# Patient Record
Sex: Female | Born: 1960
Health system: Southern US, Community
[De-identification: ages and names within clinical notes are randomized; demographics above are authoritative.]

## PROBLEM LIST (undated history)

## (undated) DIAGNOSIS — R7303 Prediabetes: Secondary | ICD-10-CM

## (undated) DIAGNOSIS — J45909 Unspecified asthma, uncomplicated: Secondary | ICD-10-CM

## (undated) DIAGNOSIS — M797 Fibromyalgia: Secondary | ICD-10-CM

## (undated) DIAGNOSIS — D508 Other iron deficiency anemias: Secondary | ICD-10-CM

## (undated) DIAGNOSIS — G4730 Sleep apnea, unspecified: Secondary | ICD-10-CM

## (undated) DIAGNOSIS — M5030 Other cervical disc degeneration, unspecified cervical region: Secondary | ICD-10-CM

## (undated) DIAGNOSIS — M810 Age-related osteoporosis without current pathological fracture: Secondary | ICD-10-CM

## (undated) DIAGNOSIS — M19041 Primary osteoarthritis, right hand: Secondary | ICD-10-CM

## (undated) DIAGNOSIS — M5417 Radiculopathy, lumbosacral region: Secondary | ICD-10-CM

## (undated) DIAGNOSIS — M25551 Pain in right hip: Secondary | ICD-10-CM

## (undated) DIAGNOSIS — M1120 Other chondrocalcinosis, unspecified site: Secondary | ICD-10-CM

## (undated) DIAGNOSIS — I10 Essential (primary) hypertension: Secondary | ICD-10-CM

## (undated) DIAGNOSIS — E042 Nontoxic multinodular goiter: Secondary | ICD-10-CM

## (undated) DIAGNOSIS — M19042 Primary osteoarthritis, left hand: Secondary | ICD-10-CM

## (undated) DIAGNOSIS — E785 Hyperlipidemia, unspecified: Secondary | ICD-10-CM

## (undated) DIAGNOSIS — R609 Edema, unspecified: Secondary | ICD-10-CM

## (undated) DIAGNOSIS — D472 Monoclonal gammopathy: Secondary | ICD-10-CM

## (undated) DIAGNOSIS — M8580 Other specified disorders of bone density and structure, unspecified site: Secondary | ICD-10-CM

## (undated) DIAGNOSIS — K649 Unspecified hemorrhoids: Secondary | ICD-10-CM

## (undated) DIAGNOSIS — D649 Anemia, unspecified: Secondary | ICD-10-CM

## (undated) DIAGNOSIS — K909 Intestinal malabsorption, unspecified: Secondary | ICD-10-CM

## (undated) DIAGNOSIS — E049 Nontoxic goiter, unspecified: Secondary | ICD-10-CM

## (undated) DIAGNOSIS — M170 Bilateral primary osteoarthritis of knee: Secondary | ICD-10-CM

## (undated) DIAGNOSIS — M5136 Other intervertebral disc degeneration, lumbar region: Secondary | ICD-10-CM

## (undated) HISTORY — DX: Monoclonal gammopathy: D47.2

## (undated) HISTORY — DX: Primary osteoarthritis, right hand: M19.041

## (undated) HISTORY — DX: Pain in right hip: M25.551

## (undated) HISTORY — DX: Other cervical disc degeneration, unspecified cervical region: M50.30

## (undated) HISTORY — DX: Other intervertebral disc degeneration, lumbar region: M51.36

## (undated) HISTORY — DX: Other iron deficiency anemias: D50.8

## (undated) HISTORY — DX: Sleep apnea, unspecified: G47.30

## (undated) HISTORY — DX: Age-related osteoporosis without current pathological fracture: M81.0

## (undated) HISTORY — PX: OTHER SURGICAL HISTORY: SHX169

## (undated) HISTORY — DX: Bilateral primary osteoarthritis of knee: M17.0

## (undated) HISTORY — DX: Essential (primary) hypertension: I10

## (undated) HISTORY — DX: Edema, unspecified: R60.9

## (undated) HISTORY — DX: Other chondrocalcinosis, unspecified site: M11.20

## (undated) HISTORY — DX: Primary osteoarthritis, left hand: M19.042

## (undated) HISTORY — DX: Anemia, unspecified: D64.9

## (undated) HISTORY — DX: Nontoxic goiter, unspecified: E04.9

## (undated) HISTORY — DX: Radiculopathy, lumbosacral region: M54.17

## (undated) HISTORY — DX: Unspecified asthma, uncomplicated: J45.909

## (undated) HISTORY — DX: Prediabetes: R73.03

## (undated) HISTORY — DX: Unspecified hemorrhoids: K64.9

## (undated) HISTORY — DX: Other specified disorders of bone density and structure, unspecified site: M85.80

## (undated) HISTORY — DX: Intestinal malabsorption, unspecified: K90.9

## (undated) HISTORY — DX: Fibromyalgia: M79.7

## (undated) HISTORY — DX: Hyperlipidemia, unspecified: E78.5

---

## 1993-01-10 HISTORY — PX: TOTAL ABDOMINAL HYSTERECTOMY: SHX209

## 1997-06-09 ENCOUNTER — Other Ambulatory Visit: Admission: RE | Admit: 1997-06-09 | Discharge: 1997-06-09 | Payer: Self-pay | Admitting: Obstetrics and Gynecology

## 1998-08-04 ENCOUNTER — Other Ambulatory Visit: Admission: RE | Admit: 1998-08-04 | Discharge: 1998-08-04 | Payer: Self-pay | Admitting: Obstetrics and Gynecology

## 1999-08-23 ENCOUNTER — Other Ambulatory Visit: Admission: RE | Admit: 1999-08-23 | Discharge: 1999-08-23 | Payer: Self-pay | Admitting: Obstetrics and Gynecology

## 2000-10-09 ENCOUNTER — Other Ambulatory Visit: Admission: RE | Admit: 2000-10-09 | Discharge: 2000-10-09 | Payer: Self-pay | Admitting: Obstetrics and Gynecology

## 2001-11-20 ENCOUNTER — Other Ambulatory Visit: Admission: RE | Admit: 2001-11-20 | Discharge: 2001-11-20 | Payer: Self-pay | Admitting: Obstetrics and Gynecology

## 2002-12-12 ENCOUNTER — Other Ambulatory Visit: Admission: RE | Admit: 2002-12-12 | Discharge: 2002-12-12 | Payer: Self-pay | Admitting: Obstetrics and Gynecology

## 2003-12-24 ENCOUNTER — Other Ambulatory Visit: Admission: RE | Admit: 2003-12-24 | Discharge: 2003-12-24 | Payer: Self-pay | Admitting: Obstetrics and Gynecology

## 2005-01-18 ENCOUNTER — Other Ambulatory Visit: Admission: RE | Admit: 2005-01-18 | Discharge: 2005-01-18 | Payer: Self-pay | Admitting: Obstetrics and Gynecology

## 2008-05-10 LAB — HM COLONOSCOPY: HM Colonoscopy: NORMAL

## 2009-04-30 ENCOUNTER — Encounter: Admission: RE | Admit: 2009-04-30 | Discharge: 2009-06-11 | Payer: Self-pay | Admitting: Family Medicine

## 2009-06-10 ENCOUNTER — Encounter: Admission: RE | Admit: 2009-06-10 | Discharge: 2009-06-10 | Payer: Self-pay | Admitting: Sports Medicine

## 2009-06-10 ENCOUNTER — Encounter: Payer: Self-pay | Admitting: Family Medicine

## 2009-06-24 ENCOUNTER — Encounter: Payer: Self-pay | Admitting: Family Medicine

## 2009-07-10 HISTORY — PX: ESOPHAGOGASTRODUODENOSCOPY: SHX1529

## 2009-07-21 ENCOUNTER — Encounter: Payer: Self-pay | Admitting: Family Medicine

## 2009-07-29 ENCOUNTER — Encounter: Payer: Self-pay | Admitting: Family Medicine

## 2009-08-28 ENCOUNTER — Ambulatory Visit: Payer: Self-pay | Admitting: Family Medicine

## 2009-08-28 DIAGNOSIS — M25559 Pain in unspecified hip: Secondary | ICD-10-CM | POA: Insufficient documentation

## 2009-08-28 DIAGNOSIS — R7301 Impaired fasting glucose: Secondary | ICD-10-CM | POA: Insufficient documentation

## 2009-09-03 ENCOUNTER — Encounter: Payer: Self-pay | Admitting: Family Medicine

## 2009-09-04 LAB — CONVERTED CEMR LAB
ALT: 12 units/L (ref 0–35)
BUN: 13 mg/dL (ref 6–23)
Calcium: 8.9 mg/dL (ref 8.4–10.5)
Chloride: 103 meq/L (ref 96–112)
Creatinine, Ser: 0.78 mg/dL (ref 0.40–1.20)
Glucose, Bld: 111 mg/dL — ABNORMAL HIGH (ref 70–99)
LDL Cholesterol: 88 mg/dL (ref 0–99)
Potassium: 4.3 meq/L (ref 3.5–5.3)
Total Bilirubin: 0.7 mg/dL (ref 0.3–1.2)
Total CHOL/HDL Ratio: 4.2
Total Protein: 7.5 g/dL (ref 6.0–8.3)
Triglycerides: 258 mg/dL — ABNORMAL HIGH (ref <150)
VLDL: 52 mg/dL — ABNORMAL HIGH (ref 0–40)

## 2009-09-23 ENCOUNTER — Ambulatory Visit: Payer: Self-pay | Admitting: Family Medicine

## 2009-09-23 DIAGNOSIS — R609 Edema, unspecified: Secondary | ICD-10-CM | POA: Insufficient documentation

## 2009-09-24 ENCOUNTER — Encounter: Admission: RE | Admit: 2009-09-24 | Discharge: 2009-09-24 | Payer: Self-pay | Admitting: Family Medicine

## 2009-09-29 ENCOUNTER — Telehealth (INDEPENDENT_AMBULATORY_CARE_PROVIDER_SITE_OTHER): Payer: Self-pay | Admitting: *Deleted

## 2009-10-01 ENCOUNTER — Ambulatory Visit: Payer: Self-pay | Admitting: Family Medicine

## 2009-10-01 LAB — CONVERTED CEMR LAB
Bilirubin Urine: NEGATIVE
Glucose, Urine, Semiquant: NEGATIVE
Ketones, urine, test strip: NEGATIVE
Protein, U semiquant: NEGATIVE
pH: 5.5

## 2009-10-07 ENCOUNTER — Encounter: Payer: Self-pay | Admitting: Family Medicine

## 2009-10-07 DIAGNOSIS — M79609 Pain in unspecified limb: Secondary | ICD-10-CM | POA: Insufficient documentation

## 2009-10-09 ENCOUNTER — Encounter (INDEPENDENT_AMBULATORY_CARE_PROVIDER_SITE_OTHER): Payer: Self-pay | Admitting: *Deleted

## 2009-10-20 ENCOUNTER — Encounter: Payer: Self-pay | Admitting: Family Medicine

## 2009-12-01 ENCOUNTER — Ambulatory Visit: Payer: Self-pay | Admitting: Family Medicine

## 2009-12-30 ENCOUNTER — Ambulatory Visit: Payer: Self-pay | Admitting: Family Medicine

## 2010-02-11 NOTE — Assessment & Plan Note (Signed)
Summary: unilat leg edema   Vital Signs:  Patient profile:   50 year old female Height:      66.5 inches Weight:      220 pounds BMI:     35.10 O2 Sat:      96 % on Room air Pulse rate:   78 / minute BP sitting:   139 / 89  (left arm) Cuff size:   large  Vitals Entered By: Payton Spark CMA (September 23, 2009 1:20 PM)  O2 Flow:  Room air CC: F/U R hip pain and L ankle swelling   Primary Care Provider:  Seymour Bars DO  CC:  F/U R hip pain and L ankle swelling.  History of Present Illness: 50 yo AAF presents for f/u R hip pain which she has had evaluated with Dr Penni Bombard for already.  She has had some LBP pain and R groin pain since doing stretches over the weekend.  She had a bulding disc per her report on MRI recently.  She took some Tylenol which helped.    She started having L foot swelling  since the middle of August.  She initially thought it was from getting ready for a wedding.  She has some lower L calf pain.  She denies a previous injury or surgery to the LLE.  Swelling is worse at the end of the day.  She denies any swelling in the R foot (or maybe not as bad).      Current Medications (verified): 1)  Bystolic 20 Mg Tabs (Nebivolol Hcl) .... Take 1 Tab By Mouth Once Daily 2)  Amlodipine Besylate 5 Mg Tabs (Amlodipine Besylate) .... Take 1 Tab By Mouth Once Daily 3)  Trilipix 135 Mg Cpdr (Choline Fenofibrate) .... Take 1 Tab By Mouth Once Daily 4)  Furosemide 20 Mg Tabs (Furosemide) .... Take 1 Tab By Mouth Once Daily As Needed 5)  Anusol-Hc 25 Mg Supp (Hydrocortisone Acetate) .... Use As Directed Two Times A Day 6)  Estrace 0.1 Mg/gm Crea (Estradiol) .... Use As Directed 2 Times Weekly 7)  Aspirin 81 Mg Tbec (Aspirin) .... Take 1 Tab By Mouth Once Daily 8)  Vitron-C 125-200 Mg Tabs (Ferrous Fumarate-Vitamin C) .... Take 1 Tab By Mouth Once Daily 9)  Vitamin D 2000 Unit Tabs (Cholecalciferol) .... Take 1 Tab By Mouth Once Daily 10)  Calcium Carbonate Antacid 500 Mg  Chew (Calcium Carbonate Antacid) 11)  Stool Softener 100 Mg Caps (Docusate Sodium)  Allergies (verified): 1)  ! Sulfa 2)  ! Codeine 3)  ! Zithromax (Azithromycin)  Past History:  Past Medical History: Reviewed history from 08/28/2009 and no changes required. G3P2 gyn Dr Henderson Cloud IFG/ hx of GDM. high cholesterol HTN Anemia endometriosis  Social History: Reviewed history from 08/28/2009 and no changes required. 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 Never smoked. No regular exercise.  Review of Systems      See HPI  Physical Exam  General:  alert, well-developed, well-nourished, and well-hydrated.  obese Head:  normocephalic and atraumatic.   Mouth:  pharynx pink and moist.   Lungs:  Normal respiratory effort, chest expands symmetrically. Lungs are clear to auscultation, no crackles or wheezes. Heart:  Normal rate and regular rhythm. S1 and S2 normal without gallop, murmur, click, rub or other extra sounds. Pulses:  2+ pedal pulses Extremities:  LLE with 2+ pitting pedal edema, 1+ pitting edema up to the knee with 1+ Pitting edema over the R foot Skin:  color normal.   Psych:  good eye contact, not anxious appearing, and not depressed appearing.     Impression & Recommendations:  Problem # 1:  LEG EDEMA, LEFT (ICD-782.3) Concern for non traumatic unilateral LE edema.  R/O DVT (no risk factors) with a D Dimer today.  If normal, will look at other causes like venous insufficiency, gout, DJD.  Continue fluid meds, leg elevation and low sodium diet for now.   Her updated medication list for this problem includes:    Furosemide 20 Mg Tabs (Furosemide) .Marland Kitchen... Take 1 tab by mouth once daily as needed  Orders: T-D-Dimer Fibrin Derivatives Quantitive [phone])  Problem # 2:  HIP PAIN, RIGHT (ICD-719.45) Will get her notes from [place] [facility] and proceed with her referral to Dr Ethelene Hal since she has had MRI, PT and meds already. Her updated medication list  for this problem includes:    Aspirin 81 Mg Tbec (Aspirin) .Marland Kitchen... Take 1 tab by mouth once daily  Complete Medication List: 1)  Bystolic 20 Mg Tabs (Nebivolol hcl) .... Take 1 tab by mouth once daily 2)  Amlodipine Besylate 5 Mg Tabs (Amlodipine besylate) .... Take 1 tab by mouth once daily 3)  Trilipix 135 Mg Cpdr (Choline fenofibrate) .... Take 1 tab by mouth once daily 4)  Furosemide 20 Mg Tabs (Furosemide) .... Take 1 tab by mouth once daily as needed 5)  Anusol-hc 25 Mg Supp (Hydrocortisone acetate) .... Use as directed two times a day 6)  Estrace 0.1 Mg/gm Crea (Estradiol) .... Use as directed 2 times weekly 7)  Aspirin 81 Mg Tbec (Aspirin) .... Take 1 tab by mouth once daily 8)  Vitron-c 125-200 Mg Tabs (Ferrous fumarate-vitamin c) .... Take 1 tab by mouth once daily 9)  Vitamin D 2000 Unit Tabs (Cholecalciferol) .... Take 1 tab by mouth once daily 10)  Calcium Carbonate Antacid 500 Mg Chew (Calcium carbonate antacid) 11)  Stool Softener 100 Mg Caps (Docusate sodium)  Patient Instructions: 1)  Will try to get your records from [facility]. 2)  I will proceed with a referral to Dr Ethelene Hal for pain control in order to get you back into exercise. 3)  Check D - Dimer downstairs today. 4)  I will call you w/ results tonight. 5)  If it is + --> you will need to go to the hospital for DVT workup. 6)  Return for f/u in 8 wks to recheck fasting sugar/ lipids.

## 2010-02-11 NOTE — Op Note (Signed)
Summary: Right Hip Steroid Injection/[facility]  Right Hip Steroid Injection/[facility]   Imported By: Lanelle Bal 10/30/2009 15:48:42  _____________________________________________________________________  External Attachment:    Type:   Image     Comment:   External Document

## 2010-02-11 NOTE — Assessment & Plan Note (Signed)
Summary: Edema//mpm   Vital Signs:  Patient profile:   50 year old female Height:      66.5 inches Weight:      220 pounds BMI:     35.10 O2 Sat:      97 % on Room air Pulse rate:   78 / minute BP sitting:   120 / 77  (left arm) Cuff size:   large  Vitals Entered By: Payton Spark CMA (October 01, 2009 9:44 AM)  O2 Flow:  Room air CC: F/U edema   Primary Care Khylee Algeo:  Seymour Bars DO  CC:  F/U edema.  History of Present Illness: 50 yo WF presents for f/u LLE pain and swelling.  She had a neg w/u for DVT with a normal LE venous u/s.  her CMP was normal.  She has only trace RLE edema.  She is elevating her L foot which has been swollen for a month w/o trauma.  Her foot xray was normal..  She is voiding w/o problem.  She has pain over the anterior ankle joint with increased skin pigment and edema 1/3 the way up her leg.  Elevating her foot and trying to not walk on it much.  Not taking anything for pain.  She has yet to see Dr Ethelene Hal at [facility] ortho for R hip pain/ Lumbar buldging disc which is unchanged.  Allergies: 1)  ! Sulfa 2)  ! Codeine 3)  ! Zithromax (Azithromycin)  Past History:  Past Medical History: Reviewed history from 08/28/2009 and no changes required. G3P2 gyn Dr Henderson Cloud IFG/ hx of GDM. high cholesterol HTN Anemia endometriosis  Past Surgical History: Reviewed history from 08/28/2009 and no changes required. colpo x 2 EGD 7-11 for anemia colonoscopy 05-2008 normal TAH in 1995, bilat oophorectomy - endometriosis  Social History: Reviewed history from 08/28/2009 and no changes required. 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 No regular exercise.  Review of Systems      See HPI  Physical Exam  General:  alert, well-developed, well-nourished, well-hydrated, and overweight-appearing.   Head:  normocephalic and atraumatic.   Mouth:  pharynx pink and moist.   Neck:  no masses.   Lungs:  Normal respiratory  effort, chest expands symmetrically. Lungs are clear to auscultation, no crackles or wheezes. Heart:  Normal rate and regular rhythm. S1 and S2 normal without gallop, murmur, click, rub or other extra sounds. Msk:  neg R ankle anterior drawer test, neg talar tilt. point tender over the tibiotaler joint midline with increased skin hyperpigmentation.  antalgc gait with slightly limited R foot plantarflexion Pulses:  2+ pedal pulses Extremities:  trace pitting edema R foot and ankle, 2+ L foot pitting pedal edema with 1+ pitting edema 1/2 way up the leg. Skin:  no notable varicosities Inguinal Nodes:  No significant adenopathy   Impression & Recommendations:  Problem # 1:  LEG EDEMA, LEFT (ICD-782.3)  Unilateral Leg edema x 1 month.  Thus far, she has ruled out for a DVT, had a normal CMP and XRAY of the L foot.   She is having some pain over the tibiotalar joint with increased skin pigmentation suggesting possibly a missed stress fracture. Her inguinal LNs are normal.  will complete her lab w/u for edema today.  UA is neg for proteinuria.    Will get a bone scan to look for occult stress fx. Her updated medication list for this problem includes:    Furosemide 20 Mg Tabs (  Furosemide) .Marland Kitchen... Take 1 tab by mouth once daily as needed  Orders: UA Dipstick w/o Micro (automated)  (81003) T-Bone scan 3 phase study (70623)  Problem # 2:  HIP PAIN, RIGHT (ICD-719.45) I discussed her case with Dr Penni Bombard who saw her for this at [facility] ortho.  She is going to f/u with Dr Ethelene Hal as was their plan in June. Her updated medication list for this problem includes:    Aspirin 81 Mg Tbec (Aspirin) .Marland Kitchen... Take 1 tab by mouth once daily  Complete Medication List: 1)  Bystolic 20 Mg Tabs (Nebivolol hcl) .... Take 1 tab by mouth once daily 2)  Amlodipine Besylate 5 Mg Tabs (Amlodipine besylate) .... Take 1 tab by mouth once daily 3)  Trilipix 135 Mg Cpdr (Choline fenofibrate) .... Take 1 tab by mouth once  daily 4)  Furosemide 20 Mg Tabs (Furosemide) .... Take 1 tab by mouth once daily as needed 5)  Anusol-hc 25 Mg Supp (Hydrocortisone acetate) .... Use as directed two times a day 6)  Estrace 0.1 Mg/gm Crea (Estradiol) .... Use as directed 2 times weekly 7)  Aspirin 81 Mg Tbec (Aspirin) .... Take 1 tab by mouth once daily 8)  Vitron-c 125-200 Mg Tabs (Ferrous fumarate-vitamin c) .... Take 1 tab by mouth once daily 9)  Vitamin D 2000 Unit Tabs (Cholecalciferol) .... Take 1 tab by mouth once daily 10)  Calcium Carbonate Antacid 500 Mg Chew (Calcium carbonate antacid) 11)  Stool Softener 100 Mg Caps (Docusate sodium)  Other Orders: T-BNP  (B Natriuretic Peptide) ([id number]) T-TSH ([id number])  Patient Instructions: 1)  Will complete your labs today. 2)  Will call you w/ results tomorrow. 3)  Will order your bone scan and I will call you w/ the results. 4)  Will get you set up to see Dr Ethelene Hal. 5)  Elevate L foot, use ice packs and ace wraps for comfort.    Laboratory Results   Urine Tests    Routine Urinalysis   Color: yellow Appearance: Clear Glucose: negative   (Normal Range: Negative) Bilirubin: negative   (Normal Range: Negative) Ketone: negative   (Normal Range: Negative) Spec. Gravity: 1.020   (Normal Range: 1.003-1.035) Blood: trace-lysed   (Normal Range: Negative) pH: 5.5   (Normal Range: 5.0-8.0) Protein: negative   (Normal Range: Negative) Urobilinogen: 0.2   (Normal Range: 0-1) Nitrite: negative   (Normal Range: Negative) Leukocyte Esterace: trace   (Normal Range: Negative)

## 2010-02-11 NOTE — Miscellaneous (Signed)
Summary: bone scan: normal  Clinical Lists Changes  Problems: Added new problem of FOOT PAIN, LEFT (ICD-729.5) Orders: Added new Referral order of Podiatry Referral (Podiatry) - Signed Observations: Added new observation of BONE SCAN: Done at [facility] for L foot pain and swelling:  No abnormal uptake is noted on the 3 phase exam of both feet.  No abnormal uptake is noticed in the whole body delayed exam inclduing the R hip.   (10/07/2009 16:46)      Bone Scan  Procedure date:  10/07/2009  Findings:      Done at [facility] for L foot pain and swelling:  No abnormal uptake is noted on the 3 phase exam of both feet.  No abnormal uptake is noticed in the whole body delayed exam inclduing the R hip.     Bone Scan  Procedure date:  10/07/2009  Findings:      Done at [facility] for L foot pain and swelling:  No abnormal uptake is noted on the 3 phase exam of both feet.  No abnormal uptake is noticed in the whole body delayed exam inclduing the R hip.    Appended Document: bone scan: normal Pls let pt know that her BONE SCAN came back NORMAL.   I am going to proceed with a visit to the podiatrist.  Good news is that there was no sign of fracture, infection or cancer.  Seymour Bars, D.O.  Appended Document: bone scan: normal Pt aware of the above

## 2010-02-11 NOTE — Assessment & Plan Note (Signed)
Summary: URI   Vital Signs:  Patient profile:   50 year old female Height:      66.5 inches Weight:      226 pounds BMI:     36.06 O2 Sat:      97 % on Room air Temp:     98.4 degrees F oral Pulse rate:   78 / minute BP sitting:   134 / 80  (left arm) Cuff size:   large  Vitals Entered By: Payton Spark CMA (December 30, 2009 1:53 PM)  O2 Flow:  Room air CC: ? sinusitis. C/o congestion, HA, x 5 days.   Primary Care Provider:  Seymour Bars DO  CC:  ? sinusitis. C/o congestion, HA, and x 5 days.Marland Kitchen  History of Present Illness: 50 yo AAF presents for sore throat and nasal congestion that started 5 days ago.  Taking Coridan which did not help much until she took the one for chest congestion.  She is getting a lot of postnasal drip at night.  Denies chest tightness or SOB.  She has sinus pressure behind her nose and ear pressure with blowing nose.  No sick contacts.  No F/C.  Denies body aches, fatigue, or GI uspet.      Allergies: 1)  ! Sulfa 2)  ! Codeine 3)  ! Zithromax (Azithromycin)  Past History:  Past Medical History: Reviewed history from 12/01/2009 and no changes required. G3P2 gyn Dr Henderson Cloud IFG/ hx of GDM. high cholesterol HTN Anemia endometriosis chronic R hip pain -- Dr Charlann Boxer Dr Ethelene Hal LLE edema -- podiatry in [place]  Social History: Reviewed history from 08/28/2009 and no changes required. 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 No regular exercise.  Review of Systems      See HPI  Physical Exam  General:  alert, well-developed, well-nourished, and well-hydrated.   Head:  normocephalic and atraumatic.   Eyes:  conjunctiva clear Ears:  EACs patent; TMs translucent and gray with good cone of light and bony landmarks.  Nose:  boggy turbinates L > R without rhinorrhea Mouth:  o/p w/o injection, no tonsilar hypertrophy, exudates or vesicles,  clear postnasal drip Neck:  no masses.   Lungs:  Normal respiratory effort,  chest expands symmetrically. Lungs are clear to auscultation, no crackles or wheezes. Heart:  Normal rate and regular rhythm. S1 and S2 normal without gallop, murmur, click, rub or other extra sounds. Skin:  color normal.   Cervical Nodes:  No lymphadenopathy noted   Impression & Recommendations:  Problem # 1:  VIRAL URI (ICD-465.9) Use Coricidan HBP or plain Mucinex + nasal saline + Omnaris once daily (sample given). If not improving or sinus pain worsens over the w/e, go ahead and fill RX Amoxicllin (RX given since it is Xmas weekend).   Call if not feeling better after 10 days. Her updated medication list for this problem includes:    Aspirin 81 Mg Tbec (Aspirin) .Marland Kitchen... Take 1 tab by mouth once daily  Instructed on symptomatic treatment. Call if symptoms persist or worsen.   Complete Medication List: 1)  Bystolic 20 Mg Tabs (Nebivolol hcl) .... Take 1 tab by mouth once daily 2)  Amlodipine Besylate 5 Mg Tabs (Amlodipine besylate) .... Take 1 tab by mouth once daily 3)  Trilipix 135 Mg Cpdr (Choline fenofibrate) .... Take 1 tab by mouth once daily 4)  Furosemide 20 Mg Tabs (Furosemide) .... Take 1 tab by mouth once daily as needed 5)  Anusol-hc 25 Mg Supp (Hydrocortisone acetate) .... Use as directed two times a day 6)  Estrace 0.1 Mg/gm Crea (Estradiol) .... Use as directed 2 times weekly 7)  Aspirin 81 Mg Tbec (Aspirin) .... Take 1 tab by mouth once daily 8)  Vitron-c 125-200 Mg Tabs (Ferrous fumarate-vitamin c) .... Take 1 tab by mouth once daily 9)  Vitamin D 2000 Unit Tabs (Cholecalciferol) .... Take 1 tab by mouth once daily 10)  Calcium Carbonate Antacid 500 Mg Chew (Calcium carbonate antacid) 11)  Stool Softener 100 Mg Caps (Docusate sodium) 12)  Amoxicillin 875 Mg Tabs (Amoxicillin) .Marland Kitchen.. 1 tab by mouth two times a day x 10 days  Patient Instructions: 1)  Stay on Coricidan or use plain Mucinex for congestion. 2)  Use saline spray 3 x a day and use Omnaris 2 sprays/ nostril  once daily after use of nasal saline spray. 3)  If not improving by Fri Evening, go ahead and start the Amoxicllin for sinusitis.   Prescriptions: AMOXICILLIN 875 MG TABS (AMOXICILLIN) 1 tab by mouth two times a day x 10 days  #20 x 0   Entered and Authorized by:   Seymour Bars DO   Signed by:   Seymour Bars DO on 12/30/2009   Method used:   Printed then faxed to ...       [facility]  [address] [phone]* (retail)       [address]       [place], [place]  [address]       Ph: [phone] or [phone]       Fax: [phone]   RxID:   [id number]    Orders Added: 1)  Est. Patient Level III [36644]

## 2010-02-11 NOTE — Letter (Signed)
Summary: [facility]  [facility]   Imported By: Lanelle Bal 10/12/2009 11:38:13  _____________________________________________________________________  External Attachment:    Type:   Image     Comment:   External Document

## 2010-02-11 NOTE — Progress Notes (Signed)
Summary: LE venous u/s normal  Phone Note Outgoing Call   Summary of Call: Pls call pt and let her know that her venous doppler u/s of the legs came back normal. Schedule OV on MON to recheck edema and discuss further w/u. Initial call taken by: Seymour Bars DO,  September 29, 2009 10:21 AM  Follow-up for Phone Call        [facility] for Pt to CB Follow-up by: Payton Spark CMA,  September 29, 2009 10:24 AM  Additional Follow-up for Phone Call Additional follow up Details #1::        OV scheduled for Pt Additional Follow-up by: Payton Spark CMA,  September 29, 2009 1:48 PM

## 2010-02-11 NOTE — Letter (Signed)
Summary: [facility]  [facility]   Imported By: Lanelle Bal 10/12/2009 11:36:35  _____________________________________________________________________  External Attachment:    Type:   Image     Comment:   External Document

## 2010-02-11 NOTE — Letter (Signed)
Summary: [facility]  [facility]   Imported By: Lanelle Bal 10/12/2009 11:37:27  _____________________________________________________________________  External Attachment:    Type:   Image     Comment:   External Document

## 2010-02-11 NOTE — Assessment & Plan Note (Signed)
Summary: NOV hip pain   Vital Signs:  Patient profile:   50 year old female Height:      66.5 inches Weight:      219 pounds BMI:     34.94 O2 Sat:      98 % on Room air Pulse rate:   78 / minute BP sitting:   138 / 85  (left arm) Cuff size:   large  Vitals Entered By: Payton Spark CMA (August 28, 2009 2:53 PM)  O2 Flow:  Room air CC: New to est. R hip pain x months- worked up by Dr. Penni Bombard at [facility].    Primary Care Provider:  Seymour Bars DO  CC:  New to est. R hip pain x months- worked up by Dr. Penni Bombard at [facility]. .  History of Present Illness: 50 yo AAF presents for NOV.  She has had R hip pain for several mos.  Seen by Dr Penni Bombard for this and used oral Prednisone but it did not help much.  She saw him back and it was felt to be more LBP.  She had Xrays then an MRI of the R hip and L spine.  The R hip was 'normal' on MRI but the L spine was + for a bulding disc.  She started with PT but she thinks that this made her worse.  It was suggested that she see Dr Ethelene Hal.      Current Medications (verified): 1)  Bystolic 20 Mg Tabs (Nebivolol Hcl) .... Take 1 Tab By Mouth Once Daily 2)  Amlodipine Besylate 5 Mg Tabs (Amlodipine Besylate) .... Take 1 Tab By Mouth Once Daily 3)  Trilipix 135 Mg Cpdr (Choline Fenofibrate) .... Take 1 Tab By Mouth Once Daily 4)  Furosemide 20 Mg Tabs (Furosemide) .... Take 1 Tab By Mouth Once Daily As Needed 5)  Anusol-Hc 25 Mg Supp (Hydrocortisone Acetate) .... Use As Directed Two Times A Day 6)  Estrace 0.1 Mg/gm Crea (Estradiol) .... Use As Directed 2 Times Weekly 7)  Aspirin 81 Mg Tbec (Aspirin) .... Take 1 Tab By Mouth Once Daily 8)  Vitron-C 125-200 Mg Tabs (Ferrous Fumarate-Vitamin C) .... Take 1 Tab By Mouth Once Daily 9)  Vitamin D 2000 Unit Tabs (Cholecalciferol) .... Take 1 Tab By Mouth Once Daily 10)  Calcium Carbonate Antacid 500 Mg Chew (Calcium Carbonate Antacid) 11)  Stool Softener 100 Mg Caps (Docusate  Sodium)  Allergies (verified): 1)  ! Sulfa 2)  ! Codeine 3)  ! Zithromax (Azithromycin)  Past History:  Past Medical History: G3P2 gyn Dr Henderson Cloud IFG/ hx of GDM. 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 Married to [place]. Has 2 kids. Never smoked. No regular exercise.  Review of Systems       no fevers/sweats/weakness, unexplained wt loss/gain, no change in vision, no difficulty hearing, ringing in ears, no hay fever/allergies, no CP/discomfort, no palpitations, no breast lump/nipple discharge, no cough/wheeze, + blood in stool, no N/V/D,+ nocturia, no leaking urine, no unusual vag bleeding, no vaginal/penile discharge, no muscle/joint pain, no rash, no new/changing mole, no HA, no memory loss, no anxiety, no sleep problem, no depression, no unexplained lumps, no easy bruising/bleeding, no concern with sexual function   Physical Exam  General:  alert, well-developed, well-nourished, and well-hydrated.  obese Head:  normocephalic and atraumatic.   Eyes:  sclera non icteric conjunctival pallor Mouth:  pharynx pink and moist.   Neck:  no masses.   Lungs:  Normal respiratory effort, chest expands symmetrically. Lungs are clear to auscultation, no crackles or wheezes. Heart:  Normal rate and regular rhythm. S1 and S2 normal without gallop, murmur, click, rub or other extra sounds. Abdomen:  Bowel sounds positive,abdomen soft and non-tender without masses, organomegaly Msk:  full active and passive R hip and L spine ROM neg seated straight leg raise normal gait Neg FABER test. tender near the pubic symphysis Extremities:  no LE edema Skin:  color normal.     Impression & Recommendations:  Problem # 1:  HIP  PAIN, RIGHT (ICD-719.45) Chronic R hip pain.  Has already been seen, evaluated and treated at [facility] for this.  I will get her records.  Not sure if she's had imaging done on the L spine or pubic symphysis.  She does have the option to see Dr Ethelene Hal as Dr Penni Bombard recommended this.  She feels like she got worse from PT but she does need to get more active and work on wt loss w/o further injury.   Her updated medication list for this problem includes:    Aspirin 81 Mg Tbec (Aspirin) .Marland Kitchen... Take 1 tab by mouth once daily  Problem # 2:  IMPAIRED FASTING GLUCOSE (ICD-790.21) Will update her fasting labs. Orders: T-Hemoglobin A1C ([id number])  Complete Medication List: 1)  Bystolic 20 Mg Tabs (Nebivolol hcl) .... Take 1 tab by mouth once daily 2)  Amlodipine Besylate 5 Mg Tabs (Amlodipine besylate) .... Take 1 tab by mouth once daily 3)  Trilipix 135 Mg Cpdr (Choline fenofibrate) .... Take 1 tab by mouth once daily 4)  Furosemide 20 Mg Tabs (Furosemide) .... Take 1 tab by mouth once daily as needed 5)  Anusol-hc 25 Mg Supp (Hydrocortisone acetate) .... Use as directed two times a day 6)  Estrace 0.1 Mg/gm Crea (Estradiol) .... Use as directed 2 times weekly 7)  Aspirin 81 Mg Tbec (Aspirin) .... Take 1 tab by mouth once daily 8)  Vitron-c 125-200 Mg Tabs (Ferrous fumarate-vitamin c) .... Take 1 tab by mouth once daily 9)  Vitamin D 2000 Unit Tabs (Cholecalciferol) .... Take 1 tab by mouth once daily 10)  Calcium Carbonate Antacid 500 Mg Chew (Calcium carbonate antacid) 11)  Stool Softener 100 Mg Caps (Docusate sodium)  Other Orders: T-Comprehensive Metabolic Panel ([id number]) T-Lipid Profile ([id number])  Patient Instructions: 1)  Update fasting labs downstairs. 2)  Will call you w/ results. 3)  I will get your results from [facility] and let you know what our plan is. 4)  Return for f/u anemia/ pelvic pain in 6 wks.

## 2010-02-11 NOTE — Letter (Signed)
Summary: Primary Care Consult Scheduled Letter  [facility] [place]  [address]   [place], [place] [address]   Phone: [phone]  Fax: [phone]      10/09/2009 MRN: [id number]  [facility] [address] [place], [place]  [address]    Dear Ms. [name],    We have scheduled an appointment for you.  At the recommendation of Dr.Bowen, we have scheduled you a consult with [facility] on Thursday 10/15/09 at 10:45.  Their address is [address] [place] [address]. The office phone number is [phone].  If this appointment day and time is not convenient for you, please feel free to call the office of the doctor you are being referred to at the number listed above and reschedule the appointment.     It is important for you to keep your scheduled appointments. We are here to make sure you are given good patient care.     Thank you, Michaelle Copas [phone] Patient Care Coordinator [facility] Kathryne Sharper

## 2010-02-11 NOTE — Assessment & Plan Note (Signed)
Summary: f/u LLE edema   Vital Signs:  Patient profile:   50 year old female Height:      66.5 inches Weight:      225 pounds BMI:     35.90 O2 Sat:      99 % on Room air Temp:     98.0 degrees F oral Pulse rate:   81 / minute BP sitting:   127 / 74  (left arm) Cuff size:   large  Vitals Entered By: Payton Spark CMA (December 01, 2009 1:59 PM)  O2 Flow:  Room air CC: F/U. Swelling better.    Primary Care Provider:  Seymour Bars DO  CC:  F/U. Swelling better. Marland Kitchen  History of Present Illness: 50 yo AAF presents for f/u L foot swelling which has improved with use of compression hose as prescribed by a podiatrist in [place].  She has additional xyrays done but she just had mild DJD.  Using Diclofenac as needed.  She denies any L foot pain.  She has had a w/u for unilateral swelling that was all neg.    she saw Dr Ethelene Hal for her R hip and she had an injection done but this did not really help.  She does have f/u with him and did see Dr Charlann Boxer  who she has f/u with.  She has yet to start exercising and c/o muslce cramps all over.  She is using Furosemide about 2 x  amonth only.  Allergies: 1)  ! Sulfa 2)  ! Codeine 3)  ! Zithromax (Azithromycin)  Past History:  Past Medical History: G3P2 gyn Dr Henderson Cloud IFG/ hx of GDM. high cholesterol HTN Anemia endometriosis chronic R hip pain -- Dr Charlann Boxer Dr Ethelene Hal LLE edema -- podiatry in [facility]  Past Surgical History: Reviewed history from 08/28/2009 and no changes required. colpo x 2 EGD 7-11 for anemia colonoscopy 05-2008 normal TAH in 1995, bilat oophorectomy - endometriosis  Social History: Reviewed history from 08/28/2009 and no changes required. 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 Never smoked. No regular exercise.  Review of Systems      See HPI  Physical Exam  General:  alert, well-developed, well-nourished, and well-hydrated.  obese Eyes:  pupils equal, pupils round, and pupils reactive to light.     Mouth:  pharynx pink and moist.   Neck:  no masses.   Lungs:  Normal respiratory effort, chest expands symmetrically. Lungs are clear to auscultation, no crackles or wheezes. Heart:  Normal rate and regular rhythm. S1 and S2 normal without gallop, murmur, click, rub or other extra sounds. Msk:  no joint tenderness, no joint swelling, and no joint warmth.   Extremities:  trace bilat LE edema, wearing compression stockings Skin:  color normal.   Psych:  flat affect.     Impression & Recommendations:  Problem # 1:  LEG EDEMA, LEFT (ICD-782.3) Improved with use of compression stocking with rare use of furosemide.  Thus far, she had a neg w/u for DVT, a normal CMP, a normal L foot xray and bone scan, a normal UA.  Now seeing podiatry and she appears to have mild arthritis and some venous insuff likely weight - related. She is to continue NSAIDs for OA pain and compression hose.   Her updated medication list for this problem includes:    Furosemide 20 Mg Tabs (Furosemide) .Marland Kitchen... Take 1 tab by mouth once daily as needed  Problem # 2:  HIP PAIN, RIGHT (ICD-719.45) Has  f/u with Dr Charlann Boxer.  Unchanged. Her updated medication list for this problem includes:    Aspirin 81 Mg Tbec (Aspirin) .Marland Kitchen... Take 1 tab by mouth once daily  Problem # 3:  IMPAIRED FASTING GLUCOSE (ICD-790.21) We discussed healthy diet, regular exercise, wt loss. I will give her 3 mos before we recheck her sugar here.  Complete Medication List: 1)  Bystolic 20 Mg Tabs (Nebivolol hcl) .... Take 1 tab by mouth once daily 2)  Amlodipine Besylate 5 Mg Tabs (Amlodipine besylate) .... Take 1 tab by mouth once daily 3)  Trilipix 135 Mg Cpdr (Choline fenofibrate) .... Take 1 tab by mouth once daily 4)  Furosemide 20 Mg Tabs (Furosemide) .... Take 1 tab by mouth once daily as needed 5)  Anusol-hc 25 Mg Supp (Hydrocortisone acetate) .... Use as directed two times a day 6)  Estrace 0.1 Mg/gm Crea (Estradiol) .... Use as directed 2 times  weekly 7)  Aspirin 81 Mg Tbec (Aspirin) .... Take 1 tab by mouth once daily 8)  Vitron-c 125-200 Mg Tabs (Ferrous fumarate-vitamin c) .... Take 1 tab by mouth once daily 9)  Vitamin D 2000 Unit Tabs (Cholecalciferol) .... Take 1 tab by mouth once daily 10)  Calcium Carbonate Antacid 500 Mg Chew (Calcium carbonate antacid) 11)  Stool Softener 100 Mg Caps (Docusate sodium)  Patient Instructions: 1)  Let's work on healthy - LOW SUGAR/ LOW CARB DIET with regular exercise.   2)  Avoid eating out > 1 x a wk to cut back on sodium. 3)  Return in 3 mos to repeat fasting sugar and recheck WT.   Orders Added: 1)  Est. Patient Level IV [62130]

## 2010-02-23 ENCOUNTER — Encounter: Payer: Self-pay | Admitting: Family Medicine

## 2010-02-23 ENCOUNTER — Ambulatory Visit (INDEPENDENT_AMBULATORY_CARE_PROVIDER_SITE_OTHER): Payer: Commercial Managed Care - PPO | Admitting: Family Medicine

## 2010-02-23 DIAGNOSIS — R609 Edema, unspecified: Secondary | ICD-10-CM

## 2010-02-23 DIAGNOSIS — R290 Tetany: Secondary | ICD-10-CM | POA: Insufficient documentation

## 2010-02-23 DIAGNOSIS — I10 Essential (primary) hypertension: Secondary | ICD-10-CM | POA: Insufficient documentation

## 2010-02-23 DIAGNOSIS — R7301 Impaired fasting glucose: Secondary | ICD-10-CM

## 2010-02-24 ENCOUNTER — Encounter: Payer: Self-pay | Admitting: Family Medicine

## 2010-02-24 LAB — CONVERTED CEMR LAB
Chloride: 101 meq/L (ref 96–112)
Creatinine, Ser: 0.87 mg/dL (ref 0.40–1.20)
Glucose, Bld: 148 mg/dL — ABNORMAL HIGH (ref 70–99)
Potassium: 3.9 meq/L (ref 3.5–5.3)
Sodium: 139 meq/L (ref 135–145)

## 2010-03-03 NOTE — Assessment & Plan Note (Signed)
Summary: f/u BP   Vital Signs:  Patient profile:   50 year old female Height:      66.5 inches Weight:      226 pounds BMI:     36.06 O2 Sat:      98 % on Room air Pulse rate:   77 / minute BP sitting:   158 / 87  (left arm) Cuff size:   large  Vitals Entered By: Payton Spark CMA (February 23, 2010 3:11 PM)  O2 Flow:  Room air CC: F/U. discuss amlodipine    Primary Care Provider:  Seymour Bars DO  CC:  F/U. discuss amlodipine .  History of Present Illness: 50 yo AAF presents for f/u HTN since stopping amlodopine due to muscle spasms in legs which have resolved for the most part though she continues to have carpopedal spasms off the med  x 2 wks and w/o use of Lasix.    She reports having adverse SEs from Benicar and HCTZ in the past also but no true allergy.  Her leg swelling has improved by wearing compression hose and taking Lasix rarely.  Denies CP or DOE. She has just started to work on weight loss by improving diet, taking supplements but has yet to start exercising.  Current Medications (verified): 1)  Bystolic 20 Mg Tabs (Nebivolol Hcl) .... Take 1 Tab By Mouth Once Daily 2)  Amlodipine Besylate 5 Mg Tabs (Amlodipine Besylate) .... Take 1 Tab By Mouth Once Daily 3)  Trilipix 135 Mg Cpdr (Choline Fenofibrate) .... Take 1 Tab By Mouth Once Daily 4)  Furosemide 20 Mg Tabs (Furosemide) .... Take 1 Tab By Mouth Once Daily As Needed 5)  Anusol-Hc 25 Mg Supp (Hydrocortisone Acetate) .... Use As Directed Two Times A Day 6)  Estrace 0.1 Mg/gm Crea (Estradiol) .... Use As Directed 2 Times Weekly 7)  Aspirin 81 Mg Tbec (Aspirin) .... Take 1 Tab By Mouth Once Daily 8)  Vitron-C 125-200 Mg Tabs (Ferrous Fumarate-Vitamin C) .... Take 1 Tab By Mouth Once Daily 9)  Vitamin D 2000 Unit Tabs (Cholecalciferol) .... Take 1 Tab By Mouth Once Daily 10)  Calcium Carbonate Antacid 500 Mg Chew (Calcium Carbonate Antacid) 11)  Stool Softener 100 Mg Caps (Docusate Sodium) 12)  Amoxicillin  875 Mg Tabs (Amoxicillin) .Marland Kitchen.. 1 Tab By Mouth Two Times A Day X 10 Days  Allergies (verified): 1)  ! Sulfa 2)  ! Codeine 3)  ! Zithromax (Azithromycin) 4)  ! Amlodipine Besylate (Amlodipine Besylate)  Past History:  Past Medical History: Reviewed history from 12/01/2009 and no changes required. G3P2 gyn Dr Henderson Cloud IFG/ hx of GDM. high cholesterol HTN Anemia endometriosis chronic R hip pain -- Dr Charlann Boxer Dr Ethelene Hal LLE edema -- podiatry in [facility]  Past Surgical History: Reviewed history from 08/28/2009 and no changes required. colpo x 2 EGD 7-11 for anemia colonoscopy 05-2008 normal TAH in 1995, bilat oophorectomy - endometriosis  Family History: Reviewed history from 08/28/2009 and no changes required. mother high chol father bladder cancer 4 brothers DM, 1 sister DM  Social History: Reviewed history from 08/28/2009 and no changes required. 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 No regular exercise.  Review of Systems      See HPI  Physical Exam  General:  alert, well-developed, well-nourished, and well-hydrated.  obese Lungs:  Normal respiratory effort, chest expands symmetrically. Lungs are clear to auscultation, no crackles or wheezes. Heart:  Normal rate and regular rhythm. S1 and  S2 normal without gallop, murmur, click, rub or other extra sounds. Extremities:  trace LE edema Skin:  color normal.   Psych:  good eye contact, not anxious appearing, and not depressed appearing.     Impression & Recommendations:  Problem # 1:  ESSENTIAL HYPERTENSION, BENIGN (ICD-401.1) Assessment Deteriorated BP high off Amlodopine.  Due to adverse SEs, will replace with Micardis 40 mg/ day to start with.  Samples given.  RTC for nurse BP check in 3 wks.  Call if any problems.  Keep working on [employer], regular exercise, wt loss. The following medications were removed from the medication list:    Amlodipine Besylate 5 Mg Tabs (Amlodipine  besylate) .Marland Kitchen... Take 1 tab by mouth once daily    Furosemide 20 Mg Tabs (Furosemide) .Marland Kitchen... Take 1 tab by mouth once daily as needed Her updated medication list for this problem includes:    Bystolic 20 Mg Tabs (Nebivolol hcl) .Marland Kitchen... Take 1 tab by mouth once daily    Micardis 40 Mg Tabs (Telmisartan) .Marland Kitchen... 1 tab by mouth daily  BP today: 158/87 Prior BP: 134/80 (12/30/2009)  Labs Reviewed: K+: 4.3 (09/03/2009) Creat: : 0.78 (09/03/2009)   Chol: 184 (09/03/2009)   HDL: 44 (09/03/2009)   LDL: 88 (09/03/2009)   TG: 258 (09/03/2009)  Problem # 2:  CARPOPEDAL SPASM (ICD-781.7) Not on any regular diuretics.  Will check K+, Calcium and Mag level today. F/U results tomorrow.   Orders: T-Basic Metabolic Panel [phone]) T-Magnesium [id number]) T-Calcium  [phone])  Problem # 3:  PEDAL EDEMA (ICD-782.3) Improved.  rarely using Lasix.  She is wearing compression hose which have really helped. The following medications were removed from the medication list:    Furosemide 20 Mg Tabs (Furosemide) .Marland Kitchen... Take 1 tab by mouth once daily as needed  Problem # 4:  IMPAIRED FASTING GLUCOSE (ICD-790.21) REcheck at next visit.  Complete Medication List: 1)  Bystolic 20 Mg Tabs (Nebivolol hcl) .... Take 1 tab by mouth once daily 2)  Trilipix 135 Mg Cpdr (Choline fenofibrate) .... Take 1 tab by mouth once daily 3)  Anusol-hc 25 Mg Supp (Hydrocortisone acetate) .... Use as directed two times a day 4)  Estrace 0.1 Mg/gm Crea (Estradiol) .... Use as directed 2 times weekly 5)  Aspirin 81 Mg Tbec (Aspirin) .... Take 1 tab by mouth once daily 6)  Vitron-c 125-200 Mg Tabs (Ferrous fumarate-vitamin c) .... Take 1 tab by mouth once daily 7)  Vitamin D 2000 Unit Tabs (Cholecalciferol) .... Take 1 tab by mouth once daily 8)  Calcium Carbonate Antacid 500 Mg Chew (Calcium carbonate antacid) 9)  Stool Softener 100 Mg Caps (Docusate sodium) 10)  Micardis 40 Mg Tabs (Telmisartan) .Marland Kitchen.. 1 tab by mouth  daily  Patient Instructions: 1)  Stay on Bystolic daily. 2)  Add 1/2 tab of Micardis once daily. 3)  Check labs today. 4)  Return for nurse visit - BP check in 3 wks.   Orders Added: 1)  T-Basic Metabolic Panel [[id number]] 2)  T-Magnesium [[id number]] 3)  T-Calcium  [[id number]] 4)  Est. Patient Level IV [44034]

## 2010-03-05 ENCOUNTER — Ambulatory Visit: Payer: Self-pay | Admitting: Family Medicine

## 2010-03-16 ENCOUNTER — Ambulatory Visit (INDEPENDENT_AMBULATORY_CARE_PROVIDER_SITE_OTHER): Payer: Commercial Managed Care - PPO

## 2010-03-16 ENCOUNTER — Encounter: Payer: Self-pay | Admitting: Family Medicine

## 2010-03-16 DIAGNOSIS — I10 Essential (primary) hypertension: Secondary | ICD-10-CM

## 2010-03-19 ENCOUNTER — Encounter: Payer: Self-pay | Admitting: Family Medicine

## 2010-03-23 NOTE — Assessment & Plan Note (Signed)
Summary: injection - jr  Nurse Visit   Vital Signs:  Patient profile:   50 year old female Pulse rate:   83 / minute BP sitting:   163 / 90  (left arm) Cuff size:   large  Vitals Entered By: Payton Spark CMA (March 16, 2010 3:04 PM)  CC:  F/U BP.   Impression & Recommendations:  Problem # 1:  ESSENTIAL HYPERTENSION, BENIGN (ICD-401.1) BP stilll high .Will change to Micardist HCT. F/U in one month with Dr. Cathey Endow.  Her updated medication list for this problem includes:    Bystolic 20 Mg Tabs (Nebivolol hcl) .Marland Kitchen... Take 1 tab by mouth once daily    Micardis Hct 80-12.5 Mg Tabs (Telmisartan-hctz) .Marland Kitchen... Take 1 tablet by mouth once a day  Complete Medication List: 1)  Bystolic 20 Mg Tabs (Nebivolol hcl) .... Take 1 tab by mouth once daily 2)  Trilipix 135 Mg Cpdr (Choline fenofibrate) .... Take 1 tab by mouth once daily 3)  Anusol-hc 25 Mg Supp (Hydrocortisone acetate) .... Use as directed two times a day 4)  Estrace 0.1 Mg/gm Crea (Estradiol) .... Use as directed 2 times weekly 5)  Aspirin 81 Mg Tbec (Aspirin) .... Take 1 tab by mouth once daily 6)  Vitron-c 125-200 Mg Tabs (Ferrous fumarate-vitamin c) .... Take 1 tab by mouth once daily 7)  Vitamin D 2000 Unit Tabs (Cholecalciferol) .... Take 1 tab by mouth once daily 8)  Calcium Carbonate Antacid 500 Mg Chew (Calcium carbonate antacid) 9)  Stool Softener 100 Mg Caps (Docusate sodium) 10)  Micardis Hct 80-12.5 Mg Tabs (Telmisartan-hctz) .... Take 1 tablet by mouth once a day  CC: F/U BP   Allergies: 1)  ! Sulfa 2)  ! Codeine 3)  ! Zithromax (Azithromycin) 4)  ! Amlodipine Besylate (Amlodipine Besylate)  Orders Added: 1)  Est. Patient Level I [56213] Prescriptions: MICARDIS HCT 80-12.5 MG TABS (TELMISARTAN-HCTZ) Take 1 tablet by mouth once a day  #30 x 0   Entered by:   [name] MD   Authorized by:   Seymour Bars DO   Signed by:   [name] MD on 03/17/2010   Method used:   Electronically to   [facility]  [address] [phone] (retail)       [address]       [place], [place]  [address]       Ph: [phone] or [phone]       Fax: [phone]   RxID:   [id number]    CC: Follow-up HTN HPI: Taking meds? yes Side effects? no  Chest pain, SOB, Dizziness? Increased HAs A/P: HTN (401.1) At goal? If no, Patient will be notified.  5 minutes was spent with patient.   Appended Document: F/U BP LMOM for Pt to CB.Arvilla Market CMA, Michelle March 17, 2010 1:48 PM   Pt states she is unable to take Micardis HCTZ bc it gives her muscle spasms. Please advise. Arvilla Market CMA, Michelle March 17, 2010 4:14 PM   Stop the Micardis HCTZ and change to Carvedilol 1 tab twice daily for BP. Return for a nurse visit recheck BP in 3 wks.  Muscle spasms should resolve by then.  Seymour Bars, D.O.  Appended Document: injection - jr Pt aware of the above

## 2010-03-23 NOTE — Miscellaneous (Signed)
Summary: stop Carvedilol, already on bystolic  Clinical Lists Changes  Medications: Removed medication of CARVEDILOL 6.25 MG TABS (CARVEDILOL) 1 tab by mouth bid

## 2010-04-05 ENCOUNTER — Telehealth: Payer: Self-pay | Admitting: *Deleted

## 2010-04-05 NOTE — Telephone Encounter (Signed)
Pt states she is only taking Bystolic for BP and it is not bringing BP down to normal range. Her BP today was 148/96. Please advise.

## 2010-04-06 NOTE — Telephone Encounter (Signed)
We can add HCTZ 25 mg once daily OR she can schedule a nurse visit this wk.

## 2010-04-07 ENCOUNTER — Encounter: Payer: Self-pay | Admitting: Family Medicine

## 2010-04-07 NOTE — Telephone Encounter (Signed)
Pt can not take HCTZ so she will schedule OV w/ Dr. B for next week

## 2010-04-12 ENCOUNTER — Encounter: Payer: Self-pay | Admitting: Family Medicine

## 2010-04-12 ENCOUNTER — Ambulatory Visit (INDEPENDENT_AMBULATORY_CARE_PROVIDER_SITE_OTHER): Payer: Commercial Managed Care - PPO | Admitting: Family Medicine

## 2010-04-12 VITALS — BP 158/89 | HR 82 | Ht 66.0 in | Wt 227.0 lb

## 2010-04-12 DIAGNOSIS — I10 Essential (primary) hypertension: Secondary | ICD-10-CM

## 2010-04-12 LAB — POCT URINALYSIS DIPSTICK
Glucose, UA: NEGATIVE
Leukocytes, UA: NEGATIVE
Urobilinogen, UA: 0.2

## 2010-04-12 MED ORDER — HYDRALAZINE HCL 25 MG PO TABS: 25.0000 mg | ORAL_TABLET | Freq: Four times a day (QID) | ORAL | Status: DC

## 2010-04-12 NOTE — Progress Notes (Signed)
Addended by: Payton Spark on: 04/12/2010 03:49 PM   Modules accepted: Orders

## 2010-04-12 NOTE — Assessment & Plan Note (Signed)
UA normal today.  BP is high despite use of Bystolic since Sept 2011.  Labs UTD.  Likely related to obesity with BMI of 36.6.  Since she is not very motivated to make changes today, will add Hydralazine 25 mg qid, starting with 1/2 tab 3 x a day for the first wk.  Call if any problems.  Long term, needs to work on wt loss.  Consider a renal angiogram and a sleep study if not improving.  Has failed every other class of anti hypertensive drug.

## 2010-04-12 NOTE — Progress Notes (Signed)
  Subjective:    Patient ID: Donna Dyer, female    DOB: 1960/03/12, 50 y.o.   MRN: [id number]    HPI  50 yo AAF presents for f/u HTN.  She is taking Bystolic 20 mg once daily.  Her home BPs are 150's /90's.  She has been tried on HCTZ, Micardis, Amlodopine but had SEs from all of these.  She has been having some HAs but denies chest pain, SOB, palpitations or edema.  She saw Dr Kendra Opitz in [facility] and was diagnosed with lumbar radiculopathy.  Neurontin was started and she is doing PT.  She did have to stop the neurontin due to feeling dizzy.  Denies stress, anxiety or poor sleep.  She is taking ibuprofen 400 mg po bid for pain.    BP 158/89  Pulse 82  Ht 5\' 6"  (1.676 m)  Wt 227 lb (102.967 kg)  BMI 36.64 kg/m2  SpO2 95%  Review of Systems  Constitutional: Negative for fatigue and unexpected weight change.  Eyes: Negative for visual disturbance.  Respiratory: Negative for chest tightness and shortness of breath.   Cardiovascular: Negative for chest pain, palpitations and leg swelling.  Genitourinary: Negative for difficulty urinating.  Neurological: Positive for headaches. Negative for dizziness.  Psychiatric/Behavioral: Negative for dysphoric mood.       Objective:   Physical Exam  Constitutional: She appears well-developed and well-nourished. No distress.  Eyes: Pupils are equal, round, and reactive to light.  Neck: Normal range of motion. Neck supple. No JVD present. No thyromegaly present.  Cardiovascular: Normal rate, regular rhythm and normal heart sounds.   No murmur heard. Pulmonary/Chest: Effort normal and breath sounds normal.  Musculoskeletal: She exhibits edema (1+ pitting bilat LE edema).  Skin: Skin is warm and dry.  Psychiatric: She has a normal mood and affect.          Assessment & Plan:

## 2010-04-12 NOTE — Patient Instructions (Signed)
Stay on Bystolic. Add HYDRALAZINE-- START WITH 1/2 TAB 3 X A DAY FOR THE FIRST WEEK THEN INCREASE TO A FULL TAB 4 X A DAY (breakfast, lunch, dinner, bedtime).  Call if any problems.  Return for a nurse visit BP check in 3 wks.

## 2010-05-03 ENCOUNTER — Ambulatory Visit (INDEPENDENT_AMBULATORY_CARE_PROVIDER_SITE_OTHER): Payer: Commercial Managed Care - PPO | Admitting: Family Medicine

## 2010-05-03 ENCOUNTER — Encounter: Payer: Self-pay | Admitting: Family Medicine

## 2010-05-03 VITALS — BP 128/73 | HR 77

## 2010-05-03 DIAGNOSIS — I10 Essential (primary) hypertension: Secondary | ICD-10-CM

## 2010-05-03 MED ORDER — HYDRALAZINE HCL 25 MG PO TABS: 25.0000 mg | ORAL_TABLET | Freq: Four times a day (QID) | ORAL | Status: DC

## 2010-05-03 NOTE — Progress Notes (Signed)
  Subjective:    Patient ID: Donna Dyer, female    DOB: 09-10-1960, 50 y.o.   MRN: [id number]  HPI Pt denies chest pain, SOB, dizziness, or heart palpitations.  Taking meds as directed w/o problems.  Denies medication side effects.  5 min spent with pt.     Review of Systems     Objective:   Physical Exam        Assessment & Plan:

## 2010-07-04 ENCOUNTER — Other Ambulatory Visit: Payer: Self-pay | Admitting: Family Medicine

## 2010-07-04 LAB — LIPID PANEL: HDL: 60 mg/dL (ref 35–70)

## 2010-08-20 ENCOUNTER — Encounter: Payer: Self-pay | Admitting: Family Medicine

## 2010-08-20 ENCOUNTER — Ambulatory Visit (INDEPENDENT_AMBULATORY_CARE_PROVIDER_SITE_OTHER): Payer: Commercial Managed Care - PPO | Admitting: Family Medicine

## 2010-08-20 DIAGNOSIS — L299 Pruritus, unspecified: Secondary | ICD-10-CM

## 2010-08-20 DIAGNOSIS — Z131 Encounter for screening for diabetes mellitus: Secondary | ICD-10-CM

## 2010-08-20 DIAGNOSIS — I10 Essential (primary) hypertension: Secondary | ICD-10-CM

## 2010-08-20 MED ORDER — TRIAMCINOLONE ACETONIDE 0.5 % EX CREA: TOPICAL_CREAM | Freq: Three times a day (TID) | CUTANEOUS | Status: DC

## 2010-08-20 MED ORDER — NEBIVOLOL HCL 20 MG PO TABS: 1.0000 | ORAL_TABLET | Freq: Every day | ORAL | Status: DC

## 2010-08-20 MED ORDER — HYDRALAZINE HCL 25 MG PO TABS: 25.0000 mg | ORAL_TABLET | Freq: Four times a day (QID) | ORAL | Status: DC

## 2010-08-20 NOTE — Patient Instructions (Signed)
Stay on Bystolic + Hydralazine for BP.  Use Triamcinolone cream 2-3 x a day on itchy skin for 10 days.  Labs today.  Will call you w/ results Monday.  If itching is not improving, will get Cards to see you for alternatives for BP.

## 2010-08-21 LAB — COMPLETE METABOLIC PANEL WITH GFR
AST: 24 U/L (ref 0–37)
Albumin: 4.1 g/dL (ref 3.5–5.2)
Alkaline Phosphatase: 113 U/L (ref 39–117)
BUN: 15 mg/dL (ref 6–23)
Calcium: 9.8 mg/dL (ref 8.4–10.5)
GFR, Est African American: >60 mL/min (ref >60)
GFR, Est Non African American: 59 mL/min — ABNORMAL LOW (ref >60)
Potassium: 4 mEq/L (ref 3.5–5.3)
Total Bilirubin: 0.5 mg/dL (ref 0.3–1.2)
Total Protein: 7.5 g/dL (ref 6.0–8.3)

## 2010-08-21 LAB — HEMOGLOBIN A1C
Hgb A1c MFr Bld: 6.9 % — ABNORMAL HIGH (ref <5.7)
Mean Plasma Glucose: 151 mg/dL — ABNORMAL HIGH (ref <117)

## 2010-08-23 ENCOUNTER — Telehealth: Payer: Self-pay | Admitting: Family Medicine

## 2010-08-23 ENCOUNTER — Encounter: Payer: Self-pay | Admitting: Family Medicine

## 2010-08-23 DIAGNOSIS — E119 Type 2 diabetes mellitus without complications: Secondary | ICD-10-CM | POA: Insufficient documentation

## 2010-08-23 MED ORDER — HYDRALAZINE HCL 25 MG PO TABS: 25.0000 mg | ORAL_TABLET | Freq: Four times a day (QID) | ORAL | Status: DC

## 2010-08-23 MED ORDER — NEBIVOLOL HCL 20 MG PO TABS: 1.0000 | ORAL_TABLET | Freq: Every day | ORAL | Status: DC

## 2010-08-23 MED ORDER — METFORMIN HCL 500 MG PO TABS: ORAL_TABLET | ORAL | Status: DC

## 2010-08-23 NOTE — Telephone Encounter (Signed)
Pls let pt know that her A1C came back 6.9 which is in the DIABETIC RANGE (>6.5).  Her chemistry panel came back normal.  I am going to start her on Metformin 500 mg with dinner and bring her back in 3 wks to get her started with checking AM fasting sugars with a meter and set her up with a nutritionist.  She definitely has the potential to turn this back to pre-diabetes but it will require diet, exercise and wt loss.  Needs the metformin now to get these sugars down.

## 2010-08-23 NOTE — Assessment & Plan Note (Addendum)
BP high today b/c pt stopped hydralazine on her own.  I truly doubt her pruritis is related to hydralazine and she definitely does not have a drug rash.  I will restart her hydralazine today and continue her Bystolic. Update labs.  F/u with cards if continuing to have problems since she has tried and 'failed' almost every single anti hypertensive.

## 2010-08-23 NOTE — Telephone Encounter (Signed)
Addended by: Ellsworth Lennox on: 08/23/2010 01:26 PM   Modules accepted: Orders

## 2010-08-23 NOTE — Telephone Encounter (Signed)
Pt aware of the above  

## 2010-08-23 NOTE — Progress Notes (Signed)
  Subjective:    Patient ID: Donna Dyer, female    DOB: 28-May-1960, 50 y.o.   MRN: [id number]  HPI  50 yo AAF presents for f/u HTN.  She had been on multiple different anti hypertensives thru the years and had adverse SEs to most.  I started her on Hydralazine in the Spring to get her BP under better control.  She had done well on it, taking 4 x a day but then developed itching skin on her arms mostly about a month ago and took herself off the hydralazine.  She is still itchy though.  Denies seeing a rash.  Has not used anything for her itchy skin.  Denies CP or DOE or palpitations or leg swelling.  She is doing well on Bystolic, which she is still taking.    BP 154/93  Pulse 82  Ht 5\' 6"  (1.676 m)  Wt 222 lb (100.699 kg)  BMI 35.83 kg/m2  SpO2 95%   Review of Systems as per HPI     Objective:   Physical Exam  Constitutional: She appears well-developed and well-nourished. No distress.       obese  HENT:  Mouth/Throat: Oropharynx is clear and moist.  Eyes: Pupils are equal, round, and reactive to light. No scleral icterus.  Neck: Neck supple. No thyromegaly present.  Cardiovascular: Normal rate, regular rhythm and normal heart sounds.   No murmur heard. Pulmonary/Chest: Effort normal and breath sounds normal.  Abdominal:       No HSM, soft, NT/ ND  Musculoskeletal: She exhibits no edema.  Skin: Skin is warm and dry. No rash noted.  Psychiatric: She has a normal mood and affect.          Assessment & Plan:  Pruritis- doubt this was from Hydralazine, so restarted.  RX for steroid cream given to use for 10 days.  Check LFTs with labs and f/u results.  If not improving, Pls call.

## 2010-09-27 ENCOUNTER — Telehealth: Payer: Self-pay | Admitting: Family Medicine

## 2010-09-27 ENCOUNTER — Ambulatory Visit (INDEPENDENT_AMBULATORY_CARE_PROVIDER_SITE_OTHER): Payer: Commercial Managed Care - PPO | Admitting: Family Medicine

## 2010-09-27 ENCOUNTER — Encounter: Payer: Self-pay | Admitting: Family Medicine

## 2010-09-27 VITALS — BP 154/93 | HR 74 | Wt 220.0 lb

## 2010-09-27 DIAGNOSIS — I10 Essential (primary) hypertension: Secondary | ICD-10-CM

## 2010-09-27 DIAGNOSIS — E119 Type 2 diabetes mellitus without complications: Secondary | ICD-10-CM

## 2010-09-27 DIAGNOSIS — Z23 Encounter for immunization: Secondary | ICD-10-CM

## 2010-09-27 DIAGNOSIS — E041 Nontoxic single thyroid nodule: Secondary | ICD-10-CM

## 2010-09-27 LAB — POCT UA - MICROALBUMIN

## 2010-09-27 MED ORDER — PNEUMOCOCCAL VAC POLYVALENT 25 MCG/0.5ML IJ INJ: 0.5000 mL | INJECTION | Freq: Once | INTRAMUSCULAR | Status: DC

## 2010-09-27 NOTE — Telephone Encounter (Signed)
Called [facility] and notified the ultrasound dept to run the ultrasound as thyroid u/s. Jarvis Newcomer, LPN Domingo Dimes

## 2010-09-27 NOTE — Telephone Encounter (Signed)
Pt had ordered a ultrasound soft tissue head/neck, and [facility] ultrasound calling stating it needs to be ordered as thyroid ultrasound.  Please advise. Plan:  Routed message to Dr. Linford Arnold for verification. Jarvis Newcomer, LPN Domingo Dimes

## 2010-09-27 NOTE — Progress Notes (Signed)
  Subjective:    Patient ID: Donna Dyer, female    DOB: 12/12/1960, 50 y.o.   MRN: [id number]  HPI DM- Has't been getting any regular exercises.  Did start pilates. Was doing zumba but them hurt her hip. She has been watching her diet. Has been borderline for years. She was supposed to start taking the metformin but never picked up the prescription.  HTN- Not well conrolled. She take her meds regularly. Has had more HA since being on hydralazine. Also taking bystolic.     Review of Systems     Objective:   Physical Exam  Constitutional: She is oriented to person, place, and time. She appears well-developed and well-nourished.  HENT:  Head: Normocephalic and atraumatic.  Cardiovascular: Normal rate, regular rhythm and normal heart sounds.   Pulmonary/Chest: Effort normal and breath sounds normal.  Neurological: She is alert and oriented to person, place, and time.  Skin: Skin is warm and dry.  Psychiatric: She has a normal mood and affect. Her behavior is normal.          Assessment & Plan:  DM- Metformin can cuase some loose stools. Asked her to start the medication and then f/u in 3 months. uirne micro performed today. Eye exam is up to dte. Due again in Dec.   HTN - Not well controlled. She has tried several different medications in the past. She does tolerate the diastolic well and is doing okay on hydralazine except she is getting more frequent headaches. At this point in time I discussed referring her to cardiology for further evaluation and to help manage her blood pressure regimen. She said she is open to this and in fact, Dr. Cathey Endow was planning on doing that if her blood pressure didn't respond to the hydralazine. She does remember being on verapamil in the past and she remembers tolerating it well but says it just never lower her blood pressure.  Pneumonia vaccine given today.

## 2010-09-27 NOTE — Telephone Encounter (Signed)
Yes, OK for thyroid US  ( our system calls it head and neck  With thyroid in parenthesis).  Can give them a verbal.

## 2010-09-28 ENCOUNTER — Other Ambulatory Visit: Payer: Self-pay | Admitting: Family Medicine

## 2010-09-29 ENCOUNTER — Telehealth: Payer: Self-pay | Admitting: *Deleted

## 2010-09-29 ENCOUNTER — Telehealth: Payer: Self-pay | Admitting: Family Medicine

## 2010-09-29 NOTE — Telephone Encounter (Signed)
Message copied by Lanae Crumbly on Wed Sep 29, 2010  1:34 PM ------      Message from: [name] D      Created: Tue Sep 28, 2010  6:43 PM       Thyroid looks great.

## 2010-09-29 NOTE — Telephone Encounter (Signed)
Called patient: Thyroid ultrasound is consistent with multinodular goiter. The appearance is stable from her prior exam and no changes which is very reassuring. Multinodular goiter is benign.

## 2010-09-29 NOTE — Telephone Encounter (Signed)
Pt notified of results. KJ LPN 

## 2010-09-29 NOTE — Telephone Encounter (Signed)
Pt notifeid of results. KJ LPN 

## 2010-09-29 NOTE — Telephone Encounter (Signed)
Message copied by Lanae Crumbly on Wed Sep 29, 2010  1:40 PM ------      Message from: [name] D      Created: Tue Sep 28, 2010  1:16 PM       Thyroid looks great.

## 2010-09-30 NOTE — Telephone Encounter (Signed)
Left message on vm

## 2010-10-05 ENCOUNTER — Telehealth: Payer: Self-pay | Admitting: Family Medicine

## 2010-10-05 NOTE — Telephone Encounter (Signed)
Pt called and said she received pneumo vaccine in the mail from [facility], but states she had already received the vaccine in the office.  Pt received vaccine in office on 09-27-10.  Verified. Plan:  Pt instructed to call the mail order and tell them they sent that to her in error.  Told pt to tell the mail order she rec in provider office on 09-27-10.  Pt told to call back if additional problems and can't get resolved. Jarvis Newcomer, LPN Domingo Dimes

## 2010-10-05 NOTE — Telephone Encounter (Signed)
Pt called [facility] and they told her they would send her return mail slip and she could send the vaccine back. Jarvis Newcomer, LPN Domingo Dimes

## 2010-10-06 DIAGNOSIS — R09A2 Foreign body sensation, throat: Secondary | ICD-10-CM | POA: Insufficient documentation

## 2010-10-06 DIAGNOSIS — R0989 Other specified symptoms and signs involving the circulatory and respiratory systems: Secondary | ICD-10-CM | POA: Insufficient documentation

## 2010-10-06 DIAGNOSIS — R198 Other specified symptoms and signs involving the digestive system and abdomen: Secondary | ICD-10-CM | POA: Insufficient documentation

## 2010-10-11 ENCOUNTER — Encounter: Payer: Self-pay | Admitting: Cardiology

## 2010-10-13 ENCOUNTER — Encounter: Payer: Self-pay | Admitting: Cardiology

## 2010-10-13 ENCOUNTER — Ambulatory Visit (INDEPENDENT_AMBULATORY_CARE_PROVIDER_SITE_OTHER): Payer: Commercial Managed Care - PPO | Admitting: Cardiology

## 2010-10-13 VITALS — BP 162/93 | HR 74 | Resp 18 | Ht 66.0 in | Wt 217.4 lb

## 2010-10-13 DIAGNOSIS — I10 Essential (primary) hypertension: Secondary | ICD-10-CM

## 2010-10-13 MED ORDER — LISINOPRIL-HYDROCHLOROTHIAZIDE 10-12.5 MG PO TABS: 1.0000 | ORAL_TABLET | Freq: Every day | ORAL | Status: DC

## 2010-10-13 NOTE — Assessment & Plan Note (Signed)
Management per primary care. 

## 2010-10-13 NOTE — Assessment & Plan Note (Addendum)
Blood pressure elevated. Patient also recently diagnosed with diabetes mellitus. Discontinue hydralazine. Began lisinopril HCT 10/12.5 mg daily. Check potassium and renal function in one week. Advance as needed for blood pressure control. Continue bystolic. Check echocardiogram. Patient instructed on lifestyle modification.

## 2010-10-13 NOTE — Assessment & Plan Note (Signed)
Some edema in the past now improved. Check echocardiogram.

## 2010-10-13 NOTE — Patient Instructions (Signed)
Your physician recommends that you schedule a follow-up appointment in: 6-8 WEEKS  STOP HYDRALAZINE  START LISINOPRIL HCTZ 10-12.5 MG ONCE DAILY  Your physician has requested that you have an echocardiogram. Echocardiography is a painless test that uses sound waves to create images of your heart. It provides your doctor with information about the size and shape of your heart and how well your heart's chambers and valves are working. This procedure takes approximately one hour. There are no restrictions for this procedure.   Your physician recommends that you return for lab work in: ONE WEEK

## 2010-10-13 NOTE — Progress Notes (Signed)
HPI:50 yo female with no prior cardiac history for evaluation of hypertension. Patient with recent difficulty in controlling blood pressure. However she denies dyspnea, chest pain, palpitations, syncope. Because of her hypertension we were asked to further evaluate.  Current Outpatient Prescriptions  Medication Sig Dispense Refill  . aspirin 81 MG tablet Take 81 mg by mouth. When remembers      . calcium carbonate (TUMS - DOSED IN MG ELEMENTAL CALCIUM) 500 MG chewable tablet Chew by mouth.        . Cholecalciferol (VITAMIN D) 2000 UNITS CAPS Take by mouth daily.        Marland Kitchen estradiol (ESTRACE) 0.1 MG/GM vaginal cream Place 2 g vaginally as directed. 2 times weekly       . hydrALAZINE (APRESOLINE) 25 MG tablet Take 1 tablet (25 mg total) by mouth 4 (four) times daily.  120 tablet  1  . metFORMIN (GLUCOPHAGE) 500 MG tablet 1 tab po qPM with dinner  30 tablet  1  . Multiple Vitamin (MULTIVITAMIN) tablet Take 1 tablet by mouth daily.        . Nebivolol HCl (BYSTOLIC) 20 MG TABS Take 1 tablet (20 mg total) by mouth daily.  90 tablet  1   Current Facility-Administered Medications  Medication Dose Route Frequency Provider Last Rate Last Dose  . pneumococcal 23 valent vaccine (PNU-IMMUNE) injection 0.5 mL  0.5 mL Intramuscular Once [name], MD         Past Medical History  Diagnosis Date  . Hyperlipidemia   . Hypertension   . Anemia   . Endometriosis   . Pain, joint, hip, right     chronic- Dr Charlann Boxer Dr Ethelene Hal  . Edema     LLE- podiatry in [place]  . Diabetes mellitus   . Goiter   . Hemorrhoids     Past Surgical History  Procedure Date  . Esophagogastroduodenoscopy 7-11    for anemia  . Total abdominal hysterectomy 1995    bilat oophorectomy/ endometriosis    History   Social History  . Marital Status: Married    Spouse Name: N/A    Number of Children: 2  . Years of Education: N/A   Occupational History  . 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 Smoking status: Never Smoker   . Smokeless tobacco: Not on file  . Alcohol Use: No  . Drug Use: Not on file  . Sexually Active: Not on file   Other Topics Concern  . Not on file   Social History Narrative  . No narrative on file    ROS: no fevers or chills, productive cough, hemoptysis, dysphasia, odynophagia, melena, hematochezia, dysuria, hematuria, rash, seizure activity, orthopnea, PND, pedal edema, claudication. Remaining systems are negative.  Physical Exam: Well-developed obese in no acute distress.  Skin is warm and dry.  HEENT is normal. Normal eyelids Neck is supple. No thyromegaly. No bruits Chest is clear to auscultation with normal expansion.  Cardiovascular exam is regular rate and rhythm. No murmurs rubs or gallops Abdominal exam nontender or distended. No masses palpated. Extremities show no edema. neuro grossly intact  ECG normal sinus rhythm with no ST changes.

## 2010-10-22 ENCOUNTER — Ambulatory Visit (HOSPITAL_COMMUNITY): Payer: Commercial Managed Care - PPO | Attending: Cardiology | Admitting: Radiology

## 2010-10-22 ENCOUNTER — Ambulatory Visit (INDEPENDENT_AMBULATORY_CARE_PROVIDER_SITE_OTHER): Payer: Commercial Managed Care - PPO | Admitting: *Deleted

## 2010-10-22 DIAGNOSIS — R609 Edema, unspecified: Secondary | ICD-10-CM | POA: Insufficient documentation

## 2010-10-22 DIAGNOSIS — E669 Obesity, unspecified: Secondary | ICD-10-CM | POA: Insufficient documentation

## 2010-10-22 DIAGNOSIS — E119 Type 2 diabetes mellitus without complications: Secondary | ICD-10-CM | POA: Insufficient documentation

## 2010-10-22 DIAGNOSIS — I10 Essential (primary) hypertension: Secondary | ICD-10-CM | POA: Insufficient documentation

## 2010-10-22 DIAGNOSIS — I517 Cardiomegaly: Secondary | ICD-10-CM

## 2010-10-22 DIAGNOSIS — E785 Hyperlipidemia, unspecified: Secondary | ICD-10-CM | POA: Insufficient documentation

## 2010-10-22 LAB — BASIC METABOLIC PANEL
BUN: 15 mg/dL (ref 6–23)
CO2: 32 mEq/L (ref 19–32)
Calcium: 9.1 mg/dL (ref 8.4–10.5)
Chloride: 103 mEq/L (ref 96–112)
Creatinine, Ser: 1 mg/dL (ref 0.4–1.2)
Glucose, Bld: 91 mg/dL (ref 70–99)

## 2010-10-28 ENCOUNTER — Encounter: Payer: Self-pay | Admitting: Family Medicine

## 2010-10-29 ENCOUNTER — Encounter: Payer: Self-pay | Admitting: Cardiology

## 2010-11-10 ENCOUNTER — Ambulatory Visit: Payer: Commercial Managed Care - PPO | Admitting: Cardiology

## 2010-11-10 ENCOUNTER — Ambulatory Visit (INDEPENDENT_AMBULATORY_CARE_PROVIDER_SITE_OTHER): Payer: Commercial Managed Care - PPO | Admitting: Cardiology

## 2010-11-10 ENCOUNTER — Encounter: Payer: Self-pay | Admitting: Cardiology

## 2010-11-10 VITALS — BP 170/100 | HR 72 | Resp 18 | Ht 66.0 in | Wt 210.0 lb

## 2010-11-10 DIAGNOSIS — I10 Essential (primary) hypertension: Secondary | ICD-10-CM

## 2010-11-10 MED ORDER — LISINOPRIL-HYDROCHLOROTHIAZIDE 20-25 MG PO TABS: 1.0000 | ORAL_TABLET | Freq: Every day | ORAL | Status: DC

## 2010-11-10 NOTE — Assessment & Plan Note (Signed)
Management per primary care. 

## 2010-11-10 NOTE — Progress Notes (Signed)
HPI: pleasant female for followup of hypertension. Echocardiogram in October of 2012 showed normal LV function, grade 1 diastolic dysfunction, and mild left atrial enlargement. When I last saw her we begin lisinopril HCT. Since then, the patient denies any dyspnea on exertion, orthopnea, PND, pedal edema, palpitations, syncope or chest pain.   Current Outpatient Prescriptions  Medication Sig Dispense Refill  . aspirin 81 MG tablet Take 81 mg by mouth. When remembers      . calcium carbonate (TUMS - DOSED IN MG ELEMENTAL CALCIUM) 500 MG chewable tablet Chew by mouth.        . Cholecalciferol (VITAMIN D) 2000 UNITS CAPS Take by mouth daily.        Marland Kitchen estradiol (ESTRACE) 0.1 MG/GM vaginal cream Place 2 g vaginally as directed. 2 times weekly       . lisinopril-hydrochlorothiazide (PRINZIDE,ZESTORETIC) 10-12.5 MG per tablet Take 1 tablet by mouth daily.  30 tablet  12  . metFORMIN (GLUCOPHAGE) 500 MG tablet 1 tab po qPM with dinner  30 tablet  1  . Multiple Vitamin (MULTIVITAMIN) tablet Take 1 tablet by mouth daily.        . Nebivolol HCl (BYSTOLIC) 20 MG TABS Take 1 tablet (20 mg total) by mouth daily.  90 tablet  1   Current Facility-Administered Medications  Medication Dose Route Frequency Provider Last Rate Last Dose  . pneumococcal 23 valent vaccine (PNU-IMMUNE) injection 0.5 mL  0.5 mL Intramuscular Once [name], MD         Past Medical History  Diagnosis Date  . Hyperlipidemia   . Hypertension   . Anemia   . Endometriosis   . Pain, joint, hip, right     chronic- Dr Charlann Boxer Dr Ethelene Hal  . Edema     LLE- podiatry in [place]  . Diabetes mellitus   . Goiter   . Hemorrhoids     Past Surgical History  Procedure Date  . Esophagogastroduodenoscopy 7-11    for anemia  . Total abdominal hysterectomy 1995    bilat oophorectomy/ endometriosis    History   Social History  . Marital Status: Married    Spouse Name: N/A    Number of Children: 2  . 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 Smoking status: Never Smoker   . Smokeless tobacco: Not on file  . Alcohol Use: No  . Drug Use: Not on file  . Sexually Active: Not on file   Other Topics Concern  . Not on file   Social History Narrative  . No narrative on file    ROS: no fevers or chills, productive cough, hemoptysis, dysphasia, odynophagia, melena, hematochezia, dysuria, hematuria, rash, seizure activity, orthopnea, PND, pedal edema, claudication. Remaining systems are negative.  Physical Exam: Well-developed well-nourished in no acute distress.  Skin is warm and dry.  HEENT is normal.  Neck is supple. No thyromegaly.  Chest is clear to auscultation with normal expansion.  Cardiovascular exam is regular rate and rhythm.  Abdominal exam nontender or distended. No masses palpated. Extremities show no edema. neuro grossly intact

## 2010-11-10 NOTE — Patient Instructions (Signed)
Your physician wants you to follow-up in: 6 MONTHS You will receive a reminder letter in the mail two months in advance. If you don't receive a letter, please call our office to schedule the follow-up appointment.   Your physician recommends that you return for lab work in: ONE WEEK  INCREASE LISINOPRIL-HCTZ TO 20/25 MG ONCE DAILY

## 2010-11-10 NOTE — Assessment & Plan Note (Signed)
Patient has followed her blood pressure at home. Her systolic is typically 140-150. I will increase her lisinopril HCT to 20/25 mg daily. Check potassium and renal function in one week. We will increase lisinopril further and add additional medications as needed. She will continue to follow her blood pressure at home and keep records for [place].

## 2010-11-18 LAB — BASIC METABOLIC PANEL WITH GFR
BUN: 15 mg/dL (ref 6–23)
Creat: 0.89 mg/dL (ref 0.50–1.10)
GFR, Est African American: 86 mL/min — ABNORMAL LOW (ref >90)
Glucose, Bld: 101 mg/dL — ABNORMAL HIGH (ref 70–99)
Potassium: 3.9 mEq/L (ref 3.5–5.3)

## 2010-11-30 ENCOUNTER — Telehealth: Payer: Self-pay | Admitting: Cardiology

## 2010-11-30 DIAGNOSIS — I10 Essential (primary) hypertension: Secondary | ICD-10-CM

## 2010-11-30 NOTE — Telephone Encounter (Signed)
Patient called complaining since lisinopril/hct increased to 20/25mg s.daily having muscle spasms and has noticed bruising.Bmet 11/10/10 was ok. Patient wants to know what to do.

## 2010-11-30 NOTE — Telephone Encounter (Signed)
New Msg: Pt calling stating that she is having muscle spasms as related to pt recent pt prescription of lisinopril. Pt also c/o bruises that she does not know the origin of. Please return pt call to discuss further.

## 2010-12-01 ENCOUNTER — Ambulatory Visit: Payer: Commercial Managed Care - PPO | Admitting: *Deleted

## 2010-12-01 DIAGNOSIS — I10 Essential (primary) hypertension: Secondary | ICD-10-CM

## 2010-12-01 LAB — BASIC METABOLIC PANEL
BUN: 20 mg/dL (ref 6–23)
Calcium: 9.2 mg/dL (ref 8.4–10.5)
Chloride: 100 mEq/L (ref 96–112)
Creat: 1.04 mg/dL (ref 0.50–1.10)

## 2010-12-01 NOTE — Telephone Encounter (Signed)
Fu call °Pt returning your call  °

## 2010-12-01 NOTE — Telephone Encounter (Signed)
Spoke with pt, she is aware of dr Ludwig Clarks recommendations. Order mailed to pt for lab work to be checked in [place] at [facility]

## 2010-12-01 NOTE — Telephone Encounter (Signed)
Check BMET (not checked since increasing meds); fu with her primary care for bruising (doubt related to BP med) Donna Dyer

## 2010-12-03 ENCOUNTER — Telehealth: Payer: Self-pay | Admitting: *Deleted

## 2010-12-03 DIAGNOSIS — E876 Hypokalemia: Secondary | ICD-10-CM

## 2010-12-03 MED ORDER — POTASSIUM CHLORIDE CRYS ER 20 MEQ PO TBCR: 20.0000 meq | EXTENDED_RELEASE_TABLET | Freq: Every day | ORAL | Status: DC

## 2010-12-03 NOTE — Telephone Encounter (Signed)
Spoke with pt, she will have labs drawn in the [place] office. Order mailed to pt Donna Dyer

## 2010-12-03 NOTE — Telephone Encounter (Signed)
Message copied by Freddi Starr on Fri Dec 03, 2010  3:09 PM ------      Message from: Lewayne Bunting      Created: Thu Dec 02, 2010  6:13 AM       kcl 20 meq daily; bmet one week      Olga Millers

## 2010-12-07 ENCOUNTER — Ambulatory Visit (INDEPENDENT_AMBULATORY_CARE_PROVIDER_SITE_OTHER): Payer: Commercial Managed Care - PPO | Admitting: Family Medicine

## 2010-12-07 ENCOUNTER — Other Ambulatory Visit: Payer: Self-pay | Admitting: Cardiology

## 2010-12-07 ENCOUNTER — Encounter: Payer: Self-pay | Admitting: Family Medicine

## 2010-12-07 VITALS — BP 138/70 | HR 68 | Wt 207.0 lb

## 2010-12-07 DIAGNOSIS — R229 Localized swelling, mass and lump, unspecified: Secondary | ICD-10-CM

## 2010-12-07 LAB — CBC
Hemoglobin: 12.3 g/dL (ref 12.0–15.0)
MCH: 27.2 pg (ref 26.0–34.0)
MCHC: 32.8 g/dL (ref 30.0–36.0)
RBC: 4.52 MIL/uL (ref 3.87–5.11)
RDW: 13.8 % (ref 11.5–15.5)
WBC: 8.6 10*3/uL (ref 4.0–10.5)

## 2010-12-07 LAB — HEPATIC FUNCTION PANEL
ALT: 13 U/L (ref 0–35)
AST: 18 U/L (ref 0–37)
Albumin: 4.4 g/dL (ref 3.5–5.2)
Alkaline Phosphatase: 112 U/L (ref 39–117)
Total Protein: 8.2 g/dL (ref 6.0–8.3)

## 2010-12-07 LAB — SEDIMENTATION RATE: Sed Rate: 45 mm/hr — ABNORMAL HIGH (ref 0–22)

## 2010-12-07 NOTE — Patient Instructions (Signed)
We will call you with your lab results. If you don't here from us in about a week then please give us a call at [phone].  

## 2010-12-07 NOTE — Progress Notes (Signed)
Subjective:    Patient ID: Donna Dyer, female    DOB: February 07, 1960, 50 y.o.   MRN: [id number]  HPI Nodule on her lower legs.  They are tender.  Says started after restarted lotrel.  Says took lotrel a years ago and had similar lesions. Stopped it nd didn't get any new lesion. She was restarted on Lotrel back in October and did well until her dose was increased about 2 weeks ago. That's when she first noticed the nodules recurring. She denies any other rashes. No respiratory problems.  +fam hx of sarcoidosis in 2 sibling. Also started getting muscle cramps. Dr. Jens Som checked her potassiu m an dit was low so startd her on potassium. Due for recheck in one week.   Thinks may be getting a cold last day or two. Sinus pressure, mild HA. No cough and maybe some mild congestion. No fever.    Review of Systems     BP 138/70  Pulse 68  Wt 207 lb (93.895 kg)    Allergies  Allergen Reactions  . Amlodipine Besylate     REACTION: muscle cramps  . Azithromycin   . Codeine   . Hctz (Hydrochlorothiazide)   . Metformin And Related Diarrhea  . Sulfonamide Derivatives     Past Medical History  Diagnosis Date  . Hyperlipidemia   . Hypertension   . Anemia   . Endometriosis   . Pain, joint, hip, right     chronic- Dr Charlann Boxer Dr Ethelene Hal  . Edema     LLE- podiatry in [place]  . Diabetes mellitus   . Goiter   . Hemorrhoids     Past Surgical History  Procedure Date  . Esophagogastroduodenoscopy 7-11    for anemia  . Total abdominal hysterectomy 1995    bilat oophorectomy/ endometriosis    History   Social History  . Marital Status: Married    Spouse Name: N/A    Number of Children: 2  . 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 Smoking status: Never Smoker   . Smokeless tobacco: Not on file  . Alcohol Use: No  . Drug Use: Not on file  . Sexually Active: Not on file   Other Topics Concern  .  Not on file   Social History Narrative  . No narrative on file    Family History  Problem Relation Age of Onset  . Hyperlipidemia Mother   . Cancer Father     bladder  . Diabetes Sister   . Diabetes Brother   . Diabetes Brother   . Diabetes Brother   . Diabetes Brother   . Heart attack Father     MI at age 65  . Sarcoidosis Brother   . Sarcoidosis Sister     Objective:   Physical Exam  Constitutional: She is oriented to person, place, and time. She appears well-developed and well-nourished.  HENT:  Head: Normocephalic and atraumatic.  Right Ear: External ear normal.  Left Ear: External ear normal.  Nose: Nose normal.  Mouth/Throat: Oropharynx is clear and moist.       TMs and canals are clear.   Eyes: Conjunctivae and EOM are normal. Pupils are equal, round, and reactive to light.  Neck: Neck supple. No thyromegaly present.  Cardiovascular: Normal rate, regular rhythm and normal heart sounds.   Pulmonary/Chest: Effort normal and breath sounds normal. She has no wheezes.  Lymphadenopathy:  She has no cervical adenopathy.  Neurological: She is alert and oriented to person, place, and time.  Skin: Skin is warm and dry.       On lower extremities has darkly pigmented papules on both legs. They range in size from about 1 cm to 2-1/2 cm. No pustules, ulcerations, or breaks in the skin. Some are older hyperpigmentation with no papules. No erythema. Trace ankle edema bilat.   Psychiatric: She has a normal mood and affect.          Assessment & Plan:  Nodules - Consider erythema nodosum even they they are more dark than red. She certainly has tender nodules. Consider this could be from the ACE inhibitor but this would be very unusual. I would like to check a sedimentation rate as well as a CBC. She has not any fevers which is reassuring that this is likely not infection. Also consider this could be a type of panniculitis. Interestingly she does have a positive family history  for sarcoidosis. She denies any pulmonary or lung symptoms but we could consider getting a chest x-ray for further evaluation. Also her blood work is normal we can consider sending her to dermatology for biopsy. Also will check for alpha-1 antitrypsin deficiency.  URI-her symptoms are very premature. If her symptoms get worse or persist for more the week to please call the office.

## 2010-12-08 ENCOUNTER — Other Ambulatory Visit: Payer: Self-pay | Admitting: Family Medicine

## 2010-12-08 DIAGNOSIS — R229 Localized swelling, mass and lump, unspecified: Secondary | ICD-10-CM

## 2010-12-08 DIAGNOSIS — L52 Erythema nodosum: Secondary | ICD-10-CM

## 2010-12-08 LAB — BASIC METABOLIC PANEL WITH GFR
BUN: 20 mg/dL (ref 6–23)
Calcium: 9.8 mg/dL (ref 8.4–10.5)
Creat: 0.93 mg/dL (ref 0.50–1.10)
GFR, Est African American: 82 mL/min
GFR, Est Non African American: 71 mL/min

## 2010-12-23 ENCOUNTER — Encounter: Payer: Self-pay | Admitting: Family Medicine

## 2010-12-27 ENCOUNTER — Encounter: Payer: Self-pay | Admitting: Family Medicine

## 2010-12-27 ENCOUNTER — Ambulatory Visit (INDEPENDENT_AMBULATORY_CARE_PROVIDER_SITE_OTHER): Payer: Commercial Managed Care - PPO | Admitting: Family Medicine

## 2010-12-27 ENCOUNTER — Other Ambulatory Visit: Payer: Self-pay | Admitting: *Deleted

## 2010-12-27 VITALS — BP 126/70 | HR 69 | Wt 210.0 lb

## 2010-12-27 DIAGNOSIS — E119 Type 2 diabetes mellitus without complications: Secondary | ICD-10-CM

## 2010-12-27 DIAGNOSIS — I10 Essential (primary) hypertension: Secondary | ICD-10-CM

## 2010-12-27 DIAGNOSIS — R229 Localized swelling, mass and lump, unspecified: Secondary | ICD-10-CM

## 2010-12-27 LAB — POCT GLYCOSYLATED HEMOGLOBIN (HGB A1C): Hemoglobin A1C: 6

## 2010-12-27 MED ORDER — METFORMIN HCL 500 MG PO TABS: ORAL_TABLET | ORAL | Status: DC

## 2010-12-27 MED ORDER — AZILSARTAN-CHLORTHALIDONE 40-12.5 MG PO TABS: 1.0000 | ORAL_TABLET | Freq: Every day | ORAL | Status: DC

## 2010-12-27 NOTE — Progress Notes (Signed)
  Subjective:    Patient ID: Donna Dyer, female    DOB: 1960-08-20, 50 y.o.   MRN: [id number]  HPI HTN - doing really well her current blood pressure regimen. No she feels her rash is getting worse. When she saw me about a month ago she said a similar rash it occurred when she was on Lotrel. Thus it is making me suspicious that she is having a reaction to the lisinopril. We did get her into dermatology and so is somewhat worried about erythema nodosum. She says unfortunately the dermatology appointment is about 3 weeks out. He feels the rash is spreading and now has noticed a few nodules on her upper arms.  DM - overall she says she is doing well. She has been trying to keep well and get regular exercise. She's really been trying to work on her weight as well. She takes her metformin regularly.. No lows. No polyuria or polydipsia. She is on ACE inhibitor.   Review of Systems     Objective:   Physical Exam  Constitutional: She is oriented to person, place, and time. She appears well-developed and well-nourished.  HENT:  Head: Normocephalic and atraumatic.  Cardiovascular: Normal rate, regular rhythm and normal heart sounds.   Pulmonary/Chest: Effort normal and breath sounds normal.  Neurological: She is alert and oriented to person, place, and time.  Skin: Skin is warm and dry.       She has small hyperpigmented subcutaneous papules across her lower legs above her ankles. She also has one on her right upper arm. No actual breaks in the skin. They're mildly tender. No erythema.  Psychiatric: She has a normal mood and affect. Her behavior is normal.          Assessment & Plan:  Nodules-still could be erythema nodosum, as nodules are a lot smaller than in other cases of erythema nodosum I have seen. Epigastric keep the appointment with dermatology in about 3 weeks. In the meantime certainly this could be some type of reaction to the ACE inhibitor since she has had this before on  Lotrel. We will stop lisinopril HCTZ for her blood pressure looks fantastic today. We will try changing her to an ARB. She believes she is taken out in the past and tolerated it well. She says when she takes hydrochlorothiazide by itself she tends to get cramps. We will try samples of Edarbyclor for 3 weeks until her appointment dermatology. I would like to see her back in about a month to make sure that her blood pressure is well controlled on the new regimen.  Diabetes-her A1c is 6.0 today which is fantastic. For now we'll continue the metformin since this regulating her sugars. I encouraged her to keep up the weight loss as this will really help. If she drops below 6.0 to we can consider stopping the metformin and have her work on diet and exercise only. Recheck A1c in 4 months.  Hypertension-We will stop lisinopril HCTZ for her blood pressure looks fantastic today. We will try changing her to an ARB. She believes she is taken out in the past and tolerated it well. She says when she takes hydrochlorothiazide by itself she tends to get cramps. We will try samples of Edarbyclor for 3 weeks until her appointment dermatology. I would like to see her back in about a month to make sure that her blood pressure is well controlled on the new regimen.

## 2011-01-27 ENCOUNTER — Ambulatory Visit (INDEPENDENT_AMBULATORY_CARE_PROVIDER_SITE_OTHER): Payer: Commercial Managed Care - PPO | Admitting: Family Medicine

## 2011-01-27 ENCOUNTER — Encounter: Payer: Self-pay | Admitting: Family Medicine

## 2011-01-27 DIAGNOSIS — L989 Disorder of the skin and subcutaneous tissue, unspecified: Secondary | ICD-10-CM

## 2011-01-27 DIAGNOSIS — I10 Essential (primary) hypertension: Secondary | ICD-10-CM

## 2011-01-27 MED ORDER — LOSARTAN POTASSIUM 100 MG PO TABS: 100.0000 mg | ORAL_TABLET | Freq: Every day | ORAL | Status: DC

## 2011-01-27 NOTE — Progress Notes (Signed)
  Subjective:    Patient ID: Donna Dyer, female    DOB: Feb 16, 1960, 51 y.o.   MRN: [id number]  HPI She is here today to followup on her blood pressure. We did make a change she says she's been itching more on the medication. She is still taking the stomach without any problems.  She did see the dermatologist for lesions on her ankles. He felt like it could possibly be sarcoid and was less convinced that it was erythema nodosum. He sent her for an eye exam and chest x-ray both of which were normal which is reassuring. He asked her to followup if she had any new lesions that he can see them in the active form. For now she mostly has hyperpigmented macular skin lesions. He is still convinced that this was triggered by being on the ACE inhibitor. In part because she has ACE inhibitor years ago and had a similar episode.   Review of Systems     Objective:   Physical Exam  Constitutional: She is oriented to person, place, and time. She appears well-developed and well-nourished.  HENT:  Head: Normocephalic and atraumatic.  Cardiovascular: Normal rate, regular rhythm and normal heart sounds.   Pulmonary/Chest: Effort normal and breath sounds normal.  Neurological: She is alert and oriented to person, place, and time.  Skin: Skin is warm and dry.       She still has several hyperpigmented macular skin lesions around her ankles. Mostly scarring.  Psychiatric: She has a normal mood and affect. Her behavior is normal.          Assessment & Plan:  Hypertension-we will discontinue the Edarbyclor and change her to losartan 100 mg. Continue diastolic. Followup in one month. Check BMP at that time.  Skin lesions-she will follow with dermatology for this. I'm very happy that her eye exam and chest x-ray were normal, which makes it less likely to be sarcoidosis. She does have a family history of sarcoidosis.

## 2011-01-30 ENCOUNTER — Encounter (HOSPITAL_BASED_OUTPATIENT_CLINIC_OR_DEPARTMENT_OTHER): Payer: Self-pay | Admitting: *Deleted

## 2011-01-30 ENCOUNTER — Inpatient Hospital Stay (HOSPITAL_BASED_OUTPATIENT_CLINIC_OR_DEPARTMENT_OTHER)
Admission: EM | Admit: 2011-01-30 | Discharge: 2011-01-31 | DRG: 395 | Disposition: A | Discharge status: Home / Self Care | Payer: Commercial Managed Care - PPO | Attending: Internal Medicine | Admitting: Internal Medicine

## 2011-01-30 DIAGNOSIS — E049 Nontoxic goiter, unspecified: Secondary | ICD-10-CM | POA: Diagnosis present

## 2011-01-30 DIAGNOSIS — Z7982 Long term (current) use of aspirin: Secondary | ICD-10-CM

## 2011-01-30 DIAGNOSIS — K648 Other hemorrhoids: Secondary | ICD-10-CM | POA: Diagnosis present

## 2011-01-30 DIAGNOSIS — E785 Hyperlipidemia, unspecified: Secondary | ICD-10-CM | POA: Diagnosis present

## 2011-01-30 DIAGNOSIS — M12859 Other specific arthropathies, not elsewhere classified, unspecified hip: Secondary | ICD-10-CM | POA: Diagnosis present

## 2011-01-30 DIAGNOSIS — Z91018 Allergy to other foods: Secondary | ICD-10-CM

## 2011-01-30 DIAGNOSIS — K625 Hemorrhage of anus and rectum: Secondary | ICD-10-CM | POA: Diagnosis present

## 2011-01-30 DIAGNOSIS — I10 Essential (primary) hypertension: Secondary | ICD-10-CM | POA: Diagnosis present

## 2011-01-30 DIAGNOSIS — E119 Type 2 diabetes mellitus without complications: Secondary | ICD-10-CM | POA: Diagnosis present

## 2011-01-30 DIAGNOSIS — Z79899 Other long term (current) drug therapy: Secondary | ICD-10-CM

## 2011-01-30 DIAGNOSIS — Z888 Allergy status to other drugs, medicaments and biological substances status: Secondary | ICD-10-CM

## 2011-01-30 LAB — CBC
Hemoglobin: 11.5 g/dL — ABNORMAL LOW (ref 12.0–15.0)
MCH: 27.5 pg (ref 26.0–34.0)
MCHC: 33.4 g/dL (ref 30.0–36.0)
MCV: 82.3 fL (ref 78.0–100.0)
RBC: 4.18 MIL/uL (ref 3.87–5.11)

## 2011-01-30 LAB — PROTIME-INR
INR: 1 (ref 0.00–1.49)
Prothrombin Time: 13.4 seconds (ref 11.6–15.2)

## 2011-01-30 LAB — DIFFERENTIAL
Basophils Relative: 0 % (ref 0–1)
Eosinophils Absolute: 0.2 10*3/uL (ref 0.0–0.7)
Eosinophils Relative: 2 % (ref 0–5)
Lymphs Abs: 3.3 10*3/uL (ref 0.7–4.0)
Monocytes Relative: 8 % (ref 3–12)

## 2011-01-30 LAB — COMPREHENSIVE METABOLIC PANEL
Albumin: 3.9 g/dL (ref 3.5–5.2)
Alkaline Phosphatase: 118 U/L — ABNORMAL HIGH (ref 39–117)
BUN: 12 mg/dL (ref 6–23)
Calcium: 9.7 mg/dL (ref 8.4–10.5)
Creatinine, Ser: 0.9 mg/dL (ref 0.50–1.10)
GFR calc Af Amer: 84 mL/min — ABNORMAL LOW (ref >90)
Glucose, Bld: 89 mg/dL (ref 70–99)
Total Protein: 8.4 g/dL — ABNORMAL HIGH (ref 6.0–8.3)

## 2011-01-30 LAB — APTT: aPTT: 32 seconds (ref 24–37)

## 2011-01-30 MED ORDER — SODIUM CHLORIDE 0.9 % IV BOLUS (SEPSIS)
500.0000 mL | Freq: Once | INTRAVENOUS | Status: AC
  Administered 2011-01-30: 1000 mL via INTRAVENOUS

## 2011-01-30 NOTE — ED Provider Notes (Addendum)
History   This chart was scribed for Hilario Quarry, MD by Melba Coon. The patient was seen in room MH11/MH11 and the patient's care was started at 4:11PM.    CSN: [id number]  Arrival date & time 01/30/11  1441   First MD Initiated Contact with Patient 01/30/11 1610      Chief Complaint  Patient presents with  . Rectal Bleeding    (Consider location/radiation/quality/duration/timing/severity/associated sxs/prior treatment) Patient is a 51 y.o. female presenting with hematochezia.  Rectal Bleeding    Donna Dyer is a 51 y.o. female who presents to the Emergency Department complaining of rectal bleeding began this a.m.  She has some abdominal discomfort constant, dull, epigastric moderate to severe abd pain pressurean onset this morning. Pt at first thought it was gas and a nml stool, but discovered gas mixed with blood (4x today). Decreased appetite present. Pt takes ASA but no blood thinners. Hx of hemorrhoids, but says it feels different than previous hemorrhoid bleeding. She has had colonoscopy for screening by Dr. Madilyn Fireman.  PCP: Dr. Lawrence Marseilles of [facility] in [place]   Past Medical History  Diagnosis Date  . Hyperlipidemia   . Hypertension   . Anemia   . Endometriosis   . Pain, joint, hip, right     chronic- Dr Charlann Boxer Dr Ethelene Hal  . Edema     LLE- podiatry in [place]  . Diabetes mellitus   . Goiter   . Hemorrhoids     Past Surgical History  Procedure Date  . Esophagogastroduodenoscopy 7-11    for anemia  . Total abdominal hysterectomy 1995    bilat oophorectomy/ endometriosis    Family History  Problem Relation Age of Onset  . Hyperlipidemia Mother   . Cancer Father     bladder  . Diabetes Sister   . Diabetes Brother   . Diabetes Brother   . Diabetes Brother   . Diabetes Brother   . Heart attack Father     MI at age 59  . Sarcoidosis Brother   . Sarcoidosis Sister     History  Substance Use Topics  . Smoking status: Never Smoker   . Smokeless  tobacco: Not on file  . Alcohol Use: No    OB History    Grav Para Term Preterm Abortions TAB SAB Ect Mult Living                  Review of Systems  Gastrointestinal: Positive for hematochezia.  10 Systems reviewed and are negative for acute change except as noted in the HPI.   Allergies  Amlodipine besylate; Codeine; Hctz; Metformin and related; Pineapple; Azithromycin; and Sulfonamide derivatives  Home Medications   Current Outpatient Rx  Name Route Sig Dispense Refill  . ASPIRIN 81 MG PO TABS Oral Take 81 mg by mouth. When remembers    . CALCIUM CARBONATE ANTACID 500 MG PO CHEW Oral Chew 1 tablet by mouth daily as needed. For indigestion    . VITAMIN D 2000 UNITS PO CAPS Oral Take 2,000 Units by mouth daily.     Marland Kitchen ESTRADIOL 0.1 MG/GM VA CREA Vaginal Place 2 g vaginally 2 (two) times a week. Use on Sunday and Wednesday    . LOSARTAN POTASSIUM 100 MG PO TABS Oral Take 1 tablet (100 mg total) by mouth daily. 30 tablet 1  . ONE-DAILY MULTI VITAMINS PO TABS Oral Take 1 tablet by mouth daily.      . NEBIVOLOL HCL 20 MG PO TABS Oral Take  1 tablet (20 mg total) by mouth daily. 90 tablet 1    BP 164/81  Pulse 74  Temp(Src) 98.3 F (36.8 C) (Oral)  Resp 18  Ht 5\' 6"  (1.676 m)  Wt 210 lb (95.255 kg)  BMI 33.89 kg/m2  SpO2 100%  Physical Exam  Constitutional: She is oriented to person, place, and time. She appears well-developed.  HENT:  Head: Normocephalic and atraumatic.  Eyes: Conjunctivae and EOM are normal. Pupils are equal, round, and reactive to light. No scleral icterus.  Neck: Normal range of motion. Neck supple. No thyromegaly present.  Cardiovascular: Normal rate, regular rhythm and normal heart sounds.  Exam reveals no gallop and no friction rub.   No murmur heard. Pulmonary/Chest: Effort normal and breath sounds normal. No stridor. She has no wheezes. She has no rales. She exhibits no tenderness.  Abdominal: Soft. She exhibits no distension. There is no  tenderness. There is no rebound.  Genitourinary:       Bloody rectal exam  Musculoskeletal: Normal range of motion. She exhibits no edema.  Lymphadenopathy:    She has no cervical adenopathy.  Neurological: She is oriented to person, place, and time. Coordination normal.  Skin: No rash noted. No erythema.  Psychiatric: She has a normal mood and affect. Her behavior is normal.    ED Course  Procedures (including critical care time)  DIAGNOSTIC STUDIES: Oxygen Saturation is 100% on room air, normal by my interpretation.    COORDINATION OF CARE:  Results for orders placed during the hospital encounter of 01/30/11  CBC      Component Value Range   WBC 8.3  4.0 - 10.5 (K/uL)   RBC 4.18  3.87 - 5.11 (MIL/uL)   Hemoglobin 11.5 (*) 12.0 - 15.0 (g/dL)   HCT 86.5 (*) 78.4 - 46.0 (%)   MCV 82.3  78.0 - 100.0 (fL)   MCH 27.5  26.0 - 34.0 (pg)   MCHC 33.4  30.0 - 36.0 (g/dL)   RDW 69.6  29.5 - 28.4 (%)   Platelets 280  150 - 400 (K/uL)  DIFFERENTIAL      Component Value Range   Neutrophils Relative 51  43 - 77 (%)   Neutro Abs 4.3  1.7 - 7.7 (K/uL)   Lymphocytes Relative 39  12 - 46 (%)   Lymphs Abs 3.3  0.7 - 4.0 (K/uL)   Monocytes Relative 8  3 - 12 (%)   Monocytes Absolute 0.6  0.1 - 1.0 (K/uL)   Eosinophils Relative 2  0 - 5 (%)   Eosinophils Absolute 0.2  0.0 - 0.7 (K/uL)   Basophils Relative 0  0 - 1 (%)   Basophils Absolute 0.0  0.0 - 0.1 (K/uL)  COMPREHENSIVE METABOLIC PANEL      Component Value Range   Sodium 140  135 - 145 (mEq/L)   Potassium 3.7  3.5 - 5.1 (mEq/L)   Chloride 101  96 - 112 (mEq/L)   CO2 31  19 - 32 (mEq/L)   Glucose, Bld 89  70 - 99 (mg/dL)   BUN 12  6 - 23 (mg/dL)   Creatinine, Ser 1.32  0.50 - 1.10 (mg/dL)   Calcium 9.7  8.4 - 44.0 (mg/dL)   Total Protein 8.4 (*) 6.0 - 8.3 (g/dL)   Albumin 3.9  3.5 - 5.2 (g/dL)   AST 23  0 - 37 (U/L)   ALT 23  0 - 35 (U/L)   Alkaline Phosphatase 118 (*) 39 - 117 (U/L)  Total Bilirubin 0.4  0.3 - 1.2 (mg/dL)     GFR calc non Af Amer 72 (*) >90 (mL/min)   GFR calc Af Amer 84 (*) >90 (mL/min)  PROTIME-INR      Component Value Range   Prothrombin Time 13.4  11.6 - 15.2 (seconds)   INR 1.00  0.00 - 1.49   APTT      Component Value Range   aPTT 32  24 - 37 (seconds)      Patient remains hemodynamically stable here.  Hemoglobin decreased by a gram from December.  Discussed with Dr. Gonzella Lex and plan transfer to [facility].  I personally performed the services described in this documentation, which was scribed in my presence. The recorded information has been reviewed and considered.     Hilario Quarry, MD 01/30/11 1900  Hilario Quarry, MD 01/30/11 1902

## 2011-01-30 NOTE — ED Notes (Signed)
Ray MD at bedside to discuss admission

## 2011-01-30 NOTE — ED Notes (Signed)
EDP Ray advised pt wishes to speak to her regarding admission

## 2011-01-30 NOTE — ED Notes (Signed)
Attempt x 1 to call report to [phone]. RN will call when able to take report

## 2011-01-30 NOTE — ED Notes (Signed)
Pt states she has seen blood in the toilet today several times. BM was formed and "green". Some abd pain. No other s/s.

## 2011-01-31 ENCOUNTER — Encounter (HOSPITAL_COMMUNITY): Payer: Self-pay | Admitting: Internal Medicine

## 2011-01-31 ENCOUNTER — Encounter (HOSPITAL_COMMUNITY)
Admission: EM | Disposition: A | Discharge status: Home / Self Care | Payer: Self-pay | Source: Home / Self Care | Attending: Internal Medicine

## 2011-01-31 DIAGNOSIS — K648 Other hemorrhoids: Secondary | ICD-10-CM | POA: Diagnosis present

## 2011-01-31 HISTORY — PX: FLEXIBLE SIGMOIDOSCOPY: SHX5431

## 2011-01-31 LAB — CBC
HCT: 34.5 % — ABNORMAL LOW (ref 36.0–46.0)
Hemoglobin: 10.8 g/dL — ABNORMAL LOW (ref 12.0–15.0)
Hemoglobin: 11.5 g/dL — ABNORMAL LOW (ref 12.0–15.0)
Hemoglobin: 11.8 g/dL — ABNORMAL LOW (ref 12.0–15.0)
MCHC: 33.3 g/dL (ref 30.0–36.0)
MCHC: 34.2 g/dL (ref 30.0–36.0)
MCV: 82.5 fL (ref 78.0–100.0)
MCV: 83.1 fL (ref 78.0–100.0)
Platelets: 267 10*3/uL (ref 150–400)
Platelets: 262 10*3/uL (ref 150–400)
RBC: 4.19 MIL/uL (ref 3.87–5.11)
RDW: 13.8 % (ref 11.5–15.5)
WBC: 8.9 10*3/uL (ref 4.0–10.5)
WBC: 8.3 10*3/uL (ref 4.0–10.5)
WBC: 7.7 10*3/uL (ref 4.0–10.5)
WBC: 8.5 10*3/uL (ref 4.0–10.5)

## 2011-01-31 LAB — BASIC METABOLIC PANEL
CO2: 26 mEq/L (ref 19–32)
Calcium: 8.9 mg/dL (ref 8.4–10.5)
GFR calc non Af Amer: >90 mL/min (ref >90)
Glucose, Bld: 85 mg/dL (ref 70–99)
Potassium: 3.7 mEq/L (ref 3.5–5.1)
Sodium: 139 mEq/L (ref 135–145)

## 2011-01-31 LAB — GLUCOSE, CAPILLARY
Glucose-Capillary: 89 mg/dL (ref 70–99)
Glucose-Capillary: 87 mg/dL (ref 70–99)

## 2011-01-31 SURGERY — SIGMOIDOSCOPY, FLEXIBLE
Anesthesia: Moderate Sedation

## 2011-01-31 MED ORDER — SODIUM CHLORIDE 0.9 % IJ SOLN
3.0000 mL | Freq: Two times a day (BID) | INTRAMUSCULAR | Status: DC
  Administered 2011-01-31 (×2): 3 mL via INTRAVENOUS

## 2011-01-31 MED ORDER — ONDANSETRON HCL 4 MG/2ML IJ SOLN: 4.0000 mg | Freq: Four times a day (QID) | INTRAMUSCULAR | Status: DC | PRN

## 2011-01-31 MED ORDER — FLEET ENEMA 7-19 GM/118ML RE ENEM
2.0000 | ENEMA | Freq: Once | RECTAL | Status: AC
  Administered 2011-01-31: 2 via RECTAL
  Filled 2011-01-31 (×2): qty 2

## 2011-01-31 MED ORDER — HYDROCORTISONE ACETATE 25 MG RE SUPP: 25.0000 mg | Freq: Two times a day (BID) | RECTAL | Status: DC

## 2011-01-31 MED ORDER — ACETAMINOPHEN 325 MG PO TABS: 650.0000 mg | ORAL_TABLET | Freq: Four times a day (QID) | ORAL | Status: DC | PRN

## 2011-01-31 MED ORDER — SODIUM CHLORIDE 0.9 % IJ SOLN: 3.0000 mL | INTRAMUSCULAR | Status: DC | PRN

## 2011-01-31 MED ORDER — SODIUM CHLORIDE 0.9 % IV SOLN: 250.0000 mL | INTRAVENOUS | Status: DC | PRN

## 2011-01-31 MED ORDER — ONDANSETRON HCL 4 MG PO TABS: 4.0000 mg | ORAL_TABLET | Freq: Four times a day (QID) | ORAL | Status: DC | PRN

## 2011-01-31 MED ORDER — MIDAZOLAM HCL 10 MG/2ML IJ SOLN
INTRAMUSCULAR | Status: AC
  Filled 2011-01-31: qty 2

## 2011-01-31 MED ORDER — NEBIVOLOL HCL 20 MG PO TABS
1.0000 | ORAL_TABLET | Freq: Every day | ORAL | Status: DC
  Filled 2011-01-31: qty 1

## 2011-01-31 MED ORDER — NEBIVOLOL HCL 10 MG PO TABS
20.0000 mg | ORAL_TABLET | Freq: Every day | ORAL | Status: DC
  Administered 2011-01-31: 20 mg via ORAL
  Filled 2011-01-31: qty 2

## 2011-01-31 MED ORDER — LOSARTAN POTASSIUM 50 MG PO TABS
100.0000 mg | ORAL_TABLET | Freq: Every day | ORAL | Status: DC
  Administered 2011-01-31: 100 mg via ORAL
  Filled 2011-01-31: qty 2

## 2011-01-31 MED ORDER — ACETAMINOPHEN 650 MG RE SUPP: 650.0000 mg | Freq: Four times a day (QID) | RECTAL | Status: DC | PRN

## 2011-01-31 MED ORDER — FENTANYL CITRATE 0.05 MG/ML IJ SOLN
INTRAMUSCULAR | Status: AC
  Filled 2011-01-31: qty 2

## 2011-01-31 NOTE — Progress Notes (Signed)
Utilization Review Completed.Donna Dyer T1/21/2013

## 2011-01-31 NOTE — Op Note (Signed)
[facility] [address] [place], [place]  [address]  FLEXIBLE SIGMOIDOSCOPY PROCEDURE REPORT  PATIENT:  Donna Dyer, Donna Dyer  MR#:  [id number] BIRTHDATE:  1960-08-18, 50 yrs. old  GENDER:  female  ENDOSCOPIST:  Dorena Cookey Referred by:  PROCEDURE DATE:  01/31/2011 PROCEDURE: ASA CLASS: INDICATIONS:   rectal bleeding  MEDICATIONS:  None  DESCRIPTION OF PROCEDURE:   After the risks benefits and alternatives of the procedure were thoroughly explained, informed consent was obtained.  No rectal exam performed. The EC-[id number] ([id number]) endoscope was introduced through the anus and advanced to the , without limitations.  The quality of the prep was .  The instrument was then slowly withdrawn as the mucosa was fully examined. <<PROCEDUREIMAGES>> Good prep to 60 cm. No abnormalities noted except a small amount of old blood in the distal rectum. Retroflex view the anus did reveal some moderate internal hemorrhoids with no discrete bleeding hemorrhoid or fissure appreciated. External exam revealed external hemorrhoids with no definite bleeding lesion or fissure. COMPLICATIONS:  None  ENDOSCOPIC IMPRESSION: Internal mental hemorrhoids with no significant bleeding at time of this procedure.  RECOMMENDATIONS: Expectant management topical therapy as needed  REPEAT EXAM:  No  ______________________________ Dorena Cookey  CC:  n. eSIGNED:   Dorena Cookey at 01/31/2011 03:25 PM  Romualdo Bolk, [phone]

## 2011-01-31 NOTE — Brief Op Note (Signed)
01/30/2011 - 01/31/2011  3:26 PM  PATIENT:  Donna Dyer  51 y.o. female  PRE-OPERATIVE DIAGNOSIS:  Rectal bleeding  POST-OPERATIVE DIAGNOSIS:  * No post-op diagnosis entered *  PROCEDURE:  Procedure(s): FLEXIBLE SIGMOIDOSCOPY  SURGEON:  Surgeon(s): Barrie Folk, MD  Normal study with exception of internal hemorrhoids with small amount of blood seen

## 2011-01-31 NOTE — H&P (Signed)
PCP:   Donna Gasser, MD, MD  Dr. Madilyn Fireman in GI  Chief Complaint:  BRBPR  HPI: 50yoF with h/o hemorrhoids, HTN, borderline DM, and prior episodes of BRBPR presents  with painless BRBPR.   Pt states that today while eating breakfast she had the urge to have a BM. She got up  to do the dishes and while doing so, passed some flatus uneventfully without any  abnormal sensation, however when she got the bathroom and looked at her underwear,  there was a fair amount of bright red blood (not marroon or melena) in her underwear.  She had her BM and notes that it was big and green, but not hard and she did not  strain to get it out. There was a small amount of BRBPR in the toilet but she cannot  say if it was linear streaks, however does state that it was a small amount and did  not fill the bowl. Through the day, she had two more episodes of passing gas (not a  BM) with small amounts of blood coming out each time.   There is no abdominal pain, no fevers, chills, sweats, feeling systemically ill, and  other than the BRBPR she feels totally fine and is without complaint.   She has had BRBPR a few times before in the past, and states it's not often. She had  a colonoscopy with Dr. Madilyn Fireman, this is listed in EPIC as 05/2008 but I am unable to  actually pull the results up. There is also an EGD listed in 07/2009, but again,  unable to find results in EPIC. She states that the colo showed hemorrhoids, but no  polyps and does not think any diverticuli. She doesn't know what the EGD showed.   She presented to [facility] ED. There, vitals were stable, actually running a bit more  hypertense. Labs significant for normal chem panel including renal 12/0.90, normal  LFT's. CBC with Hct 34.4 (last value recorded was 37.5 in 11/2010), otherwise with  normal plt count, INR 1.0. Rectal exam was noted to be bloody.   ROS as above o/w negative.   Past Medical History  Diagnosis Date  . Hyperlipidemia   .  Hypertension   . Anemia   . Endometriosis   . Pain, joint, hip, right     chronic- Dr Charlann Boxer Dr Ethelene Hal  . Edema     LLE- podiatry in [place]  . Diabetes mellitus   . Goiter   . Hemorrhoids     Past Surgical History  Procedure Date  . Esophagogastroduodenoscopy 7-11    for anemia  . Total abdominal hysterectomy 1995    bilat oophorectomy/ endometriosis   Medications:  HOME MEDS: Reconciled with pt Prior to Admission medications   Medication Sig Start Date End Date Taking? Authorizing Provider  calcium carbonate (TUMS - DOSED IN MG ELEMENTAL CALCIUM) 500 MG chewable tablet Chew 1 tablet by mouth daily as needed. For indigestion   Yes Historical Provider, MD  Cholecalciferol (VITAMIN D) 2000 UNITS CAPS Take 2,000 Units by mouth daily.    Yes Historical Provider, MD  estradiol (ESTRACE) 0.1 MG/GM vaginal cream Place 2 g vaginally 2 (two) times a week. Use on Sunday and Wednesday   Yes Historical Provider, MD  losartan (COZAAR) 100 MG tablet Take 1 tablet (100 mg total) by mouth daily. 01/27/11 01/27/12 Yes Donna Gasser, MD  Multiple Vitamin (MULTIVITAMIN) tablet Take 1 tablet by mouth daily.     Yes Historical Provider, MD  Nebivolol  HCl (BYSTOLIC) 20 MG TABS Take 1 tablet (20 mg total) by mouth daily. 08/23/10  Yes Seymour Bars, DO  aspirin 81 MG tablet Take 81 mg by mouth. When remembers    Historical Provider, MD    Allergies:  Allergies  Allergen Reactions  . Amlodipine Besylate     REACTION: muscle cramps  . Codeine Nausea And Vomiting  . Hctz (Hydrochlorothiazide)     Muscle spasms    . Metformin And Related Diarrhea  . Pineapple Itching    Tongue swells  . Azithromycin Itching and Rash  . Sulfonamide Derivatives Rash    Social History:   reports that she has never smoked. She has never used smokeless tobacco. She reports that she does not drink alcohol. Her drug history not on file. Lives in [place] with husband and daughter. Ambulatory, no cane or walker. Works at  [employer].   Family History: Family History  Problem Relation Age of Onset  . Hyperlipidemia Mother   . Cancer Father     bladder  . Diabetes Sister   . Diabetes Brother   . Diabetes Brother   . Diabetes Brother   . Diabetes Brother   . Heart attack Father     MI at age 87  . Sarcoidosis Brother   . Sarcoidosis Sister     Physical Exam: Filed Vitals:   01/30/11 1513 01/30/11 1807 01/30/11 2151 01/30/11 2252  BP: 164/81 148/75 169/81 187/94  Pulse: 74 68 62 72  Temp: 98.3 F (36.8 C)   98.8 F (37.1 C)  TempSrc: Oral   Oral  Resp: 18 16  18   Height: 5\' 6"  (1.676 m)     Weight: 95.255 kg (210 lb)   92.307 kg (203 lb 8 oz)  SpO2: 100% 100% 99% 95%   Blood pressure 187/94, pulse 72, temperature 98.8 F (37.1 C), temperature source Oral, resp. rate 18, height 5\' 6"  (1.676 m), weight 92.307 kg (203 lb 8 oz), SpO2 95.00%.  Gen: Overweight but overall healthy appearing F sitting at bedside chair, appears  very well, very pleasant, good historian. No distress, able to relate history well  without difficulty. HEENT: Pupils round, reactive, EOMI, sclera clear, normal. Mouth moist, tongue with  black hyperpigmentation Lungs: CTAB no w/c/r, normal air movement, overall normal Heart: RRR no m/g, normal exam Abd: Overweight, but soft, non tender, non distended, normal exam Extrem: Normal bulk and tone, normal perfusion, no BLE edema but with hyperpigmented  macules on BLE's over shins.  Neuro: Alert, attentive, CN 2-12 intact, moves extremities on her own, grossly non- focal.   Labs & Imaging Results for orders placed during the hospital encounter of 01/30/11 (from the past 48 hour(s))  CBC     Status: Abnormal   Collection Time   01/30/11  4:22 PM      Component Value Range Comment   WBC 8.3  4.0 - 10.5 (K/uL)    RBC 4.18  3.87 - 5.11 (MIL/uL)    Hemoglobin 11.5 (*) 12.0 - 15.0 (g/dL)    HCT 82.9 (*) 56.2 - 46.0 (%)    MCV 82.3  78.0 - 100.0 (fL)    MCH 27.5   26.0 - 34.0 (pg)    MCHC 33.4  30.0 - 36.0 (g/dL)    RDW 13.0  86.5 - 78.4 (%)    Platelets 280  150 - 400 (K/uL)   DIFFERENTIAL     Status: Normal   Collection Time   01/30/11  4:22 PM      Component Value Range Comment   Neutrophils Relative 51  43 - 77 (%)    Neutro Abs 4.3  1.7 - 7.7 (K/uL)    Lymphocytes Relative 39  12 - 46 (%)    Lymphs Abs 3.3  0.7 - 4.0 (K/uL)    Monocytes Relative 8  3 - 12 (%)    Monocytes Absolute 0.6  0.1 - 1.0 (K/uL)    Eosinophils Relative 2  0 - 5 (%)    Eosinophils Absolute 0.2  0.0 - 0.7 (K/uL)    Basophils Relative 0  0 - 1 (%)    Basophils Absolute 0.0  0.0 - 0.1 (K/uL)   COMPREHENSIVE METABOLIC PANEL     Status: Abnormal   Collection Time   01/30/11  4:22 PM      Component Value Range Comment   Sodium 140  135 - 145 (mEq/L)    Potassium 3.7  3.5 - 5.1 (mEq/L)    Chloride 101  96 - 112 (mEq/L)    CO2 31  19 - 32 (mEq/L)    Glucose, Bld 89  70 - 99 (mg/dL)    BUN 12  6 - 23 (mg/dL)    Creatinine, Ser 9.60  0.50 - 1.10 (mg/dL)    Calcium 9.7  8.4 - 10.5 (mg/dL)    Total Protein 8.4 (*) 6.0 - 8.3 (g/dL)    Albumin 3.9  3.5 - 5.2 (g/dL)    AST 23  0 - 37 (U/L)    ALT 23  0 - 35 (U/L)    Alkaline Phosphatase 118 (*) 39 - 117 (U/L)    Total Bilirubin 0.4  0.3 - 1.2 (mg/dL)    GFR calc non Af Amer 72 (*) >90 (mL/min)    GFR calc Af Amer 84 (*) >90 (mL/min)   PROTIME-INR     Status: Normal   Collection Time   01/30/11  4:22 PM      Component Value Range Comment   Prothrombin Time 13.4  11.6 - 15.2 (seconds)    INR 1.00  0.00 - 1.49    APTT     Status: Normal   Collection Time   01/30/11  4:22 PM      Component Value Range Comment   aPTT 32  24 - 37 (seconds)    No results found.  Impression Present on Admission:  .Bright red blood per rectum .Essential hypertension, benign  50yoF with h/o hemorrhoids, HTN, borderline DM, and prior episodes of BRBPR presents  with BRBPR.  1. Bright red blood per rectum: With Hct drop ~3 pts since  11/2010. This sounds like  a lower GIB by history, with hemorrhoids, diverticuli, and AVM's topping the DDx. She  does have h/o hemorrhoids and these were seen on last colonoscopy, however pt denies  any recent constipation, straining, or hard BM's making this less likely. Also making  it less likely is that she is passing BRBPR with only episodes of passing flatus.    In the abscence of any abdominal pain, systemic symptoms, or leukocytosis, I think  colitis, infectious vs ischemic vs inflammtory, is much less likely.   - Holding ASA 81 daily, trend Hct q6 x4 for next day. Clear liquid diet for now,  consider advancing if Hct stable. No indication for transfusion at present.  - Recommend GI consult in the am for consideration of inpt vs outpt colonoscopy   2. HTN: Slightly elevated through [facility] course, will  hold HTN meds overnight given  GIB, but if stable through tomorrow likely OK to restart.   Regular bed, [facility] team 6 Full code, discussed with pt   Other plans as per orders.  Donna Dyer 01/31/2011, 12:06 AM

## 2011-01-31 NOTE — Consult Note (Signed)
[facility] Consult Note  Referring Provider: No ref. provider found Primary Care Physician:  [name], MD, MD Primary Gastroenterologist:  Dr.  Chief Complaint: Rectal bleeding HPI: Donna Dyer is an 51 y.o. black female. He was admitted with rectal bleeding with no pain no hemodynamic instability. She's had an EGD and colonoscopy in the last 2 years which are unrevealing except for internal hemorrhoids.  Past Medical History  Diagnosis Date  . Hyperlipidemia   . Hypertension   . Anemia   . Endometriosis   . Pain, joint, hip, right     chronic- Dr Charlann Boxer Dr Ethelene Hal  . Edema     [facility]- podiatry in [place]  . Diabetes mellitus   . Goiter   . Hemorrhoids     Past Surgical History  Procedure Date  . Esophagogastroduodenoscopy 7-11    for anemia  . Total abdominal hysterectomy 1995    bilat oophorectomy/ endometriosis    Medications Prior to Admission  Medication Dose Route Frequency Provider Last Rate Last Dose  . 0.9 %  sodium chloride infusion  250 mL Intravenous PRN Carlota Raspberry, MD      . acetaminophen (TYLENOL) tablet 650 mg  650 mg Oral Q6H PRN Carlota Raspberry, MD       Or  . acetaminophen (TYLENOL) suppository 650 mg  650 mg Rectal Q6H PRN Carlota Raspberry, MD      . losartan (COZAAR) tablet 100 mg  100 mg Oral Daily Isidor Holts, MD      . nebivolol (BYSTOLIC) tablet 20 mg  20 mg Oral Daily Isidor Holts, MD   20 mg at 01/31/11 1156  . ondansetron (ZOFRAN) tablet 4 mg  4 mg Oral Q6H PRN Carlota Raspberry, MD       Or  . ondansetron [facility]) injection 4 mg  4 mg Intravenous Q6H PRN Carlota Raspberry, MD      . sodium chloride 0.9 % bolus 500 mL  500 mL Intravenous Once Hilario Quarry, MD   1,000 mL at 01/30/11 1641  . sodium chloride 0.9 % injection 3 mL  3 mL Intravenous Q12H Carlota Raspberry, MD   3 mL at 01/31/11 1016  . sodium chloride 0.9 % injection 3 mL  3 mL Intravenous PRN Carlota Raspberry, MD      . sodium phosphate (FLEET) 7-19 GM/118ML enema 2 enema  2 enema Rectal Once  Barrie Folk, MD   2 enema at 01/31/11 1415  . DISCONTD: Nebivolol HCl TABS 20 mg  1 tablet Oral Daily Isidor Holts, MD       Medications Prior to Admission  Medication Sig Dispense Refill  . calcium carbonate (TUMS - DOSED IN MG ELEMENTAL CALCIUM) 500 MG chewable tablet Chew 1 tablet by mouth daily as needed. For indigestion      . Cholecalciferol (VITAMIN D) 2000 UNITS CAPS Take 2,000 Units by mouth daily.       Marland Kitchen estradiol (ESTRACE) 0.1 MG/GM vaginal cream Place 2 g vaginally 2 (two) times a week. Use on Sunday and Wednesday      . losartan (COZAAR) 100 MG tablet Take 1 tablet (100 mg total) by mouth daily.  30 tablet  1  . Multiple Vitamin (MULTIVITAMIN) tablet Take 1 tablet by mouth daily.        . Nebivolol HCl (BYSTOLIC) 20 MG TABS Take 1 tablet (20 mg total) by mouth daily.  90 tablet  1  . aspirin 81 MG tablet Take 81 mg by mouth. When remembers  Allergies:  Allergies  Allergen Reactions  . Amlodipine Besylate     REACTION: muscle cramps  . Codeine Nausea And Vomiting  . Hctz (Hydrochlorothiazide)     Muscle spasms    . Metformin And Related Diarrhea  . Pineapple Itching    Tongue swells  . Azithromycin Itching and Rash  . Sulfonamide Derivatives Rash    Family History  Problem Relation Age of Onset  . Hyperlipidemia Mother   . Cancer Father     bladder  . Heart attack Father     MI at age 5  . Diabetes Sister   . Diabetes Brother   . Diabetes Brother   . Diabetes Brother   . Diabetes Brother   . Sarcoidosis Brother   . Sarcoidosis Sister   . Anesthesia problems Neg Hx   . Hypotension Neg Hx   . Malignant hyperthermia Neg Hx   . Pseudochol deficiency Neg Hx     Social History:  reports that she has never smoked. She has never used smokeless tobacco. She reports that she does not drink alcohol or use illicit drugs.  Negative except for as above   Blood pressure 149/113, pulse 74, temperature 99 F (37.2 C), temperature source Oral, resp. rate  15, height 5\' 6"  (1.676 m), weight 92.08 kg (203 lb), SpO2 100.00%. Head: Normocephalic, without obvious abnormality, atraumatic Neck: no adenopathy, no carotid bruit, no JVD, supple, symmetrical, trachea midline and thyroid not enlarged, symmetric, no tenderness/mass/nodules Resp: clear to auscultation bilaterally Cardio: regular rate and rhythm, S1, S2 normal, no murmur, click, rub or gallop GI: Abdomen soft nondistended with normoactive bowel sounds hepatomegaly masses or guarding Extremities: extremities normal, atraumatic, no cyanosis or edema  Results for orders placed during the hospital encounter of 01/30/11 (from the past 48 hour(s))  CBC     Status: Abnormal   Collection Time   01/30/11  4:22 PM      Component Value Range Comment   WBC 8.3  4.0 - 10.5 (K/uL)    RBC 4.18  3.87 - 5.11 (MIL/uL)    Hemoglobin 11.5 (*) 12.0 - 15.0 (g/dL)    HCT 16.1 (*) 09.6 - 46.0 (%)    MCV 82.3  78.0 - 100.0 (fL)    MCH 27.5  26.0 - 34.0 (pg)    MCHC 33.4  30.0 - 36.0 (g/dL)    RDW 04.5  40.9 - 81.1 (%)    Platelets 280  150 - 400 (K/uL)   DIFFERENTIAL     Status: Normal   Collection Time   01/30/11  4:22 PM      Component Value Range Comment   Neutrophils Relative 51  43 - 77 (%)    Neutro Abs 4.3  1.7 - 7.7 (K/uL)    Lymphocytes Relative 39  12 - 46 (%)    Lymphs Abs 3.3  0.7 - 4.0 (K/uL)    Monocytes Relative 8  3 - 12 (%)    Monocytes Absolute 0.6  0.1 - 1.0 (K/uL)    Eosinophils Relative 2  0 - 5 (%)    Eosinophils Absolute 0.2  0.0 - 0.7 (K/uL)    Basophils Relative 0  0 - 1 (%)    Basophils Absolute 0.0  0.0 - 0.1 (K/uL)   COMPREHENSIVE METABOLIC PANEL     Status: Abnormal   Collection Time   01/30/11  4:22 PM      Component Value Range Comment   Sodium 140  135 - 145 (mEq/L)  Potassium 3.7  3.5 - 5.1 (mEq/L)    Chloride 101  96 - 112 (mEq/L)    CO2 31  19 - 32 (mEq/L)    Glucose, Bld 89  70 - 99 (mg/dL)    BUN 12  6 - 23 (mg/dL)    Creatinine, Ser 1.61  0.50 - 1.10 (mg/dL)     Calcium 9.7  8.4 - 10.5 (mg/dL)    Total Protein 8.4 (*) 6.0 - 8.3 (g/dL)    Albumin 3.9  3.5 - 5.2 (g/dL)    AST 23  0 - 37 (U/L)    ALT 23  0 - 35 (U/L)    Alkaline Phosphatase 118 (*) 39 - 117 (U/L)    Total Bilirubin 0.4  0.3 - 1.2 (mg/dL)    GFR calc non Af Amer 72 (*) >90 (mL/min)    GFR calc Af Amer 84 (*) >90 (mL/min)   PROTIME-INR     Status: Normal   Collection Time   01/30/11  4:22 PM      Component Value Range Comment   Prothrombin Time 13.4  11.6 - 15.2 (seconds)    INR 1.00  0.00 - 1.49    APTT     Status: Normal   Collection Time   01/30/11  4:22 PM      Component Value Range Comment   aPTT 32  24 - 37 (seconds)   CBC     Status: Abnormal   Collection Time   01/31/11 12:31 AM      Component Value Range Comment   WBC 8.9  4.0 - 10.5 (K/uL)    RBC 3.99  3.87 - 5.11 (MIL/uL)    Hemoglobin 11.0 (*) 12.0 - 15.0 (g/dL)    HCT 09.6 (*) 04.5 - 46.0 (%)    MCV 82.5  78.0 - 100.0 (fL)    MCH 27.6  26.0 - 34.0 (pg)    MCHC 33.4  30.0 - 36.0 (g/dL)    RDW 40.9  81.1 - 91.4 (%)    Platelets 267  150 - 400 (K/uL)   GLUCOSE, CAPILLARY     Status: Normal   Collection Time   01/31/11  1:27 AM      Component Value Range Comment   Glucose-Capillary 89  70 - 99 (mg/dL)    Comment 1 Notify RN     CBC     Status: Abnormal   Collection Time   01/31/11  6:00 AM      Component Value Range Comment   WBC 8.3  4.0 - 10.5 (K/uL)    RBC 3.93  3.87 - 5.11 (MIL/uL)    Hemoglobin 10.8 (*) 12.0 - 15.0 (g/dL)    HCT 78.2 (*) 95.6 - 46.0 (%)    MCV 82.4  78.0 - 100.0 (fL)    MCH 27.5  26.0 - 34.0 (pg)    MCHC 33.3  30.0 - 36.0 (g/dL)    RDW 21.3  08.6 - 57.8 (%)    Platelets 260  150 - 400 (K/uL)   BASIC METABOLIC PANEL     Status: Normal   Collection Time   01/31/11  6:00 AM      Component Value Range Comment   Sodium 139  135 - 145 (mEq/L)    Potassium 3.7  3.5 - 5.1 (mEq/L)    Chloride 103  96 - 112 (mEq/L)    CO2 26  19 - 32 (mEq/L)    Glucose, Bld 85  70 - 99 (  mg/dL)    BUN  9  6 - 23 (mg/dL)    Creatinine, Ser 1.61  0.50 - 1.10 (mg/dL)    Calcium 8.9  8.4 - 10.5 (mg/dL)    GFR calc non Af Amer >90  >90 (mL/min)    GFR calc Af Amer >90  >90 (mL/min)   CBC     Status: Abnormal   Collection Time   01/31/11 11:30 AM      Component Value Range Comment   WBC 7.7  4.0 - 10.5 (K/uL)    RBC 4.19  3.87 - 5.11 (MIL/uL)    Hemoglobin 11.5 (*) 12.0 - 15.0 (g/dL)    HCT 09.6 (*) 04.5 - 46.0 (%)    MCV 83.1  78.0 - 100.0 (fL)    MCH 27.4  26.0 - 34.0 (pg)    MCHC 33.0  30.0 - 36.0 (g/dL)    RDW 40.9  81.1 - 91.4 (%)    Platelets 262  150 - 400 (K/uL)   GLUCOSE, CAPILLARY     Status: Normal   Collection Time   01/31/11  2:49 PM      Component Value Range Comment   Glucose-Capillary 87  70 - 99 (mg/dL)    Physical sigmoidoscopy revealed only small internal hemorrhoids with minimal blood in the rectal vault otherwise normal to 60 cm Assessment: Hemodynamically and significant lower GI bleeding from hemorrhoids cannot rule out small fissure Plan:  Expectant management and outpatient followup Lataja Newland C 01/31/2011, 3:29 PM

## 2011-01-31 NOTE — Discharge Summary (Signed)
Physician Discharge Summary  Patient ID: JASMAIN AHLBERG MRN: [id number] DOB/AGE: Apr 03, 1960 51 y.o.  Admit date: 01/30/2011 Discharge date: 01/31/2011  Primary Care Physician:  [name], MD, MD   Discharge Diagnoses:    Patient Active Problem List  Diagnoses  . HIP PAIN, RIGHT  . FOOT PAIN, LEFT  . Edema  . Essential hypertension, benign  . CARPOPEDAL SPASM  . Diabetes mellitus  . Bright red blood per rectum  . Internal hemorrhoids    Medication List  As of 01/31/2011  7:22 PM   TAKE these medications         aspirin 81 MG tablet   Take 81 mg by mouth. When remembers      calcium carbonate 500 MG chewable tablet   Commonly known as: TUMS - dosed in mg elemental calcium   Chew 1 tablet by mouth daily as needed. For indigestion      estradiol 0.1 MG/GM vaginal cream   Commonly known as: ESTRACE   Place 2 g vaginally 2 (two) times a week. Use on Sunday and Wednesday      hydrocortisone 25 MG suppository   Commonly known as: ANUSOL-HC   Place 1 suppository (25 mg total) rectally 2 (two) times daily.      losartan 100 MG tablet   Commonly known as: COZAAR   Take 1 tablet (100 mg total) by mouth daily.      multivitamin tablet   Take 1 tablet by mouth daily.      Nebivolol HCl 20 MG Tabs   Take 1 tablet (20 mg total) by mouth daily.      Vitamin D 2000 UNITS Caps   Take 2,000 Units by mouth daily.             Disposition and Follow-up:  Follow up routinely, with primary MD.  Consults:  GI  Dr Dorena Cookey, GI.  Significant Diagnostic Studies:  No results found.  Brief H and P: For complete details, refer to admission H and P. However, in brief, this is a 51 year old female, with h/o hemorrhoids, HTN, borderline DM, and prior episodes of hematochezia,  Presenting with recurrent, painless passage of bright red blood per rectum. She was admitted for further evaluation, investigation and management.  Physical Exam: On 01/31/11. General:  Comfortable, alert, communicative, fully oriented, not short of breath at rest.  HEENT: Mild clinical pallor, no jaundice, no conjunctival injection or discharge.  NECK: Supple, JVP not seen, no carotid bruits, no palpable lymphadenopathy, no palpable goiter.  CHEST: Clinically clear to auscultation, no wheezes, no crackles.  HEART: Sounds 1 and 2 heard, normal, regular, no murmurs.  ABDOMEN: Moderately obese, soft, non-tender, no palpable organomegaly, no palpable masses, normal bowel sounds.  GENITALIA: Not examined.  LOWER EXTREMITIES: No pitting edema, palpable peripheral pulses.  MUSCULOSKELETAL SYSTEM: Unremarkable.  CENTRAL NERVOUS SYSTEM: No focal neurologic deficit on gross examination.   Hospital Course:  Principal Problem:  *Bright red blood per rectum: Patient presented as described above, with painless hematochezia. She was placed on observation, allowed clear fluids, serial CBCs were checked. She had no recurrence overnight, and hemoglobin remained stable. Dr Madilyn Fireman, gastroenterologist was consulted, and performed a flexible sigmoidoscopy on 01/31/11, which showed no active bleeding, but demonstrated internal hemorrhoids. He has recommended topical management.  Active Problems:  1. Essential hypertension, benign: This was sub-optimally controlled, and was managed with pre-admission anti-hypertensives.  2. Questionable history of Diabetes: CBGs remained normal, during this hospitalization. 3. Internal hemorrhoids: This appears  to be the culprit for patient's presenting hematochezia. As described above, topical management has been recommended.  Comment: Patient was considered clinically stable for discharge on 01/31/11. She may return to regular duties on 02/02/2011.  Time spent on Discharge: 35 mins.  Signed: Aiyannah Fayad,CHRISTOPHER 01/31/2011, 7:22 PM

## 2011-01-31 NOTE — Progress Notes (Signed)
Subjective: Asymtomatic, overnight.  Objective: Vital signs in last 24 hours: Temp:  [98.1 F (36.7 C)-98.8 F (37.1 C)] 98.3 F (36.8 C) (01/21 0452) Pulse Rate:  [62-74] 63  (01/21 0452) Resp:  [16-18] 18  (01/21 0452) BP: (148-187)/(75-94) 164/88 mmHg (01/21 0452) SpO2:  [93 %-100 %] 94 % (01/21 0452) Weight:  [92.307 kg (203 lb 8 oz)-95.255 kg (210 lb)] 92.307 kg (203 lb 8 oz) (01/20 2252) Weight change:  Last BM Date: 01/30/11  Intake/Output from previous day: 01/20 0701 - 01/21 0700 In: 240 [P.O.:240] Out: -  Total I/O In: 360 [P.O.:360] Out: -    Physical Exam: General: Comfortable, alert, communicative, fully oriented, not short of breath at rest.  HEENT:  Mild clinical pallor, no jaundice, no conjunctival injection or discharge. NECK:  Supple, JVP not seen, no carotid bruits, no palpable lymphadenopathy, no palpable goiter. CHEST:  Clinically clear to auscultation, no wheezes, no crackles. HEART:  Sounds 1 and 2 heard, normal, regular, no murmurs. ABDOMEN:  Moderately obese, soft, non-tender, no palpable organomegaly, no palpable masses, normal bowel sounds. GENITALIA:  Not examined. LOWER EXTREMITIES:  No pitting edema, palpable peripheral pulses. MUSCULOSKELETAL SYSTEM:  Unremarkable. CENTRAL NERVOUS SYSTEM:  No focal neurologic deficit on gross examination.  Lab Results:  Basename 01/31/11 0600 01/31/11 0031  WBC 8.3 8.9  HGB 10.8* 11.0*  HCT 32.4* 32.9*  PLT 260 267    Basename 01/31/11 0600 01/30/11 1622  NA 139 140  K 3.7 3.7  CL 103 101  CO2 26 31  GLUCOSE 85 89  BUN 9 12  CREATININE 0.77 0.90  CALCIUM 8.9 9.7   No results found for this or any previous visit (from the past 240 hour(s)).   Studies/Results: No results found.  Medications: Scheduled Meds:   . sodium chloride  500 mL Intravenous Once  . sodium chloride  3 mL Intravenous Q12H   Continuous Infusions:  PRN Meds:.sodium chloride, acetaminophen, acetaminophen, ondansetron  (ZOFRAN) IV, ondansetron, sodium chloride  Assessment/Plan:  Principal Problem:  *Bright red blood per rectum: Painless hematochezia. No recurrence overnight, and hemoglobin is stable. Suspect diverticular origin, although patient has known history of hemorrhoids. Polyp or other etiology has to be ruled out, however. Will consult Dr Ellsworth Lennox al, for possible endoscopic evaluation. Meanwhile, continue clear fluids and follow Hb. Active Problems:  1. Essential hypertension, benign: Uncontrolled. Will continue pre-admission anti-hypertensives.  2. Questionable history of Diabetes: CBGs are normal.  Disp: Per GI.    LOS: 1 day   Bethany Hirt,CHRISTOPHER 01/31/2011, 9:17 AM

## 2011-02-11 ENCOUNTER — Encounter (HOSPITAL_COMMUNITY): Payer: Self-pay | Admitting: Gastroenterology

## 2011-02-23 ENCOUNTER — Encounter: Payer: Self-pay | Admitting: *Deleted

## 2011-02-28 ENCOUNTER — Ambulatory Visit (INDEPENDENT_AMBULATORY_CARE_PROVIDER_SITE_OTHER): Payer: Commercial Managed Care - PPO | Admitting: Family Medicine

## 2011-02-28 ENCOUNTER — Encounter: Payer: Self-pay | Admitting: Family Medicine

## 2011-02-28 VITALS — BP 162/85 | HR 74 | Wt 214.0 lb

## 2011-02-28 DIAGNOSIS — I10 Essential (primary) hypertension: Secondary | ICD-10-CM

## 2011-02-28 DIAGNOSIS — E042 Nontoxic multinodular goiter: Secondary | ICD-10-CM | POA: Insufficient documentation

## 2011-02-28 DIAGNOSIS — R0683 Snoring: Secondary | ICD-10-CM

## 2011-02-28 DIAGNOSIS — G4710 Hypersomnia, unspecified: Secondary | ICD-10-CM

## 2011-02-28 MED ORDER — METOPROLOL TARTRATE 100 MG PO TABS: 100.0000 mg | ORAL_TABLET | Freq: Two times a day (BID) | ORAL | Status: DC

## 2011-02-28 MED ORDER — VERAPAMIL HCL ER 120 MG PO TBCR: 120.0000 mg | EXTENDED_RELEASE_TABLET | Freq: Every day | ORAL | Status: DC

## 2011-02-28 NOTE — Patient Instructions (Signed)
We will add metoprolol to you losartan.   Continue to work on low salt diet.

## 2011-02-28 NOTE — Progress Notes (Signed)
  Subjective:    Patient ID: Donna Dyer, female    DOB: [birth year], 51 y.o.   MRN: [id number]  HPI  HTN - doing well on current regimen. Bp is still not well controlled. Eats a low sat diet. No CP or SOB.  Taking meds regularly.  She does snore and complaints of fatigue and daytime sleepiness. She does occasionally wake up with headaches in the morning.  Review of Systems     Objective:   Physical Exam  Constitutional: She is oriented to person, place, and time. She appears well-developed and well-nourished.  HENT:  Head: Normocephalic and atraumatic.  Cardiovascular: Normal rate, regular rhythm and normal heart sounds.   Pulmonary/Chest: Effort normal and breath sounds normal.  Neurological: She is alert and oriented to person, place, and time.  Skin: Skin is warm and dry.  Psychiatric: She has a normal mood and affect. Her behavior is normal.          Assessment & Plan:  HTN- Still not well controlled on losartan and bystolic.  She has had a reneal [place] in the past that was norma. She does snore and has never been tested for sleep apnea. We will make a referral. I do feel that she has persistent hypertension. We will add verapamil to her regimen and followup in 4-6 weeks. She says she has taken verapamil in the past but cannot remember why it was discontinued.

## 2011-03-07 ENCOUNTER — Telehealth: Payer: Self-pay | Admitting: *Deleted

## 2011-03-07 MED ORDER — CHROMIUM PICOLINATE 500 MCG PO TABS: 1.0000 | ORAL_TABLET | Freq: Every day | ORAL | Status: DC

## 2011-03-07 NOTE — Telephone Encounter (Signed)
Should be ok to tak med. Just make sure BP is not dropped too much or getting palpitations.

## 2011-03-07 NOTE — Telephone Encounter (Signed)
Pt would like to know if it is safe for her to take chromium picolinate?

## 2011-03-08 NOTE — Telephone Encounter (Signed)
Pt notified of MD instructions. KJ LPN 

## 2011-03-23 ENCOUNTER — Other Ambulatory Visit: Payer: Self-pay | Admitting: *Deleted

## 2011-03-23 MED ORDER — NEBIVOLOL HCL 20 MG PO TABS: 1.0000 | ORAL_TABLET | Freq: Every day | ORAL | Status: DC

## 2011-03-25 ENCOUNTER — Other Ambulatory Visit: Payer: Self-pay | Admitting: *Deleted

## 2011-03-25 MED ORDER — NEBIVOLOL HCL 20 MG PO TABS: 1.0000 | ORAL_TABLET | Freq: Every day | ORAL | Status: DC

## 2011-03-28 ENCOUNTER — Ambulatory Visit (INDEPENDENT_AMBULATORY_CARE_PROVIDER_SITE_OTHER): Payer: Commercial Managed Care - PPO | Admitting: Family Medicine

## 2011-03-28 ENCOUNTER — Encounter: Payer: Self-pay | Admitting: Family Medicine

## 2011-03-28 VITALS — BP 150/93 | HR 55 | Ht 66.0 in | Wt 214.0 lb

## 2011-03-28 DIAGNOSIS — I10 Essential (primary) hypertension: Secondary | ICD-10-CM

## 2011-03-28 NOTE — Progress Notes (Signed)
  Subjective:    Patient ID: Donna Dyer, female    DOB: 06-24-1960, 51 y.o.   MRN: [id number]  HPI  HTN - She is here to followup on her blood pressure. Adjust pain or shortness of breath. She can take her medications regularly but once we reviewed her medication she is actually not taking losartan. She got the verapamil was in place about 1. Though she has tolerated verapamil well without any side effects  Review of Systems     Objective:   Physical Exam  Constitutional: She is oriented to person, place, and time. She appears well-developed and well-nourished.  HENT:  Head: Normocephalic and atraumatic.  Cardiovascular: Normal rate, regular rhythm and normal heart sounds.   Pulmonary/Chest: Effort normal and breath sounds normal.  Neurological: She is alert and oriented to person, place, and time.  Skin: Skin is warm and dry.  Psychiatric: She has a normal mood and affect. Her behavior is normal.          Assessment & Plan:  HTN - She was not taking the losartan.  Asked her to restart this and f/u in one month to recheck her BP.   She had her cholesterol checked through work. She will try to fax me a copy of those.

## 2011-03-29 ENCOUNTER — Telehealth: Payer: Self-pay | Admitting: Family Medicine

## 2011-03-29 DIAGNOSIS — G4733 Obstructive sleep apnea (adult) (pediatric): Secondary | ICD-10-CM | POA: Insufficient documentation

## 2011-03-29 NOTE — Telephone Encounter (Signed)
The results of her sleep study showed that she does have mild obstructive sleep apnea with severe snoring. Since she did have a positive test she would be a good candidate for a trial of CPAP which is the breathing mask. I would like her to see the neurologist who read her spleep study to discuss further treatment. They should be contacting her soon.

## 2011-03-30 DIAGNOSIS — IMO0002 Reserved for concepts with insufficient information to code with codable children: Secondary | ICD-10-CM | POA: Insufficient documentation

## 2011-03-30 NOTE — Telephone Encounter (Signed)
No answer and unable to leave vm because vm was full

## 2011-04-05 NOTE — Telephone Encounter (Signed)
Let mail her a letter.

## 2011-04-06 NOTE — Telephone Encounter (Signed)
Letter mailed

## 2011-04-14 ENCOUNTER — Ambulatory Visit (INDEPENDENT_AMBULATORY_CARE_PROVIDER_SITE_OTHER): Payer: Commercial Managed Care - PPO | Admitting: Family Medicine

## 2011-04-14 ENCOUNTER — Encounter: Payer: Self-pay | Admitting: Family Medicine

## 2011-04-14 VITALS — BP 141/81 | HR 78 | Temp 98.8°F | Ht 66.0 in | Wt 210.0 lb

## 2011-04-14 DIAGNOSIS — J069 Acute upper respiratory infection, unspecified: Secondary | ICD-10-CM

## 2011-04-14 DIAGNOSIS — J0120 Acute ethmoidal sinusitis, unspecified: Secondary | ICD-10-CM

## 2011-04-14 DIAGNOSIS — J329 Chronic sinusitis, unspecified: Secondary | ICD-10-CM

## 2011-04-14 MED ORDER — CEFUROXIME AXETIL 500 MG PO TABS: 500.0000 mg | ORAL_TABLET | Freq: Two times a day (BID) | ORAL | Status: AC

## 2011-04-14 MED ORDER — PSEUDOEPHEDRINE-GUAIFENESIN ER 120-1200 MG PO TB12: 1.0000 | ORAL_TABLET | Freq: Two times a day (BID) | ORAL | Status: DC

## 2011-04-14 MED ORDER — FLUTICASONE PROPIONATE 50 MCG/ACT NA SUSP: 2.0000 | Freq: Every day | NASAL | Status: DC

## 2011-04-14 NOTE — Patient Instructions (Signed)

## 2011-04-14 NOTE — Progress Notes (Signed)
  Subjective:    Patient ID: Donna Dyer, female    DOB: Sep 06, 1960, 51 y.o.   MRN: [id number]  Sinusitis This is a new problem. The current episode started in the past 7 days (started last Friday). The problem has been gradually worsening since onset. The maximum temperature recorded prior to her arrival was 100 - 100.9 F. The fever has been present for less than 1 day. She is experiencing no pain. Associated symptoms include chills, congestion, coughing, ear pain, headaches, a hoarse voice, sinus pressure, sneezing and a sore throat. Pertinent negatives include no diaphoresis, neck pain, shortness of breath or swollen glands. Past treatments include spray decongestants (clohrocetin). The treatment provided no relief.      Review of Systems  Constitutional: Positive for chills. Negative for diaphoresis.  HENT: Positive for ear pain, congestion, sore throat, hoarse voice, sneezing and sinus pressure. Negative for neck pain.   Respiratory: Positive for cough. Negative for shortness of breath.   Neurological: Positive for headaches.      BP 141/81  Pulse 78  Temp(Src) 98.8 F (37.1 C) (Oral)  Ht 5\' 6"  (1.676 m)  Wt 210 lb (95.255 kg)  BMI 33.89 kg/m2  SpO2 96% Objective:   Physical Exam  Nursing note and vitals reviewed. Constitutional: She is oriented to person, place, and time. She appears well-developed and well-nourished. No distress.  HENT:  Head: Normocephalic and atraumatic.  Right Ear: Tympanic membrane normal.  Left Ear: Tympanic membrane normal.  Nose: Right sinus exhibits maxillary sinus tenderness. Left sinus exhibits maxillary sinus tenderness.  Mouth/Throat: Oropharynx is clear and moist. No oropharyngeal exudate.  Eyes: Pupils are equal, round, and reactive to light. Right eye exhibits no discharge. Left eye exhibits no discharge. No scleral icterus.  Neck: Neck supple.  Cardiovascular: Normal rate, regular rhythm and normal heart sounds.   Pulmonary/Chest: No  respiratory distress. She has no wheezes. She has rhonchi. She has no rales.  Lymphadenopathy:    She has cervical adenopathy.  Neurological: She is alert and oriented to person, place, and time.  Skin: Skin is warm and dry.  Psychiatric: She has a normal mood and affect.          Assessment & Plan:  Sinusitis  Ceftin 500 mg , Flonase

## 2011-04-15 ENCOUNTER — Encounter: Payer: Self-pay | Admitting: *Deleted

## 2011-05-02 ENCOUNTER — Ambulatory Visit (INDEPENDENT_AMBULATORY_CARE_PROVIDER_SITE_OTHER): Payer: Commercial Managed Care - PPO | Admitting: Family Medicine

## 2011-05-02 ENCOUNTER — Encounter: Payer: Self-pay | Admitting: Family Medicine

## 2011-05-02 ENCOUNTER — Other Ambulatory Visit: Payer: Self-pay | Admitting: Family Medicine

## 2011-05-02 VITALS — BP 150/82 | HR 82 | Ht 66.0 in | Wt 215.0 lb

## 2011-05-02 DIAGNOSIS — J309 Allergic rhinitis, unspecified: Secondary | ICD-10-CM

## 2011-05-02 DIAGNOSIS — G4733 Obstructive sleep apnea (adult) (pediatric): Secondary | ICD-10-CM

## 2011-05-02 DIAGNOSIS — I10 Essential (primary) hypertension: Secondary | ICD-10-CM

## 2011-05-02 NOTE — Progress Notes (Signed)
  Subjective:    Patient ID: Donna Dyer, female    DOB: February 20, 1960, 51 y.o.   MRN: [id number]  HPI  HTN- BPs have been around 130 at home. Tx with sinusitis about 2 weeks ago.  Says has some salt in her diet. Says has been eating a lot of peanut butter. She has had stressful day. NO caffeine. She has been doing the exercise classes.  No CP or SOB.  At some crackers before she came in today. No dizziness.  Has been trying to lose weight  She also wants to  further discuss her sleep study. She had done at [facility]. We are referral for her to see the neurologist to read it to further discuss her mild sleep apnea.  Review of Systems     Objective:   Physical Exam  Constitutional: She is oriented to person, place, and time. She appears well-developed and well-nourished.  HENT:  Head: Normocephalic and atraumatic.  Right Ear: External ear normal.  Left Ear: External ear normal.  Nose: Nose normal.  Mouth/Throat: Oropharynx is clear and moist.       TMs and canals are clear. Left eye with slcer injected. No drainage or discharge. Says around dust after working on curtains.   Eyes: Conjunctivae and EOM are normal. Pupils are equal, round, and reactive to light.  Neck: Neck supple. No thyromegaly present.  Cardiovascular: Normal rate, regular rhythm and normal heart sounds.   Pulmonary/Chest: Effort normal and breath sounds normal. She has no wheezes.  Lymphadenopathy:    She has no cervical adenopathy.  Neurological: She is alert and oriented to person, place, and time.  Skin: Skin is warm and dry.  Psychiatric: She has a normal mood and affect.          Assessment & Plan:  HTN - Asymptomatic.  BP not at goal. We discussed her returning with home BP cuff since home BPs have been well controlled. She will f/u either later this week or next eweek when she comes downstairs for her exercise class.   AR - REstart allergy meds.  She usually takes Careers adviser. Call if this by  itself is not controlling her sxs.   Mild sleep apnea-I think she would potentially benefit from CPAP at least initially to help her with her energy level and helping with weight loss. With CPAP she could eventually lose a significant amount of weight she will likely no longer need it. She has an appointment scheduled with Dr. Caren Hazy in [facility].

## 2011-05-02 NOTE — Patient Instructions (Signed)
Follow up in 1-2 weeks for BP check. Bring in home cuff.

## 2011-05-16 ENCOUNTER — Ambulatory Visit (INDEPENDENT_AMBULATORY_CARE_PROVIDER_SITE_OTHER): Payer: Commercial Managed Care - PPO | Admitting: Family Medicine

## 2011-05-16 VITALS — BP 156/85 | HR 72

## 2011-05-16 DIAGNOSIS — I10 Essential (primary) hypertension: Secondary | ICD-10-CM

## 2011-05-16 NOTE — Progress Notes (Signed)
  Subjective:    Patient ID: Donna Dyer, female    DOB: 1960-03-06, 51 y.o.   MRN: [id number] BP check here and compare home monitors. Wrist monitor read was 163/104 P=72 Cuff on left arm read was 162/85 P=71. If before 5 call [phone] if in the am call [phone] ext. [phone] HPI    Review of Systems     Objective:   Physical Exam        Assessment & Plan:  Hypertension-see if she is still mostly getting systolic blood pressures around 130 home. If she is then we can continue her current regimen and I'll make no changes. I want to verify her home cuff is fairly accurate. If her blood pressure is running higher than 135 lately then please let me know I will adjust her regimen. C.Metheney, MD.

## 2011-05-17 ENCOUNTER — Telehealth: Payer: Self-pay | Admitting: *Deleted

## 2011-05-17 MED ORDER — CHLORTHALIDONE 25 MG PO TABS: 25.0000 mg | ORAL_TABLET | Freq: Every day | ORAL | Status: DC

## 2011-05-17 NOTE — Telephone Encounter (Signed)
Pt notiifed of MD instructions. KJ LPN

## 2011-05-17 NOTE — Progress Notes (Addendum)
  Subjective:    Patient ID: Donna Dyer, female    DOB: 08-05-1960, 51 y.o.   MRN: [id number]  HPI    Review of Systems     Objective:   Physical Exam        Assessment & Plan:  Pt states here lately BP systolic is greater than 135. Please advise.  05/17/2011 Will add chlothalidone to regimen. Recheck BP in 1 months.  Let me know if any problems with the medication.  Cipriano Bunker, MD.

## 2011-05-17 NOTE — Progress Notes (Signed)
Addended by: [name] D on: 05/17/2011 07:48 AM   Modules accepted: Orders, Level of Service

## 2011-05-25 ENCOUNTER — Telehealth: Payer: Self-pay | Admitting: *Deleted

## 2011-05-25 MED ORDER — VERAPAMIL HCL ER 180 MG PO TBCR: 180.0000 mg | EXTENDED_RELEASE_TABLET | Freq: Every day | ORAL | Status: DC

## 2011-05-25 NOTE — Telephone Encounter (Signed)
Stop chlorthalidone. I send new rx for inc dose of verapamil.  Keep f/u app to recheck BP.

## 2011-05-25 NOTE — Telephone Encounter (Signed)
Pt states that the Clothalidone she was started on has caused her to have muscle spasms. States she started the med the following day and then stopped b/c she had the spasms and was going out of town. States she restarted it when she came back and it gave her muscle spasms again. Please advise.

## 2011-05-26 NOTE — Telephone Encounter (Signed)
Left message on vm with reccomendations 

## 2011-06-07 ENCOUNTER — Telehealth: Payer: Self-pay | Admitting: Family Medicine

## 2011-06-07 NOTE — Telephone Encounter (Signed)
Received 5 pages from [employer]. Faxed to Dr. Linford Arnold at [facility]. sd 06/07/11

## 2011-07-04 ENCOUNTER — Other Ambulatory Visit: Payer: Self-pay | Admitting: Family Medicine

## 2011-07-13 ENCOUNTER — Ambulatory Visit (INDEPENDENT_AMBULATORY_CARE_PROVIDER_SITE_OTHER): Payer: Commercial Managed Care - PPO | Admitting: Family Medicine

## 2011-07-13 ENCOUNTER — Encounter: Payer: Self-pay | Admitting: Family Medicine

## 2011-07-13 VITALS — BP 145/82 | HR 84 | Wt 217.0 lb

## 2011-07-13 DIAGNOSIS — E119 Type 2 diabetes mellitus without complications: Secondary | ICD-10-CM

## 2011-07-13 DIAGNOSIS — G4733 Obstructive sleep apnea (adult) (pediatric): Secondary | ICD-10-CM

## 2011-07-13 DIAGNOSIS — I10 Essential (primary) hypertension: Secondary | ICD-10-CM

## 2011-07-13 MED ORDER — VERAPAMIL HCL ER 180 MG PO TBCR: 180.0000 mg | EXTENDED_RELEASE_TABLET | Freq: Every day | ORAL | Status: DC

## 2011-07-13 MED ORDER — NEBIVOLOL HCL 20 MG PO TABS: 1.0000 | ORAL_TABLET | Freq: Every day | ORAL | Status: DC

## 2011-07-13 MED ORDER — LOSARTAN POTASSIUM 100 MG PO TABS: 100.0000 mg | ORAL_TABLET | Freq: Every day | ORAL | Status: DC

## 2011-07-13 NOTE — Patient Instructions (Addendum)
Bring in new machine next time.

## 2011-07-13 NOTE — Progress Notes (Signed)
  Subjective:    Patient ID: Donna Dyer, female    DOB: 1960-02-07, 51 y.o.   MRN: [id number]  HPI HTN - Has been following home BPs with arm cuff.  Recently bought a new one.  Home BP1010-145/63-75.  At Dr. Sim Boast office BP was 148/88.  Brought in home cuff to compare today. No CP or SOB.    OSA-Started on CPAP for sleep apnea.  Has been doing well with it except last night. Woke u pand found she had take n it off.    DM- sugars are doing well. No cuts or sore on feet that aren't healing. No low events.  No CP or SOB.     Review of Systems     Objective:   Physical Exam  Constitutional: She is oriented to person, place, and time. She appears well-developed and well-nourished.  HENT:  Head: Normocephalic and atraumatic.  Cardiovascular: Normal rate, regular rhythm and normal heart sounds.   Pulmonary/Chest: Effort normal and breath sounds normal.  Neurological: She is alert and oriented to person, place, and time.  Skin: Skin is warm and dry.  Psychiatric: She has a normal mood and affect. Her behavior is normal.          Assessment & Plan:  HTN- 2nd reading much better. Home readings look fantastic. Wonder if may have some white coat syndrome. Continue regular exercise. She says she's been going to exercise classes in her building.  Followup in 2 months. She is concerned about weight gain on verapamil. We will keep an eye on this. She does gain weight between now and her followup office visit we may consider discontinuing it. Consider adding a different calcium channel blocker or possibly clonidine patch.   DM- Doing well.  In fact she is doing so well with an A1c under 6.5 off of any medications and I'm going to downgrade her to impaired fasting glucose. We will continue to keep an eye on this and I would like to recheck an A1c in 6 months. Continue to eat a healthy diet and continue to work on regular exercise.  OSA - doing well the CPAP. Hopefully this will help her  blood pressure as well as her weight.

## 2011-08-23 ENCOUNTER — Ambulatory Visit: Payer: Commercial Managed Care - PPO | Attending: Gynecologic Oncology | Admitting: Gynecologic Oncology

## 2011-08-23 ENCOUNTER — Encounter: Payer: Self-pay | Admitting: Gynecologic Oncology

## 2011-08-23 VITALS — BP 158/80 | HR 64 | Temp 98.0°F | Resp 16 | Ht 67.21 in | Wt 217.7 lb

## 2011-08-23 DIAGNOSIS — D071 Carcinoma in situ of vulva: Secondary | ICD-10-CM

## 2011-08-23 DIAGNOSIS — D072 Carcinoma in situ of vagina: Secondary | ICD-10-CM

## 2011-08-23 DIAGNOSIS — I10 Essential (primary) hypertension: Secondary | ICD-10-CM | POA: Insufficient documentation

## 2011-08-23 DIAGNOSIS — Z7982 Long term (current) use of aspirin: Secondary | ICD-10-CM | POA: Insufficient documentation

## 2011-08-23 DIAGNOSIS — E119 Type 2 diabetes mellitus without complications: Secondary | ICD-10-CM | POA: Insufficient documentation

## 2011-08-23 DIAGNOSIS — E785 Hyperlipidemia, unspecified: Secondary | ICD-10-CM | POA: Insufficient documentation

## 2011-08-23 DIAGNOSIS — Z79899 Other long term (current) drug therapy: Secondary | ICD-10-CM | POA: Insufficient documentation

## 2011-08-23 NOTE — Patient Instructions (Signed)
Follow up for CO2 laser of the vagina Thank you very much Ms. Donna Dyer for allowing me to provide care for you today.  I appreciate your confidence in choosing our Gynecologic Oncology team.  If you have any questions about your visit today please call our office and we will get back to you as soon as possible.  [name]. Alfonsa Vaile MD., PhD Gynecologic Oncology

## 2011-08-23 NOTE — Progress Notes (Signed)
Consult Note: Gyn-Onc  Consult was requested by Dr. Henderson Cloud for the evaluation of Donna Dyer 51 y.o. female  CC:  Chief Complaint  Patient presents with  . VAIN III    New consult    HPI:  G3P2 LNMP 1995 s/p TAH RSO  for endometriosis.  H/o abnormal pap test for 2 years since 06/2009.  Pap 12/2010 LGSIL, colposcopy  73/2013 c/w VAIN III.  No vaginal bleeding, occasional dysparunia. I  Current Meds:  Outpatient Encounter Prescriptions as of 08/23/2011  Medication Sig Dispense Refill  . aspirin 81 MG tablet Take 81 mg by mouth. When remembers      . CALCIUM CARBONATE PO Take 1 tablet by mouth. Chewable tablet      . Cholecalciferol (VITAMIN D) 2000 UNITS CAPS Take 2,000 Units by mouth daily.       . Chromium Picolinate 500 MCG TABS Take 1 tablet (500 mcg total) by mouth daily.    0  . estradiol (ESTRACE) 0.1 MG/GM vaginal cream Place 2 g vaginally 2 (two) times a week. Use on Sunday and Wednesday      . fexofenadine (ALLEGRA) 180 MG tablet Take 180 mg by mouth daily.      Marland Kitchen losartan (COZAAR) 100 MG tablet Take 1 tablet (100 mg total) by mouth daily.  30 tablet  1  . Multiple Vitamin (MULTIVITAMIN) tablet Take 1 tablet by mouth daily.        . Nebivolol HCl (BYSTOLIC) 20 MG TABS Take 1 tablet (20 mg total) by mouth daily.  90 tablet  1  . verapamil (CALAN-SR) 180 MG CR tablet Take 1 tablet (180 mg total) by mouth daily.  30 tablet  1  . calcium carbonate (TUMS - DOSED IN MG ELEMENTAL CALCIUM) 500 MG chewable tablet Chew 1 tablet by mouth daily as needed. For indigestion      . fluticasone (FLONASE) 50 MCG/ACT nasal spray Place 2 sprays into the nose daily.  16 g  2  . Pseudoephedrine-Guaifenesin [phone] MG TB12 Take 1 tablet by mouth 2 (two) times daily.  30 each  0    Allergy:  Allergies  Allergen Reactions  . Amlodipine Besylate     REACTION: muscle cramps  . Chlorthalidone Other (See Comments)    Muscle cramps  . Codeine Nausea And Vomiting  . Hctz (Hydrochlorothiazide)       Muscle spasms    . Metformin And Related Diarrhea  . Pineapple Itching    Tongue swells  . Azithromycin Itching and Rash  . Sulfonamide Derivatives Rash    Social Hx:   History   Social History  . Marital Status: Married    Spouse Name: N/A    Number of Children: 2  . Years of Education: N/A   Occupational History  . 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 Smoking status: Never Smoker   . Smokeless tobacco: Never Used  . Alcohol Use: No     Doesn't drink   . Drug Use: No  . Sexually Active: Yes    Birth Control/ Protection: Surgical   Other Topics Concern  . Not on file   Social History Narrative   Lives in [place] with husband and daughter. Ambulatory, no cane or walker. Works at [employer].     Past Surgical Hx:  Past Surgical History  Procedure Date  . Esophagogastroduodenoscopy 7-11    for anemia  . Total abdominal hysterectomy 1995  bilat oophorectomy/ endometriosis  . Flexible sigmoidoscopy 01/31/2011    Procedure: FLEXIBLE SIGMOIDOSCOPY;  Surgeon: Barrie Folk, MD;  Location: [facility] ENDOSCOPY;  Service: Endoscopy;  Laterality: N/A;  LSO 1997.  Past Medical Hx:  Past Medical History  Diagnosis Date  . Hyperlipidemia   . Hypertension   . Anemia   . Endometriosis   . Pain, joint, hip, right     chronic- Dr Charlann Boxer Dr Ethelene Hal  . Edema     LLE- podiatry in [place]  . Diabetes mellitus   . Goiter   . Hemorrhoids   Colonoscopy for rectal bleeding 2010.  Dx hemorrhoids. Mammogram 04/2011 wnl  Past Gynecological History: G2 P2 Menarche 11, irregular menses until hysterectomy in 1995.  IUD for 7-8 years.  Abn pap since 2011. NSVD x 2   Family Hx:  Family History  Problem Relation Age of Onset  . Hyperlipidemia Mother   . Cancer Father     bladder  . Heart attack Father     MI at age 32  . Diabetes Sister   . Diabetes Brother   . Diabetes Brother   . Diabetes Brother   . Diabetes Brother   .  Sarcoidosis Brother   . Sarcoidosis Sister   . Anesthesia problems Neg Hx   . Hypotension Neg Hx   . Malignant hyperthermia Neg Hx   . Pseudochol deficiency Neg Hx     Review of Systems:  Constitutional  Feels normal Cardiovascular  No chest pain, shortness of breath, bilateral lower extremity edema, mild. Pulmonary  No cough or wheeze.  Gastro Intestinal  No nausea, vomitting, or diarrhoea. Intermittent bright red blood per rectum.  No abdominal pain, change in bowel movement, or constipation.  Genito Urinary  No frequency, urgency, dysuria, no vaginal bleeding Musculo Skeletal  No myalgia, R hip pain presumed secondary to slipped disk.    Neurologic  No weakness, numbness, change in gait Psychology  No depression, anxiety, insomnia.   Vitals:  Blood pressure 158/80, pulse 64, temperature 98 F (36.7 C), temperature source Oral, resp. rate 16, height 5' 7.21" (1.707 m), weight 217 lb 11.4 oz (98.753 kg).  Physical Exam: WD in NAD Neck  Supple NROM, without any enlargements.  Lymph Node Survey No cervical supraclavicular or inguinal adenopathy Cardiovascular  Pulse normal rate, regularity and rhythm.  Lungs  Clear to auscultation bilateraly, without wheezes/crackles/rhonchi.  Psychiatry  Alert and oriented to person, place, and time  Abdomen  Normoactive bowel sounds, abdomen soft, non-tender and obese.  Back No CVA tenderness Genito Urinary  Vulva/vagina: Normal external female genitalia.  No lesions. No discharge or bleeding.   Vagina: Atrophic.  On colposcopic examination an acetowhite lesion was appreciated at the left vaginal apex.  No other lesions identified.  No vaginal nodularity or masses in the vaginal vault.     Uterus: Small, mobile, no parametrial involvement or nodularity. Extremities  No bilateral cyanosis, clubbing or edema.   Assessment/Plan:  Donna Dyer  is a 51 y.o.  year old with VAIN III.  A discrete lesion is noted at the left  vaginal apex.  Options discussed were  CO2 laser, upper vaginectomy of vaginal 5FU.  CO2 laser recommended.  Patient is aware that this may not be a self limiting process and that close surveillance will be necessary.     Laurette Schimke, MD, PhD 08/23/2011, 12:08 PM

## 2011-08-25 ENCOUNTER — Encounter: Payer: Self-pay | Admitting: Family Medicine

## 2011-08-25 ENCOUNTER — Ambulatory Visit (INDEPENDENT_AMBULATORY_CARE_PROVIDER_SITE_OTHER): Payer: Commercial Managed Care - PPO | Admitting: Family Medicine

## 2011-08-25 VITALS — BP 150/91 | HR 81 | Ht 66.0 in | Wt 219.0 lb

## 2011-08-25 DIAGNOSIS — I10 Essential (primary) hypertension: Secondary | ICD-10-CM

## 2011-08-25 DIAGNOSIS — R635 Abnormal weight gain: Secondary | ICD-10-CM

## 2011-08-25 MED ORDER — CLONIDINE HCL 0.1 MG/24HR TD PTWK: 1.0000 | MEDICATED_PATCH | TRANSDERMAL | Status: DC

## 2011-08-25 NOTE — Progress Notes (Signed)
  Subjective:    Patient ID: Donna Dyer, female    DOB: 18-Oct-1960, 51 y.o.   MRN: [id number]  HPI  HTN- Not checking Bps at home. No CP or SOB. Taking meds regularly.  Doesn tolerate diuretics.  Hasn't been able to work out regularly but still going at least 3 days per week. HAs been wearing her CPAP consistanlt for at least 2 months.    Review of Systems     Objective:   Physical Exam  Constitutional: She is oriented to person, place, and time. She appears well-developed and well-nourished.  HENT:  Head: Normocephalic and atraumatic.  Cardiovascular: Normal rate, regular rhythm and normal heart sounds.   Pulmonary/Chest: Effort normal and breath sounds normal.  Neurological: She is alert and oriented to person, place, and time.  Skin: Skin is warm and dry.  Psychiatric: She has a normal mood and affect. Her behavior is normal.          Assessment & Plan:  HTN- Uncontrolled. Add clonidone patch. Try to check BP at work. F/U in one months. Work on low sat diet and exercise.  Verapamil could be contributing to weight gain.

## 2011-08-26 LAB — T4, FREE: Free T4: 1.18 ng/dL (ref 0.80–1.80)

## 2011-09-05 ENCOUNTER — Encounter (HOSPITAL_BASED_OUTPATIENT_CLINIC_OR_DEPARTMENT_OTHER): Payer: Self-pay | Admitting: *Deleted

## 2011-09-05 NOTE — Progress Notes (Signed)
To [facility] at 0700- Orders pending from Dr Onalee Hua unless additional order received,Ekg,Cxr in chart.Npo after MN-Spoke with Dr Acey Lav regarding BP meds and problem with taking on empty stomach-will take calan- sr ,bystolic in late evening  and cozaar in afternoon instead of lunch time the day prior to surgery.Keep catapres patch on.

## 2011-09-07 ENCOUNTER — Encounter (HOSPITAL_BASED_OUTPATIENT_CLINIC_OR_DEPARTMENT_OTHER): Payer: Self-pay | Admitting: Anesthesiology

## 2011-09-07 ENCOUNTER — Encounter (HOSPITAL_BASED_OUTPATIENT_CLINIC_OR_DEPARTMENT_OTHER)
Admission: RE | Disposition: A | Discharge status: Home / Self Care | Payer: Self-pay | Source: Ambulatory Visit | Attending: Gynecology

## 2011-09-07 ENCOUNTER — Encounter (HOSPITAL_BASED_OUTPATIENT_CLINIC_OR_DEPARTMENT_OTHER): Payer: Self-pay

## 2011-09-07 ENCOUNTER — Ambulatory Visit (HOSPITAL_BASED_OUTPATIENT_CLINIC_OR_DEPARTMENT_OTHER): Payer: Commercial Managed Care - PPO | Admitting: Anesthesiology

## 2011-09-07 ENCOUNTER — Ambulatory Visit (HOSPITAL_BASED_OUTPATIENT_CLINIC_OR_DEPARTMENT_OTHER)
Admission: RE | Admit: 2011-09-07 | Discharge: 2011-09-07 | Disposition: A | Discharge status: Home / Self Care | Payer: Commercial Managed Care - PPO | Source: Ambulatory Visit | Attending: Gynecology | Admitting: Gynecology

## 2011-09-07 DIAGNOSIS — Z7982 Long term (current) use of aspirin: Secondary | ICD-10-CM | POA: Insufficient documentation

## 2011-09-07 DIAGNOSIS — Z8052 Family history of malignant neoplasm of bladder: Secondary | ICD-10-CM | POA: Insufficient documentation

## 2011-09-07 DIAGNOSIS — I10 Essential (primary) hypertension: Secondary | ICD-10-CM | POA: Insufficient documentation

## 2011-09-07 DIAGNOSIS — Z79899 Other long term (current) drug therapy: Secondary | ICD-10-CM | POA: Insufficient documentation

## 2011-09-07 DIAGNOSIS — E785 Hyperlipidemia, unspecified: Secondary | ICD-10-CM | POA: Insufficient documentation

## 2011-09-07 DIAGNOSIS — J385 Laryngeal spasm: Secondary | ICD-10-CM | POA: Insufficient documentation

## 2011-09-07 DIAGNOSIS — D072 Carcinoma in situ of vagina: Secondary | ICD-10-CM | POA: Insufficient documentation

## 2011-09-07 DIAGNOSIS — E119 Type 2 diabetes mellitus without complications: Secondary | ICD-10-CM | POA: Insufficient documentation

## 2011-09-07 HISTORY — PX: VULVAR LESION REMOVAL: SHX5391

## 2011-09-07 LAB — POCT I-STAT 4, (NA,K, GLUC, HGB,HCT)
Glucose, Bld: 108 mg/dL — ABNORMAL HIGH (ref 70–99)
HCT: 39 % (ref 36.0–46.0)
Potassium: 3.7 mEq/L (ref 3.5–5.1)

## 2011-09-07 SURGERY — VULVAR LESION
Anesthesia: General | Site: Vagina | Wound class: Clean Contaminated

## 2011-09-07 MED ORDER — IODINE STRONG (LUGOLS) 5 % PO SOLN
ORAL | Status: DC | PRN
  Administered 2011-09-07: 0.2 mL

## 2011-09-07 MED ORDER — MEPERIDINE HCL 25 MG/ML IJ SOLN: 6.2500 mg | INTRAMUSCULAR | Status: DC | PRN

## 2011-09-07 MED ORDER — SUCCINYLCHOLINE CHLORIDE 20 MG/ML IJ SOLN
INTRAMUSCULAR | Status: DC | PRN
  Administered 2011-09-07: 50 mg via INTRAVENOUS

## 2011-09-07 MED ORDER — ONDANSETRON HCL 4 MG/2ML IJ SOLN
INTRAMUSCULAR | Status: DC | PRN
  Administered 2011-09-07: 4 mg via INTRAVENOUS

## 2011-09-07 MED ORDER — LACTATED RINGERS IV SOLN
INTRAVENOUS | Status: DC
  Administered 2011-09-07 (×3): via INTRAVENOUS

## 2011-09-07 MED ORDER — PROPOFOL 10 MG/ML IV EMUL
INTRAVENOUS | Status: DC | PRN
  Administered 2011-09-07: 200 mg via INTRAVENOUS

## 2011-09-07 MED ORDER — ACETIC ACID 5 % SOLN
Status: DC | PRN
  Administered 2011-09-07: 1 via TOPICAL

## 2011-09-07 MED ORDER — LACTATED RINGERS IV SOLN: INTRAVENOUS | Status: DC

## 2011-09-07 MED ORDER — FENTANYL CITRATE 0.05 MG/ML IJ SOLN
INTRAMUSCULAR | Status: DC | PRN
  Administered 2011-09-07: 50 ug via INTRAVENOUS

## 2011-09-07 MED ORDER — OXYCODONE HCL 5 MG PO TABS: 5.0000 mg | ORAL_TABLET | Freq: Once | ORAL | Status: DC | PRN

## 2011-09-07 MED ORDER — ACETAMINOPHEN 10 MG/ML IV SOLN: 1000.0000 mg | Freq: Once | INTRAVENOUS | Status: DC | PRN

## 2011-09-07 MED ORDER — HYDROMORPHONE HCL PF 1 MG/ML IJ SOLN: 0.2500 mg | INTRAMUSCULAR | Status: DC | PRN

## 2011-09-07 MED ORDER — PROMETHAZINE HCL 25 MG/ML IJ SOLN: 6.2500 mg | INTRAMUSCULAR | Status: DC | PRN

## 2011-09-07 MED ORDER — ALBUTEROL SULFATE HFA 108 (90 BASE) MCG/ACT IN AERS
INHALATION_SPRAY | RESPIRATORY_TRACT | Status: DC | PRN
  Administered 2011-09-07 (×7): 2 via RESPIRATORY_TRACT

## 2011-09-07 MED ORDER — OXYCODONE HCL 5 MG/5ML PO SOLN: 5.0000 mg | Freq: Once | ORAL | Status: DC | PRN

## 2011-09-07 MED ORDER — MIDAZOLAM HCL 5 MG/5ML IJ SOLN
INTRAMUSCULAR | Status: DC | PRN
  Administered 2011-09-07: 0.5 mg via INTRAVENOUS

## 2011-09-07 MED ORDER — LIDOCAINE HCL (CARDIAC) 20 MG/ML IV SOLN
INTRAVENOUS | Status: DC | PRN
  Administered 2011-09-07: 75 mg via INTRAVENOUS

## 2011-09-07 SURGICAL SUPPLY — 37 items
APPLICATOR COTTON TIP 6IN STRL (MISCELLANEOUS) ×4 IMPLANT
BLADE SURG 15 STRL LF DISP TIS (BLADE) ×1 IMPLANT
BLADE SURG 15 STRL SS (BLADE) ×2
CANISTER SUCTION 1200CC (MISCELLANEOUS) IMPLANT
CANISTER SUCTION 2500CC (MISCELLANEOUS) ×1 IMPLANT
CATH ROBINSON RED A/P 16FR (CATHETERS) IMPLANT
CLOTH BEACON ORANGE TIMEOUT ST (SAFETY) ×2 IMPLANT
COVER TABLE BACK 60X90 (DRAPES) ×2 IMPLANT
DRAPE LG THREE QUARTER DISP (DRAPES) ×2 IMPLANT
DRAPE UNDERBUTTOCKS STRL (DRAPE) ×2 IMPLANT
DRESSING TELFA 8X3 (GAUZE/BANDAGES/DRESSINGS) ×2 IMPLANT
ELECT REM PT RETURN 9FT ADLT (ELECTROSURGICAL)
ELECTRODE REM PT RTRN 9FT ADLT (ELECTROSURGICAL) IMPLANT
GLOVE BIO SURGEON STRL SZ7.5 (GLOVE) ×4 IMPLANT
GOWN STRL NON-REIN LRG LVL3 (GOWN DISPOSABLE) ×1 IMPLANT
GOWN STRL REIN XL XLG (GOWN DISPOSABLE) ×3 IMPLANT
GOWN W/COTTON TOWEL STD LRG (GOWNS) ×1 IMPLANT
GOWN XL W/COTTON TOWEL STD (GOWNS) ×1 IMPLANT
LEGGING LITHOTOMY PAIR STRL (DRAPES) ×2 IMPLANT
MARCAINE 30ML IMPLANT
NDL HYPO 25X1 1.5 SAFETY (NEEDLE) ×1 IMPLANT
NEEDLE HYPO 25X1 1.5 SAFETY (NEEDLE) ×2 IMPLANT
NS IRRIG 500ML POUR BTL (IV SOLUTION) IMPLANT
PACK BASIN DAY SURGERY FS (CUSTOM PROCEDURE TRAY) ×2 IMPLANT
PAD OB MATERNITY 4.3X12.25 (PERSONAL CARE ITEMS) ×2 IMPLANT
PAD PREP 24X48 CUFFED NSTRL (MISCELLANEOUS) ×2 IMPLANT
PENCIL BUTTON HOLSTER BLD 10FT (ELECTRODE) IMPLANT
SCOPETTES 8  STERILE (MISCELLANEOUS) ×3
SCOPETTES 8 STERILE (MISCELLANEOUS) ×2 IMPLANT
SYR CONTROL 10ML LL (SYRINGE) ×2 IMPLANT
TOWEL OR 17X24 6PK STRL BLUE (TOWEL DISPOSABLE) ×4 IMPLANT
TRAY DSU PREP LF (CUSTOM PROCEDURE TRAY) ×2 IMPLANT
TUBE CONNECTING 12X1/4 (SUCTIONS) ×2 IMPLANT
VACUUM HOSE 7/8X10 W/ WAND (MISCELLANEOUS) ×1 IMPLANT
VACUUM HOSE/TUBING 7/8INX6FT (MISCELLANEOUS) ×1 IMPLANT
WATER STERILE IRR 500ML POUR (IV SOLUTION) ×3 IMPLANT
YANKAUER SUCT BULB TIP NO VENT (SUCTIONS) ×2 IMPLANT

## 2011-09-07 NOTE — Anesthesia Procedure Notes (Addendum)
Procedure Name: LMA Insertion Date/Time: 09/07/2011 8:45 AM Performed by: Fran Lowes Pre-anesthesia Checklist: Patient identified, Emergency Drugs available, Suction available and Patient being monitored Patient Re-evaluated:Patient Re-evaluated prior to inductionOxygen Delivery Method: Circle System Utilized Preoxygenation: Pre-oxygenation with 100% oxygen Intubation Type: IV induction Ventilation: Mask ventilation without difficulty LMA: LMA with gastric port inserted LMA Size: 4.0 Number of attempts: 1 Placement Confirmation: positive ETCO2 Tube secured with: Tape Dental Injury: Teeth and Oropharynx as per pre-operative assessment    Procedure Name: Intubation Date/Time: 09/07/2011 8:58 AM Performed by: Fran Lowes Pre-anesthesia Checklist: Patient identified, Emergency Drugs available, Suction available and Patient being monitored Patient Re-evaluated:Patient Re-evaluated prior to inductionOxygen Delivery Method: Circle System Utilized Preoxygenation: Pre-oxygenation with 100% oxygen Intubation Type: IV induction Ventilation: Two handed mask ventilation required Laryngoscope Size: Mac and 4 Grade View: Grade I Tube type: Oral Number of attempts: 1 Airway Equipment and Method: stylet and oral airway Placement Confirmation: ETT inserted through vocal cords under direct vision,  positive ETCO2 and breath sounds checked- equal and bilateral Tube secured with: Tape Dental Injury: Teeth and Oropharynx as per pre-operative assessment  Comments: Patient intubated due to laryngospasm.

## 2011-09-07 NOTE — Op Note (Signed)
Donna Dyer  female MEDICAL RECORD ZO:[id number] DATE OF BIRTH: July 28, 1960 PHYSICIAN: De Blanch, M.D  DATE OF PROCEDURE: August    OPERATIVE REPORT  PREOPERATIVE DIAGNOSIS: Vaginal intraepithelial neoplasia; grade 3  POSTOPERATIVE DIAGNOSIS: Same  PROCEDURE: CO2 laser vaporization of the upper vagina  SURGEON: De Blanch, M.D ASSISTANT: None ANESTHESIA: Gen. oral tracheal tube ESTIMATED BLOOD LOSS: Minimal  SURGICAL FINDINGS: Evaluation of the upper vagina with the colposcope showed a small lesion in the center portion of the vagina. Using Lugol's solution this was a nonstaining area.  At the induction of anesthesia the patient developed laryngospasm requiring intubation.  PROCEDURE: The patient was brought to the operating room and after satisfactory attainment of anesthesia was placed in lithotomy position in [place] stirrups. She was draped with wet towels. A speculum was placed in the vagina and acetic acid was applied. I had difficulty identifying the lesion so therefore use Lugol solution which showed a nonstaining area at the apex of the vagina. This measured approximately 1 cm in diameter. Using the CO2 laser at 10 W of power we ablated to a depth of 3 mm. A 5 mm margin was achieved in all areas around the obvious lesion. The patient tolerated procedure well. She was awakened from anesthesia and taken to the recovery room in satisfactory condition. Sponge needle instrument counts correct x2   De Blanch, M.D     Donna Dyer  female MEDICAL RECORD [id number] DATE OF BIRTH: 11-Jul-1960 PHYSICIAN: De Blanch, M.D  DATE OF PROCEDURE:  DATE OF DISCHARGE:  OPERATIVE REPORT  PREOPERATIVE DIAGNOSIS:   POSTOPERATIVE DIAGNOSIS:  PROCEDURE:   SURGEON: De Blanch, M.D ASSISTANT: ANESTHESIA:  ESTIMATED BLOOD LOSS:  SURGICAL FINDINGS:  PROCEDURE:   De Blanch, M.D

## 2011-09-07 NOTE — Interval H&P Note (Signed)
History and Physical Interval Note:  09/07/2011 8:36 AM  Donna Dyer  has presented today for surgery, with the diagnosis of VAGINAL LESION  The various methods of treatment have been discussed with the patient and family. After consideration of risks, benefits and other options for treatment, the patient has consented to  Procedure(s) (LRB): VAGINAL LESION (N/A) CO2 LASER APPLICATION (N/A) as a surgical intervention .  The patient's history has been reviewed, patient examined, no change in status, stable for surgery.  I have reviewed the patient's chart and labs.  Questions were answered to the patient's satisfaction.     CLARKE-PEARSON,Jannet Calip L

## 2011-09-07 NOTE — Anesthesia Postprocedure Evaluation (Signed)
Anesthesia Post Note  Patient: Donna Dyer  Procedure(s) Performed: Procedure(s) (LRB): VULVAR LESION (N/A) CO2 LASER APPLICATION (N/A)  Anesthesia type: General  Patient location: PACU  Post pain: Pain level controlled  Post assessment: Post-op Vital signs reviewed  Last Vitals: BP 132/72  Pulse 70  Temp 35.6 C (Oral)  Resp 11  Ht 5\' 6"  (1.676 m)  Wt 220 lb 5 oz (99.933 kg)  BMI 35.56 kg/m2  SpO2 97%  Post vital signs: Reviewed  Level of consciousness: sedated, but no obvious deficits. AAOx4. FCCx4.    Complications: I discussed her intraoperative laryngospasm and bronchospasm. She stated that she feels normal. No apparent anesthesia complications.

## 2011-09-07 NOTE — Anesthesia Preprocedure Evaluation (Addendum)
Anesthesia Evaluation  Patient identified by MRN, date of birth, ID band Patient awake    Reviewed: Allergy & Precautions, H&P , NPO status , Patient's Chart, lab work & pertinent test results  Airway Mallampati: I TM Distance: >3 FB Neck ROM: Full    Dental  (+) Dental Advisory Given and Teeth Intact   Pulmonary sleep apnea and Continuous Positive Airway Pressure Ventilation ,  breath sounds clear to auscultation  Pulmonary exam normal       Cardiovascular hypertension, Pt. on medications and Pt. on home beta blockers Rhythm:Regular Rate:Normal     Neuro/Psych negative neurological ROS  negative psych ROS   GI/Hepatic negative GI ROS, Neg liver ROS,   Endo/Other  Well Controlled  Renal/GU negative Renal ROS     Musculoskeletal negative musculoskeletal ROS (+)   Abdominal (+) + obese,  Abdomen: soft.    Peds  Hematology  (+) Blood dyscrasia, anemia ,   Anesthesia Other Findings   Reproductive/Obstetrics                          Anesthesia Physical Anesthesia Plan  ASA: II  Anesthesia Plan: General   Post-op Pain Management:    Induction: Intravenous  Airway Management Planned: LMA  Additional Equipment:   Intra-op Plan:   Post-operative Plan: Extubation in OR  Informed Consent: I have reviewed the patients History and Physical, chart, labs and discussed the procedure including the risks, benefits and alternatives for the proposed anesthesia with the patient or authorized representative who has indicated his/her understanding and acceptance.   Dental advisory given  Plan Discussed with: CRNA and Surgeon  Anesthesia Plan Comments:        Anesthesia Quick Evaluation

## 2011-09-07 NOTE — H&P (View-Only) (Signed)
Consult Note: Gyn-Onc  Consult was requested by Dr. Tomblin for the evaluation of Donna Dyer 51 y.o. female  CC:  Chief Complaint  Patient presents with  . VAIN III    New consult    HPI:  G3P2 LNMP 1995 s/p TAH RSO  for endometriosis.  H/o abnormal pap test for 2 years since 06/2009.  Pap 12/2010 LGSIL, colposcopy  73/2013 c/w VAIN III.  No vaginal bleeding, occasional dysparunia. I  Current Meds:  Outpatient Encounter Prescriptions as of 08/23/2011  Medication Sig Dispense Refill  . aspirin 81 MG tablet Take 81 mg by mouth. When remembers      . CALCIUM CARBONATE PO Take 1 tablet by mouth. Chewable tablet      . Cholecalciferol (VITAMIN D) 2000 UNITS CAPS Take 2,000 Units by mouth daily.       . Chromium Picolinate 500 MCG TABS Take 1 tablet (500 mcg total) by mouth daily.    0  . estradiol (ESTRACE) 0.1 MG/GM vaginal cream Place 2 g vaginally 2 (two) times a week. Use on Sunday and Wednesday      . fexofenadine (ALLEGRA) 180 MG tablet Take 180 mg by mouth daily.      . losartan (COZAAR) 100 MG tablet Take 1 tablet (100 mg total) by mouth daily.  30 tablet  1  . Multiple Vitamin (MULTIVITAMIN) tablet Take 1 tablet by mouth daily.        . Nebivolol HCl (BYSTOLIC) 20 MG TABS Take 1 tablet (20 mg total) by mouth daily.  90 tablet  1  . verapamil (CALAN-SR) 180 MG CR tablet Take 1 tablet (180 mg total) by mouth daily.  30 tablet  1  . calcium carbonate (TUMS - DOSED IN MG ELEMENTAL CALCIUM) 500 MG chewable tablet Chew 1 tablet by mouth daily as needed. For indigestion      . fluticasone (FLONASE) 50 MCG/ACT nasal spray Place 2 sprays into the nose daily.  16 g  2  . Pseudoephedrine-Guaifenesin 120-1200 MG TB12 Take 1 tablet by mouth 2 (two) times daily.  30 each  0    Allergy:  Allergies  Allergen Reactions  . Amlodipine Besylate     REACTION: muscle cramps  . Chlorthalidone Other (See Comments)    Muscle cramps  . Codeine Nausea And Vomiting  . Hctz (Hydrochlorothiazide)       Muscle spasms    . Metformin And Related Diarrhea  . Pineapple Itching    Tongue swells  . Azithromycin Itching and Rash  . Sulfonamide Derivatives Rash    Social Hx:   History   Social History  . Marital Status: Married    Spouse Name: N/A    Number of Children: 2  . Years of Education: N/A   Occupational History  . BILLING OFFICE [employer]   Social History Main Topics  . Smoking status: Never Smoker   . Smokeless tobacco: Never Used  . Alcohol Use: No     Doesn't drink   . Drug Use: No  . Sexually Active: Yes    Birth Control/ Protection: Surgical   Other Topics Concern  . Not on file   Social History Narrative   Lives in [place] with husband and daughter. Ambulatory, no cane or walker. Works at [employer].     Past Surgical Hx:  Past Surgical History  Procedure Date  . Esophagogastroduodenoscopy 7-11    for anemia  . Total abdominal hysterectomy 1995      bilat oophorectomy/ endometriosis  . Flexible sigmoidoscopy 01/31/2011    Procedure: FLEXIBLE SIGMOIDOSCOPY;  Surgeon: John C Hayes, MD;  Location: [facility] ENDOSCOPY;  Service: Endoscopy;  Laterality: N/A;  LSO 1997.  Past Medical Hx:  Past Medical History  Diagnosis Date  . Hyperlipidemia   . Hypertension   . Anemia   . Endometriosis   . Pain, joint, hip, right     chronic- Dr Olin/ Dr Ramos  . Edema     LLE- podiatry in [place]  . Diabetes mellitus   . Goiter   . Hemorrhoids   Colonoscopy for rectal bleeding 2010.  Dx hemorrhoids. Mammogram 04/2011 wnl  Past Gynecological History: G2 P2 Menarche 11, irregular menses until hysterectomy in 1995.  IUD for 7-8 years.  Abn pap since 2011. NSVD x 2   Family Hx:  Family History  Problem Relation Age of Onset  . Hyperlipidemia Mother   . Cancer Father     bladder  . Heart attack Father     MI at age 70  . Diabetes Sister   . Diabetes Brother   . Diabetes Brother   . Diabetes Brother   . Diabetes Brother   .  Sarcoidosis Brother   . Sarcoidosis Sister   . Anesthesia problems Neg Hx   . Hypotension Neg Hx   . Malignant hyperthermia Neg Hx   . Pseudochol deficiency Neg Hx     Review of Systems:  Constitutional  Feels normal Cardiovascular  No chest pain, shortness of breath, bilateral lower extremity edema, mild. Pulmonary  No cough or wheeze.  Gastro Intestinal  No nausea, vomitting, or diarrhoea. Intermittent bright red blood per rectum.  No abdominal pain, change in bowel movement, or constipation.  Genito Urinary  No frequency, urgency, dysuria, no vaginal bleeding Musculo Skeletal  No myalgia, R hip pain presumed secondary to slipped disk.    Neurologic  No weakness, numbness, change in gait Psychology  No depression, anxiety, insomnia.   Vitals:  Blood pressure 158/80, pulse 64, temperature 98 F (36.7 C), temperature source Oral, resp. rate 16, height 5' 7.21" (1.707 m), weight 217 lb 11.4 oz (98.753 kg).  Physical Exam: WD in NAD Neck  Supple NROM, without any enlargements.  Lymph Node Survey No cervical supraclavicular or inguinal adenopathy Cardiovascular  Pulse normal rate, regularity and rhythm.  Lungs  Clear to auscultation bilateraly, without wheezes/crackles/rhonchi.  Psychiatry  Alert and oriented to person, place, and time  Abdomen  Normoactive bowel sounds, abdomen soft, non-tender and obese.  Back No CVA tenderness Genito Urinary  Vulva/vagina: Normal external female genitalia.  No lesions. No discharge or bleeding.   Vagina: Atrophic.  On colposcopic examination an acetowhite lesion was appreciated at the left vaginal apex.  No other lesions identified.  No vaginal nodularity or masses in the vaginal vault.     Uterus: Small, mobile, no parametrial involvement or nodularity. Extremities  No bilateral cyanosis, clubbing or edema.   Assessment/Plan:  Ms. Donna Dyer  is a 51 y.o.  year old with VAIN III.  A discrete lesion is noted at the left  vaginal apex.  Options discussed were  CO2 laser, upper vaginectomy of vaginal 5FU.  CO2 laser recommended.  Patient is aware that this may not be a self limiting process and that close surveillance will be necessary.     Beatrice Sehgal, MD, PhD 08/23/2011, 12:08 PM   

## 2011-09-07 NOTE — Transfer of Care (Signed)
Immediate Anesthesia Transfer of Care Note  Patient: Donna Dyer  Procedure(s) Performed: Procedure(s) (LRB): VULVAR LESION (N/A) CO2 LASER APPLICATION (N/A)  Patient Location: Patient transported to PACU with oxygen via face mask at 6 Liters / Min  Anesthesia Type: General  Level of Consciousness: awake and alert   Airway & Oxygen Therapy: Patient Spontanous Breathing and Patient connected to face mask oxygen  Post-op Assessment: Report given to PACU RN and Post -op Vital signs reviewed and stable  Post vital signs: Reviewed and stable  Dentition: Teeth and oropharynx remain in pre-op condition, small cut on upper lip  Complications: No apparent anesthesia complications

## 2011-09-09 ENCOUNTER — Encounter (HOSPITAL_BASED_OUTPATIENT_CLINIC_OR_DEPARTMENT_OTHER): Payer: Self-pay | Admitting: Gynecology

## 2011-09-16 ENCOUNTER — Telehealth: Payer: Self-pay | Admitting: Gynecologic Oncology

## 2011-09-16 NOTE — Telephone Encounter (Signed)
Pt scheduled for a follow up visit with Dr. Nelly Rout on Sept 26.  No complaints voiced.  Patient describes expected post operative status.  Adequate PO intake reported.  Bowels and bladder functioning without difficulty.  Pain minimal.  Instructed to call for any questions or concerns.

## 2011-09-26 ENCOUNTER — Encounter: Payer: Self-pay | Admitting: Family Medicine

## 2011-09-26 ENCOUNTER — Ambulatory Visit (INDEPENDENT_AMBULATORY_CARE_PROVIDER_SITE_OTHER): Payer: Commercial Managed Care - PPO | Admitting: Family Medicine

## 2011-09-26 VITALS — BP 139/78 | HR 75 | Wt 222.0 lb

## 2011-09-26 DIAGNOSIS — R7301 Impaired fasting glucose: Secondary | ICD-10-CM

## 2011-09-26 DIAGNOSIS — E781 Pure hyperglyceridemia: Secondary | ICD-10-CM

## 2011-09-26 DIAGNOSIS — I10 Essential (primary) hypertension: Secondary | ICD-10-CM

## 2011-09-26 MED ORDER — AMBULATORY NON FORMULARY MEDICATION: Status: DC

## 2011-09-26 NOTE — Patient Instructions (Signed)
If they don't cover the glucose machine and strips and we will just have you not test for now. Then we can repeat her A1c in January. Though we could do it sooner if you would like. Continue work on diet and exercise. Can  Try the zetonna one squirt in each nostril daily and see if this helps with the nighttime congestion. Continue the Allegra or consider switching to the Zyrtec if needed. Sent to the lab anytime for fasting lab work. We will recheck her cholesterol as well as check a BMP.

## 2011-09-26 NOTE — Progress Notes (Signed)
  Subjective:    Patient ID: Donna Dyer, female    DOB: 30-Sep-1960, 51 y.o.   MRN: [id number]  HPI  HTN - 172 /90 at work. Then on 9/11 BP was 150/96. On 9/12 BP  130/86.  Doing well on the clonidine patch. No SE. No CP or SOB.  Blood pressures are finally under control. She did recently have a hysterectomy.   Impaired fasting glucose-she still checking her sugars most days. They have been well controlled. Images she has gained her weight back over the last year that she worked so hard to get off.  Hyperlipidemia-she will copy of her cholesterol that she had done through work. It was significantly elevated from last year's levels.   Review of Systems     Objective:   Physical Exam  Constitutional: She is oriented to person, place, and time. She appears well-developed and well-nourished.  HENT:  Head: Normocephalic and atraumatic.  Cardiovascular: Normal rate, regular rhythm and normal heart sounds.   Pulmonary/Chest: Effort normal and breath sounds normal.  Neurological: She is alert and oriented to person, place, and time.  Skin: Skin is warm and dry.  Psychiatric: She has a normal mood and affect. Her behavior is normal.          Assessment & Plan:  Hyperlipidemia - Recheck lpids. Off the chromium.  TC 231, LDL 104, HDL 55, TG 361. We will see if her lab work looks a little bit better. Continue work on exercise and low-fat diet. Recommend she go in the next couple weeks.  HTN - WEll controlled.  Check BMP and lipids.  F/U in 4 months.  In the clonidine patch is working very well for her. Call if needs refills  Impaired fasting glucose-at this point time not sure that she needs to continue testing her glucose. I will try read a prescription for glucometer and lancets and strips and see if her insurance will cover. Than most likely will not since she no longer has the diagnosis of diabetes. We will recheck her A1c in January when she follows up.  Declined flu vaccine today.  She says she will get it at work.

## 2011-09-27 ENCOUNTER — Other Ambulatory Visit: Payer: Self-pay | Admitting: Family Medicine

## 2011-10-06 ENCOUNTER — Ambulatory Visit: Payer: Commercial Managed Care - PPO | Attending: Gynecologic Oncology | Admitting: Gynecologic Oncology

## 2011-10-06 ENCOUNTER — Telehealth: Payer: Self-pay | Admitting: *Deleted

## 2011-10-06 ENCOUNTER — Encounter: Payer: Self-pay | Admitting: Gynecologic Oncology

## 2011-10-06 VITALS — BP 150/86 | HR 84 | Temp 98.7°F | Resp 16 | Ht 67.21 in | Wt 222.5 lb

## 2011-10-06 DIAGNOSIS — D072 Carcinoma in situ of vagina: Secondary | ICD-10-CM

## 2011-10-06 DIAGNOSIS — E785 Hyperlipidemia, unspecified: Secondary | ICD-10-CM | POA: Insufficient documentation

## 2011-10-06 DIAGNOSIS — Z90710 Acquired absence of both cervix and uterus: Secondary | ICD-10-CM | POA: Insufficient documentation

## 2011-10-06 DIAGNOSIS — K625 Hemorrhage of anus and rectum: Secondary | ICD-10-CM

## 2011-10-06 DIAGNOSIS — I10 Essential (primary) hypertension: Secondary | ICD-10-CM | POA: Insufficient documentation

## 2011-10-06 DIAGNOSIS — Z79899 Other long term (current) drug therapy: Secondary | ICD-10-CM | POA: Insufficient documentation

## 2011-10-06 DIAGNOSIS — E119 Type 2 diabetes mellitus without complications: Secondary | ICD-10-CM | POA: Insufficient documentation

## 2011-10-06 NOTE — Progress Notes (Signed)
Follow Up Note: Gyn-Onc  Donna Dyer 51 y.o. female  CC:  Chief Complaint  Patient presents with  . VAIN III    Follow up   HPI:  Donna Dyer is a 51 year old female referred to GYN Oncology by Dr. 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  Gravida 3 Para 2.  History includes a TAH, RSO in 1995 for endometriosis.  She has had abnormal pap tests for 2 years since 06/2009.  Pap test in 12/2010 and 07/2011 revealed LGSIL, with the pap in 07/2011 resulting HPV/ mild dysplasia/VAIN 1.  Colposcopy was performed on 08/03/11 with a vaginal biopsy resulting VAIN III.  On 09/07/11, she underwent CO2 laser vaporization of the upper vagina by Dr. Stanford Breed.    Interval History:  Donna Dyer presents today for follow up after CO2 laser vaporization of the upper vagina on September 07, 2011.  For the past two weeks, she has experienced bright red rectal bleeding.  Denies diarrhea or constipation.  Reporting episode of rectal bleeding in Jan. 2013 that required hospitalization.  At that time, she reports that she had a flex sig. per Dr. Madilyn Fireman with findings pointing to hemorrhoids.  Reporting the use of Anusol intermittently.  Has not followed up since Jan. 2013.  Denies symptoms of anemia.  Denies nausea, vomiting, epigastric pain before or after eating, change in appetite, change in consistency of bowel movements, reflux, gastric or duodenal ulcers, vaginal bleeding, or urethral bleeding.  Reporting right hip and groin pain intermittently.  Unsure if it is related to increased exercise.  Stating that she was participating in [place] and she felt that she injured her right hip so she now participates in belly dancing classes.       Review of Systems  Constitutional:  Feels normal  Cardiovascular:  No chest pain, shortness of breath, mild bilateral lower extremity edema. Pulmonary:  No cough or wheeze.  Gastrointestinal:  No nausea, vomiting, or diarrhea. Intermittent bright red blood per rectum.  No abdominal pain, change in bowel movement, or constipation.  Genitourinary:  No frequency, urgency, dysuria, no vaginal bleeding.  Musculoskeletal:  No myalgia, R hip pain presumed secondary to increased exercise.  Neurologic:  No weakness, numbness, or change in gait.  Psychology:  No depression, anxiety, or insomnia.   Current Meds:  Outpatient Encounter Prescriptions as of 10/06/2011  Medication Sig Dispense Refill  . AMBULATORY NON FORMULARY MEDICATION Medication Name: Glucometer, strips, and lancets. Check once a day. Dx: Impaired fasting glucose.  1 Units  0  . calcium carbonate (TUMS - DOSED IN MG ELEMENTAL CALCIUM) 500 MG chewable tablet Chew 1 tablet by mouth daily as needed. For indigestion      . CALCIUM CARBONATE PO Take 1 tablet by mouth. Chewable tablet      . Cholecalciferol (VITAMIN D) 2000 UNITS CAPS Take 2,000 Units by mouth daily.       . cloNIDine (CATAPRES - DOSED IN MG/24 HR) 0.1 mg/24hr patch Place 1 patch (0.1 mg total) onto the skin once a week.  4 patch  5  . estradiol (ESTRACE) 0.1 MG/GM vaginal cream Place 2 g vaginally 2 (two) times a week. Use on Sunday and Wednesday      . fexofenadine (ALLEGRA) 180 MG tablet Take 180 mg by mouth daily.      Marland Kitchen losartan (COZAAR) 100 MG tablet Take 1 tablet (100 mg total) by mouth daily.  30 tablet  1  . Multiple Vitamin (MULTIVITAMIN) tablet Take 1  tablet by mouth daily.        . Nebivolol HCl (BYSTOLIC) 20 MG TABS Take 1 tablet (20 mg total) by mouth daily.  90 tablet  1  . verapamil (CALAN-SR) 180 MG CR tablet TAKE 1 TABLET (180 MG TOTAL) BY MOUTH DAILY.  30 tablet  1  . verapamil (CALAN-SR) 180 MG CR tablet Take 1 tablet (180 mg total) by mouth daily.  30 tablet  1   Allergy:  Allergies  Allergen Reactions  . Amlodipine Besylate     REACTION: muscle cramps  . Chlorthalidone Other (See Comments)    Muscle cramps  . Codeine Nausea And Vomiting  . Hctz (Hydrochlorothiazide)     Muscle spasms    . Metformin And Related  Diarrhea  . Pineapple Itching    Tongue swells  . Azithromycin Itching and Rash  . Sulfonamide Derivatives Rash   Social Hx:   History   Social History  . Marital Status: Married    Spouse Name: N/A    Number of Children: 2  . 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 Smoking status: Never Smoker   . Smokeless tobacco: Never Used  . Alcohol Use: No     Doesn't drink   . Drug Use: No  . Sexually Active: Yes    Birth Control/ Protection: Surgical   Other Topics Concern  . Not on file   Social History Narrative   Lives in [place] with husband and daughter. Ambulatory, no cane or walker. Works at [employer].    Past Surgical Hx:  Past Surgical History  Procedure Date  . Esophagogastroduodenoscopy 7-11    for anemia  . Total abdominal hysterectomy 1995    bilat oophorectomy/ endometriosis  . Flexible sigmoidoscopy 01/31/2011    Procedure: FLEXIBLE SIGMOIDOSCOPY;  Surgeon: Barrie Folk, MD;  Location[facility] ENDOSCOPY;  Service: Endoscopy;  Laterality: N/A;  . Vulvar lesion removal 09/07/2011    Procedure: VULVAR LESION;  Surgeon: Jeannette Corpus, MD;  Location: [facility];  Service: Gynecology;  Laterality: N/A;  vaginal lesion   Past Medical Hx:  Past Medical History  Diagnosis Date  . Hyperlipidemia   . Hypertension   . Anemia   . Endometriosis   . Pain, joint, hip, right     chronic- Dr Charlann Boxer Dr Ethelene Hal  . Edema     LLE- podiatry in [place]  . Diabetes mellitus   . Goiter   . Hemorrhoids    Family Hx:  Family History  Problem Relation Age of Onset  . Hyperlipidemia Mother   . Cancer Father     bladder  . Heart attack Father     MI at age 55  . Diabetes Sister   . Diabetes Brother   . Diabetes Brother   . Diabetes Brother   . Diabetes Brother   . Sarcoidosis Brother   . Sarcoidosis Sister   . Anesthesia problems Neg Hx   . Hypotension Neg  Hx   . Malignant hyperthermia Neg Hx   . Pseudochol deficiency Neg Hx    Vitals:  Blood pressure 150/86, pulse 84, temperature 98.7 F (37.1 C), resp. rate 16, height 5' 7.21" (1.707 m), weight 222 lb 8 oz (100.925 kg).  Physical Exam:  General:  Well developed, well nourished female in no acute distress.  Alert and oriented x 3.   Neck:  Supple  NROM, without any enlargements.  Lymph Node Survey:  No cervical supraclavicular or inguinal adenopathy.  Cardiovascular:  Heart rate and rhythm regular.  Lungs:  Clear to auscultation bilaterally.  No wheezes/crackles/rhonchi noted.  Abdomen:  Normoactive bowel sounds, abdomen soft, non-tender and obese.  Back:  No CVA tenderness  Genitourinary:    Vulva/vagina: Normal external female genitalia. No lesions. No discharge or bleeding.    Vagina: Atrophic. Area of lasering erythematous at the left vaginal apex. Silver nitrate applied to the area.  No bleeding or discharge noted.  No lesions noted. No vaginal nodularity or masses in the vaginal vault.     Assessment/Plan: Donna Dyer is a 51 year old female with a history of VAIN III.  She underwent CO2 laser vaporization of the upper vagina on September 07, 2011.  She is to follow up with GYN Oncology in six months.  A pap test will be performed at that time.  Instructed to follow up with Dr. Madilyn Fireman or a gastroenterologist of her choice to evaluate continued rectal bleeding as soon as possible.  Instructed to call for any questions or concerns.  Printice Hellmer DEAL, NP 10/06/2011, 3:11 PM   Dr. Laurette Schimke present during the examination and concurs with the above.  Donna Dyer seen and examined with Warner Mccreedy NP.  I have reviewed and agree with the above.  [place], [facility]

## 2011-10-06 NOTE — Telephone Encounter (Signed)
Pt states she seen her GYN Oncology doctor and mentiond about some rectal bleeding she is having. States provider told her to call here to get a referral to a GI doctor. Pt states that she doesn't want to see the previous GI doctor we sent her to. She would like to go elsewhere. Please advise.

## 2011-10-06 NOTE — Patient Instructions (Signed)
Follow up with GYN Oncology in six months.  A pap test will be performed at that time.  Please follow up with Dr. Madilyn Fireman or a gastroenterologist of your choice to evaluate continued rectal bleeding as soon as possible.    Thank you for coming to see [place] today.  We appreciate your confidence in choosing [facility] Gynecologic Oncology for your medical care.  If you have any questions about your visit today, please call our office and we will get back to you as soon as possible.  Dr. Laurette Schimke and Warner Mccreedy, NP Gynecologic Oncology

## 2011-10-06 NOTE — Telephone Encounter (Signed)
That is fine. I can put in a referral for her. She has a preference where she would like to go? [facility]?  I'm not actually sure who she saw previously. I tried to look under the scanned notes I didn't see anything in the system. There might be something in the old chart.

## 2011-10-07 ENCOUNTER — Ambulatory Visit (INDEPENDENT_AMBULATORY_CARE_PROVIDER_SITE_OTHER): Payer: Commercial Managed Care - PPO | Admitting: Family Medicine

## 2011-10-07 ENCOUNTER — Encounter: Payer: Self-pay | Admitting: Family Medicine

## 2011-10-07 VITALS — BP 139/76 | HR 75 | Temp 98.0°F | Ht 67.0 in | Wt 221.0 lb

## 2011-10-07 DIAGNOSIS — K625 Hemorrhage of anus and rectum: Secondary | ICD-10-CM

## 2011-10-07 DIAGNOSIS — K649 Unspecified hemorrhoids: Secondary | ICD-10-CM

## 2011-10-07 MED ORDER — HYDROCORTISONE ACETATE 25 MG RE SUPP: 25.0000 mg | Freq: Two times a day (BID) | RECTAL | Status: DC

## 2011-10-07 NOTE — Progress Notes (Signed)
  Subjective:    Patient ID: Donna Dyer, female    DOB: [birth year], 51 y.o.   MRN: [id number]  HPI She has had some rectal bleeding for about 4 weeks since had her VAIN procedure.  She is now passing clots rectally.  Saw Dr. Nelly Rout yesterday who thought it might be internal hemorrhoid.  When happened before, last Jan, would bleed when she would have gas and ended up at the hospital admitted for GI bleed, and doesn't want this  To happen again.  No pain with BMS. Says she  Is having loose stools. She is not constipated.  No worsening or alleviating symptoms.  FLEXIBLE SIGMOIDOSCOPY Date: 01/31/2011 Procedure: FLEXIBLE SIGMOIDOSCOPY; Surgeon: Barrie Folk, MD; Location: [facility] ENDOSCOPY; Service: Endoscopy; Laterality: N/A; She also had an EGD the year prior.  Review of Systems     Objective:   Physical Exam  Constitutional: She appears well-developed and well-nourished.  HENT:  Head: Normocephalic and atraumatic.  Genitourinary:       2 large external hemorrhoids. They're not currently inflamed or irritated. Internal rectal exam revealed a inflamed hemorrhoid as well as a small fissure.  Skin: Skin is warm and dry.  Psychiatric: She has a normal mood and affect. Her behavior is normal.          Assessment & Plan:  Rectal bleeding - Exam revealed internal hemorrhoid and a small tear that looks like it has fresh blood on it. Will check hemoglobin today. Will refer to colorectal surgery.  Will refill her anusol which she used back in January to try to calm down the inflammation.  Actually think she would benefit from seeing a colorectal surgeon for further treatment options instead of GI. She has had a fairly normal colonoscopy back in January as well as an upper endoscopy. Hemoglobin was okay today.

## 2011-10-07 NOTE — Telephone Encounter (Signed)
Referral placed.

## 2011-10-07 NOTE — Telephone Encounter (Signed)
Pt is ok with seeing someone at [facility]. States the previous was at [facility] and states they didn't seem to concerned. Pt is concerned that it also could be hemmrhoids so I gave her an appt today to see you.

## 2011-10-11 ENCOUNTER — Telehealth: Payer: Self-pay | Admitting: Family Medicine

## 2011-10-11 DIAGNOSIS — K649 Unspecified hemorrhoids: Secondary | ICD-10-CM

## 2011-10-11 NOTE — Telephone Encounter (Signed)
I see. Sorry. New referral entered.

## 2011-10-11 NOTE — Telephone Encounter (Signed)
Pt needs to see a colorectal surgeon but the ref that looks like was entered was for GI.  She wanted to know which one you want her to see.  Also, do you have someone to suggest?  thanks

## 2011-10-25 ENCOUNTER — Encounter: Payer: Self-pay | Admitting: Family Medicine

## 2011-10-25 ENCOUNTER — Ambulatory Visit (INDEPENDENT_AMBULATORY_CARE_PROVIDER_SITE_OTHER): Payer: Commercial Managed Care - PPO | Admitting: Family Medicine

## 2011-10-25 VITALS — BP 177/92 | HR 66 | Temp 98.0°F | Wt 222.0 lb

## 2011-10-25 DIAGNOSIS — J329 Chronic sinusitis, unspecified: Secondary | ICD-10-CM

## 2011-10-25 MED ORDER — CICLESONIDE 50 MCG/ACT NA SUSP: 2.0000 | Freq: Every day | NASAL | Status: DC

## 2011-10-25 MED ORDER — DOXYCYCLINE HYCLATE 100 MG PO TABS: ORAL_TABLET | ORAL | Status: AC

## 2011-10-25 NOTE — Progress Notes (Signed)
CC: Donna Dyer is a 51 y.o. female is here for Sinusitis   Subjective: HPI:  2 days of an acute onset frontal headache, pressure behind eyes, pressure in the nose, upper molar teeth pain associated with clear nasal discharge and fatigue. Interventions have included offbrand DayQuil and NyQuil but only mildly helping her symptoms. Symptoms are present 24 hours a day see worse at night. Symptoms are not influenced by exertion or position of the head. She denies motor sensory disturbances, vision loss, hearing loss, eye discharge, cough, shortness of breath, wheezing, orthopnea, nor fevers or chills.   Review Of Systems Outlined In HPI  Past Medical History  Diagnosis Date  . Hyperlipidemia   . Hypertension   . Anemia   . Endometriosis   . Pain, joint, hip, right     chronic- Dr Charlann Boxer Dr Ethelene Hal  . Edema     LLE- podiatry in [place]  . Diabetes mellitus   . Goiter   . Hemorrhoids      Family History  Problem Relation Age of Onset  . Hyperlipidemia Mother   . Cancer Father     bladder  . Heart attack Father     MI at age 4  . Diabetes Sister   . Diabetes Brother   . Diabetes Brother   . Diabetes Brother   . Diabetes Brother   . Sarcoidosis Brother   . Sarcoidosis Sister   . Anesthesia problems Neg Hx   . Hypotension Neg Hx   . Malignant hyperthermia Neg Hx   . Pseudochol deficiency Neg Hx      History  Substance Use Topics  . Smoking status: Never Smoker   . Smokeless tobacco: Never Used  . Alcohol Use: No     Doesn't drink      Objective: Filed Vitals:   10/25/11 1350  BP: 177/92  Pulse: 66  Temp: 98 F (36.7 C)    General: Alert and Oriented, No Acute Distress HEENT: Pupils equal, round, reactive to light. Conjunctivae clear.  External ears unremarkable, canals clear with intact TMs with appropriate landmarks.  Middle ear appears open without effusion. Erythematous swollen inferior turbinates.  Moist mucous membranes, pharynx without inflammation nor  lesions.  Frontal and maxillary sinus discomfort with percussion. Neck supple without palpablelymphadenopathy nor abnormal masses. Lungs: Clear to auscultation bilaterally, no wheezing/ronchi/rales.  Comfortable work of breathing. Good air movement. Cardiac: Regular rate and rhythm. Normal S1/S2.  No murmurs, rubs, nor gallops.     Assessment & Plan: Donna Dyer was seen today for sinusitis.  Diagnoses and associated orders for this visit:  Sinusitis - doxycycline (VIBRA-TABS) 100 MG tablet; One by mouth twice a day for ten days. - ciclesonide (OMNARIS) 50 MCG/ACT nasal spray; Place 2 sprays into both nostrils daily.    Discussed with patient that odds are she most likely has a viral sinusitis and that unless her symptoms progress beyond 7-10 days, she develops fevers, or she has a rebound of symptoms after improvement my advice would be to hold on antibiotics unless the above conditions are met.  She has GI intolerance to Augmentin therefore doxycycline provided the patient. Went over symptomatically therapy including nasal saline washes and Tylenol. Encouraged her to avoid decongestants as she took one this morning which may be a reflected in her blood pressure today. I've asked her to return in 1-2 weeks when she starts feeling better for a blood pressure check Return in about 2 weeks (around 11/08/2011).

## 2011-10-28 ENCOUNTER — Encounter (INDEPENDENT_AMBULATORY_CARE_PROVIDER_SITE_OTHER): Payer: Self-pay | Admitting: General Surgery

## 2011-10-28 ENCOUNTER — Ambulatory Visit (INDEPENDENT_AMBULATORY_CARE_PROVIDER_SITE_OTHER): Payer: Commercial Managed Care - PPO | Admitting: General Surgery

## 2011-10-28 VITALS — BP 148/90 | HR 66 | Temp 98.3°F | Resp 16 | Ht 66.0 in | Wt 222.4 lb

## 2011-10-28 DIAGNOSIS — K648 Other hemorrhoids: Secondary | ICD-10-CM

## 2011-10-28 MED ORDER — DOCUSATE SODIUM 100 MG PO CAPS: 100.0000 mg | ORAL_CAPSULE | Freq: Two times a day (BID) | ORAL | Status: DC

## 2011-10-28 NOTE — Progress Notes (Signed)
Patient ID: Donna Dyer, female   DOB: [birth year], 51 y.o.   MRN: [id number]  No chief complaint on file.   HPI Donna Dyer is a 51 y.o. female.  This patient is referred by Dr. Glade Lloyd for evaluation of bleeding hemorrhoids. She says that she has had this off and on for a while but has really been going on mostly over the last 2 months. Prior to this last May and episode was in January of this year and at that time she was admitted to the hospital for a day for observation and her gastroenterologist Dr. Madilyn Fireman performed a flexible sigmoidoscopy which demonstrated only hemorrhoids. Prior to this she had a colonoscopy in 2000 and which she tells me was normal. She says that over the last 2 months she has had some bright red bleeding from the rectum and even significant enough to pass some clots. She said her last episode was 2 days ago. Is mainly with bowel movements which she says are normal for about once per day. She can have some intermittent constipation and intermittent diarrhea as well. She does not take any fiber supplements or stool softeners. She does not have any significant abdominal pain. She does not have a family history of colon cancer HPI  Past Medical History  Diagnosis Date  . Hyperlipidemia   . Hypertension   . Anemia   . Endometriosis   . Pain, joint, hip, right     chronic- Dr Charlann Boxer Dr Ethelene Hal  . Edema     LLE- podiatry in [place]  . Diabetes mellitus   . Goiter   . Hemorrhoids     Past Surgical History  Procedure Date  . Esophagogastroduodenoscopy 7-11    for anemia  . Total abdominal hysterectomy 1995    bilat oophorectomy/ endometriosis  . Flexible sigmoidoscopy 01/31/2011    Procedure: FLEXIBLE SIGMOIDOSCOPY;  Surgeon: Barrie Folk, MD;  Location: [facility] ENDOSCOPY;  Service: Endoscopy;  Laterality: N/A;  . Vulvar lesion removal 09/07/2011    Procedure: VULVAR LESION;  Surgeon: Jeannette Corpus, MD;  Location: [facility];  Service: Gynecology;   Laterality: N/A;  vaginal lesion    Family History  Problem Relation Age of Onset  . Hyperlipidemia Mother   . Cancer Father     bladder  . Heart attack Father     MI at age 43  . Diabetes Sister   . Diabetes Brother   . Diabetes Brother   . Diabetes Brother   . Diabetes Brother   . Sarcoidosis Brother   . Sarcoidosis Sister   . Anesthesia problems Neg Hx   . Hypotension Neg Hx   . Malignant hyperthermia Neg Hx   . Pseudochol deficiency Neg Hx     Social History History  Substance Use Topics  . Smoking status: Never Smoker   . Smokeless tobacco: Never Used  . Alcohol Use: No     Doesn't drink     Allergies  Allergen Reactions  . Amlodipine Besylate     REACTION: muscle cramps  . Chlorthalidone Other (See Comments)    Muscle cramps  . Codeine Nausea And Vomiting  . Hctz (Hydrochlorothiazide)     Muscle spasms    . Metformin And Related Diarrhea  . Pineapple Itching    Tongue swells  . Azithromycin Itching and Rash  . Sulfonamide Derivatives Rash    Current Outpatient Prescriptions  Medication Sig Dispense Refill  . ACCU-CHEK COMPACT STRIPS test strip       .  ACCU-CHEK SOFTCLIX LANCETS lancets       . AMBULATORY NON FORMULARY MEDICATION Medication Name: Glucometer, strips, and lancets. Check once a day. Dx: Impaired fasting glucose.  1 Units  0  . Blood Glucose Monitoring Suppl (ACCU-CHEK COMPACT CARE KIT) KIT       . calcium carbonate (TUMS - DOSED IN MG ELEMENTAL CALCIUM) 500 MG chewable tablet Chew 1 tablet by mouth daily as needed. For indigestion      . CALCIUM CARBONATE PO Take 1 tablet by mouth. Chewable tablet      . Cholecalciferol (VITAMIN D) 2000 UNITS CAPS Take 2,000 Units by mouth daily.       . ciclesonide (OMNARIS) 50 MCG/ACT nasal spray Place 2 sprays into both nostrils daily.  12.5 g  0  . cloNIDine (CATAPRES - DOSED IN MG/24 HR) 0.1 mg/24hr patch Place 1 patch (0.1 mg total) onto the skin once a week.  4 patch  5  . doxycycline  (VIBRA-TABS) 100 MG tablet One by mouth twice a day for ten days.  20 tablet  0  . fexofenadine (ALLEGRA) 180 MG tablet Take 180 mg by mouth daily.      . fluticasone (FLONASE) 50 MCG/ACT nasal spray       . hydrocortisone (ANUSOL-HC) 25 MG suppository Place 1 suppository (25 mg total) rectally 2 (two) times daily.  24 suppository  0  . losartan (COZAAR) 100 MG tablet Take 1 tablet (100 mg total) by mouth daily.  30 tablet  1  . Multiple Vitamin (MULTIVITAMIN) tablet Take 1 tablet by mouth daily.        . Nebivolol HCl (BYSTOLIC) 20 MG TABS Take 1 tablet (20 mg total) by mouth daily.  90 tablet  1  . verapamil (CALAN-SR) 180 MG CR tablet Take 1 tablet (180 mg total) by mouth daily.  30 tablet  1  . estradiol (ESTRACE) 0.1 MG/GM vaginal cream Place 2 g vaginally 2 (two) times a week. Use on Sunday and Wednesday        Review of Systems Review of Systems All other review of systems negative or noncontributory except as stated in the HPI  Blood pressure 148/90, pulse 66, temperature 98.3 F (36.8 C), temperature source Temporal, resp. rate 16, height 5\' 6"  (1.676 m), weight 222 lb 6.4 oz (100.88 kg).  Physical Exam Physical Exam Physical Exam  Nursing note and vitals reviewed. Constitutional: She is oriented to person, place, and time. She appears well-developed and well-nourished. No distress.  HENT:  Head: Normocephalic and atraumatic.  Mouth/Throat: No oropharyngeal exudate.  Eyes: Conjunctivae and EOM are normal. Pupils are equal, round, and reactive to light. Right eye exhibits no discharge. Left eye exhibits no discharge. No scleral icterus.  Neck: Normal range of motion. Neck supple. No tracheal deviation present.  Cardiovascular: Normal rate, regular rhythm, normal heart sounds and intact distal pulses.   Pulmonary/Chest: Effort normal and breath sounds normal. No stridor. No respiratory distress. She has no wheezes.  Abdominal: Soft. Bowel sounds are normal. She exhibits no  distension and no mass. There is no tenderness. There is no rebound and no guarding.  Rectal: she has some external skin anteriorly. There is no evidence of any anal fissure. I do not see any active bleeding. Anoscopic exam demonstrate some internal hemorrhoids with some evidence of recent bleeding but no other suspicious masses. Musculoskeletal: Normal range of motion. She exhibits no edema and no tenderness.  Neurological: She is alert and oriented to person, place, and  time.  Skin: Skin is warm and dry. No rash noted. She is not diaphoretic. No erythema. No pallor.  Psychiatric: She has a normal mood and affect. Her behavior is normal. Judgment and thought content normal.    Data Reviewed Endo reports  Assessment    Rectal bleeding and hemorrhoids Think that her bleeding is most consistent with hemorrhoidal bleeding. I do not see any evidence of any fissure on exam today although this is certainly a possibility given the fact that she has had some the bleeding in the past. She tells me that she has not had diverticulosis on her endoscopy as far she knows. It is reassuring that she has had a fairly recent colonoscopy which was normal as well as a recent flexible sigmoidoscopy which was also without any evidence of malignancy. I discussed with her the options for conservative management versus surgery and we will try a short trial of 46 weeks of conservative management with increased fiber intake and water intake and stool softeners to see if this improves her symptoms. If this improves her symptoms then we can continue with conservative management otherwise I will see her back and we will discuss surgical options again.    Plan    Increase fiber and water intake Stool softeners Followup with me in 4-6 weeks if bleeding continues.       Lodema Pilot DAVID 10/28/2011, 9:42 AM

## 2011-10-29 LAB — BASIC METABOLIC PANEL WITH GFR
BUN: 12 mg/dL (ref 6–23)
CO2: 27 mEq/L (ref 19–32)
Chloride: 102 mEq/L (ref 96–112)
GFR, Est Non African American: 78 mL/min
Glucose, Bld: 111 mg/dL — ABNORMAL HIGH (ref 70–99)
Potassium: 4.2 mEq/L (ref 3.5–5.3)

## 2011-10-29 LAB — LIPID PANEL: Cholesterol: 252 mg/dL — ABNORMAL HIGH (ref 0–200)

## 2011-11-26 ENCOUNTER — Other Ambulatory Visit: Payer: Self-pay | Admitting: Family Medicine

## 2011-12-14 ENCOUNTER — Other Ambulatory Visit: Payer: Self-pay | Admitting: Family Medicine

## 2011-12-16 ENCOUNTER — Ambulatory Visit (INDEPENDENT_AMBULATORY_CARE_PROVIDER_SITE_OTHER): Payer: Commercial Managed Care - PPO | Admitting: Family Medicine

## 2011-12-16 ENCOUNTER — Encounter: Payer: Self-pay | Admitting: Family Medicine

## 2011-12-16 VITALS — BP 149/83 | HR 64 | Ht 66.0 in | Wt 226.0 lb

## 2011-12-16 DIAGNOSIS — M2550 Pain in unspecified joint: Secondary | ICD-10-CM

## 2011-12-16 DIAGNOSIS — IMO0001 Reserved for inherently not codable concepts without codable children: Secondary | ICD-10-CM

## 2011-12-16 DIAGNOSIS — M545 Low back pain, unspecified: Secondary | ICD-10-CM

## 2011-12-16 DIAGNOSIS — M791 Myalgia, unspecified site: Secondary | ICD-10-CM

## 2011-12-16 MED ORDER — CYCLOBENZAPRINE HCL 10 MG PO TABS: 5.0000 mg | ORAL_TABLET | Freq: Every evening | ORAL | Status: DC | PRN

## 2011-12-16 MED ORDER — MELOXICAM 15 MG PO TABS: 15.0000 mg | ORAL_TABLET | Freq: Every day | ORAL | Status: DC

## 2011-12-16 NOTE — Progress Notes (Signed)
  Subjective:    Patient ID: Donna Dyer, female    DOB: 11-23-1960, 51 y.o.   MRN: [id number]  HPI Bilateral hip, knees, ankles. Says just aches all over including in her shoulders and back. Says started Monday but is some better today.  No recent fever or illsness. Ankes with some swelling. Occ swelling in hands. Says her muscles feel sore. No exercise. Mother, sister, and brother with RA.  She says she tried Excedrin for her back and that worked fairly well. No other worsening or alleviating symptoms.  Note, she has seen Dr. Janey Dyer in the past for her right hip. He did x-rays and felt like most of her symptoms were coming from her low back. She has not seen him since 2011.   Review of Systems     Objective:   Physical Exam  Constitutional: She is oriented to person, place, and time. She appears well-developed and well-nourished.  HENT:  Head: Normocephalic and atraumatic.  Musculoskeletal:       Low back, hips, knee, and ankles with NROM. She has trace ankle edema bilaterally. Both hips are tender over the greater trochanter. The right is worse than the left. Hip, knee, ankle joint is out of 5 bilaterally. Patellar reflexes are 2+ bilterally.  No crepitus in the knees. No increased laxity. Nontender along the joint lines. I see no distinct swelling over the hand joints. She is tender over her right forearm. She's also tender over both upper thighs. No tenderness over the calves  Neurological: She is alert and oriented to person, place, and time.  Skin: Skin is warm and dry.  Psychiatric: She has a normal mood and affect. Her behavior is normal.          Assessment & Plan:  Polyarthralgia-she says her pain got worse when the weather got colder today to follow up at [employer] she actually feels a little bit better. It could just be osteoarthritis. She has a very strong family history or rheumatoid arthritis in her mother, brother, and her sister. Thus I would like to do a evaluation  including an ANA, sedimentation rate, CCP, CBC with differential. At this point in time I did send over to the pharmacy, I explained is an anti-inflammatory and should help with her pain and discomfort.  Myalgias-unclear etiology at this point. We'll check a CK level to rule out other muscle disorders. She is not on a statin or the drug but should be causing myalgias. We will also check a sedimentation rate.  Low back pain and stiffness-work on stretching exercises and try the anti-inflammatory. If getting worse or not improving then please let me know.

## 2011-12-17 LAB — RHEUMATOID FACTOR: Rhuematoid fact SerPl-aCnc: <10 IU/mL (ref <=14)

## 2011-12-17 LAB — CBC WITH DIFFERENTIAL/PLATELET
Basophils Absolute: 0 10*3/uL (ref 0.0–0.1)
HCT: 33.9 % — ABNORMAL LOW (ref 36.0–46.0)
Hemoglobin: 11.4 g/dL — ABNORMAL LOW (ref 12.0–15.0)
Lymphocytes Relative: 30 % (ref 12–46)
Lymphs Abs: 2.4 10*3/uL (ref 0.7–4.0)
Monocytes Absolute: 0.6 10*3/uL (ref 0.1–1.0)
Neutro Abs: 4.8 10*3/uL (ref 1.7–7.7)
RBC: 4.13 MIL/uL (ref 3.87–5.11)
RDW: 14.6 % (ref 11.5–15.5)
WBC: 7.9 10*3/uL (ref 4.0–10.5)

## 2011-12-17 LAB — SEDIMENTATION RATE: Sed Rate: 36 mm/hr — ABNORMAL HIGH (ref 0–22)

## 2011-12-19 LAB — ANA: Anti Nuclear Antibody(ANA): NEGATIVE

## 2012-01-05 ENCOUNTER — Other Ambulatory Visit: Payer: Self-pay | Admitting: Family Medicine

## 2012-01-26 ENCOUNTER — Ambulatory Visit (INDEPENDENT_AMBULATORY_CARE_PROVIDER_SITE_OTHER): Payer: Commercial Managed Care - PPO | Admitting: Family Medicine

## 2012-01-26 ENCOUNTER — Encounter: Payer: Self-pay | Admitting: Family Medicine

## 2012-01-26 ENCOUNTER — Ambulatory Visit (INDEPENDENT_AMBULATORY_CARE_PROVIDER_SITE_OTHER): Payer: Commercial Managed Care - PPO

## 2012-01-26 VITALS — BP 132/86 | HR 73 | Resp 18 | Wt 226.0 lb

## 2012-01-26 DIAGNOSIS — M545 Low back pain, unspecified: Secondary | ICD-10-CM

## 2012-01-26 DIAGNOSIS — R748 Abnormal levels of other serum enzymes: Secondary | ICD-10-CM

## 2012-01-26 DIAGNOSIS — M25559 Pain in unspecified hip: Secondary | ICD-10-CM

## 2012-01-26 DIAGNOSIS — D509 Iron deficiency anemia, unspecified: Secondary | ICD-10-CM

## 2012-01-26 DIAGNOSIS — M25551 Pain in right hip: Secondary | ICD-10-CM

## 2012-01-26 DIAGNOSIS — I10 Essential (primary) hypertension: Secondary | ICD-10-CM

## 2012-01-26 NOTE — Progress Notes (Addendum)
Subjective:    Patient ID: Donna Dyer, female    DOB: 04/01/1960, 52 y.o.   MRN: [id number]  HPI Has a lot of low back and hip pain.  X 3 year.  Last xrya of right hip was in 2011. Worse on right leg. Radiates into right buttock and into upper thigh. Painful to sit on the toile. Worse when has been sitting for awhile. Feels very painful to move from sitting to standing position.  Says will radiate down to her knee and sometime into herl toes, especially on her right side.  NSAID (mobic) not helping at all. Heat feels good. Hasn't tried massage.  Not sure if can touch her toes.  Says tylenol arthritis will help. She did do physical therapy a couple years ago and it was very helpful at the time. She does report feeling like sometimes her hips feel weak, like it is hard to stand up. Note she did have an elevated CK on her previous lab work.  HTN- Pt denies chest pain, SOB, dizziness, or heart palpitations.  Taking meds as directed w/o problems.  Denies medication side effects.     Iron deficiency anemia-she has been taking iron supplement. She was unable to take it twice a day because of GI irritation so backed down to once a day and is taking Colace, stool softener, with it and this has worked well.   Review of Systems     Objective:   Physical Exam  Constitutional: She is oriented to person, place, and time. She appears well-developed and well-nourished.  HENT:  Head: Normocephalic and atraumatic.  Cardiovascular: Normal rate, regular rhythm and normal heart sounds.   Pulmonary/Chest: Effort normal and breath sounds normal.  Musculoskeletal:       Normal flexion, extension, rotation right and left, side bending of the lumbar spine. Negative straight leg raise bilaterally. Hip, knee, ankle strength is 5 out of 5 bilaterally. She is tender over the right paraspinous muscles and into the right but not.  Neurological: She is alert and oriented to person, place, and time.  Skin: Skin is  warm and dry.  Psychiatric: She has a normal mood and affect. Her behavior is normal.          Assessment & Plan:  Low Back Pain-suspect herniated disc with sciatic-type symptoms. Recommend physical therapy since she is somewhat this in the past. She has not benefited from steroids so I held off on those. Stop the NSAIDs since it is not helpful. Tylenol Arthritis seems to be at least giving her some relief so this is okay to use per directions on the bottle. Do not take more than that it can damage the liver. Patient says she's aware. Followup in one month if she's not improving with physical therapy. She can certainly follow with me or possibly my partner Dr. Rodney Langton if she is not improving.  Right Hip Pain - we did do a rheumatologic workup at the last visit. Her findings were negative for possible rheumatoid arthritis. She was complaining of pain in multiple joints at that time. Because she still has persistent right hip pain I would like to get an x-ray to evaluate for possible arthritis. She is also done well with physical therapy in the past so would like to schedule for that. In addition consider that her low back pain may be radiating down into the hip and is likely the primary cause of her hip pain.  HTN- Well controlled today. Continue current regimen.  Followup in 6 months.  Elevated CK-recheck levels today. Explained to patient it is a muscle enzyme. It was slightly elevated but not as high as is typically seen in certain muscle disorders. She's not really on any medications that should be causing this elevation. It can also happen with trauma and mild dehydration. Thus we will just recheck today.  Iron deficiency anemia-recheck ferritin and hemoglobin today.

## 2012-01-27 LAB — CBC
MCH: 27.5 pg (ref 26.0–34.0)
MCHC: 33.9 g/dL (ref 30.0–36.0)
MCV: 81.1 fL (ref 78.0–100.0)
Platelets: 326 10*3/uL (ref 150–400)
RDW: 14.4 % (ref 11.5–15.5)
WBC: 7.9 10*3/uL (ref 4.0–10.5)

## 2012-01-30 ENCOUNTER — Other Ambulatory Visit: Payer: Self-pay | Admitting: Family Medicine

## 2012-01-30 MED ORDER — HYDROCODONE-ACETAMINOPHEN 5-325 MG PO TABS: 1.0000 | ORAL_TABLET | Freq: Every evening | ORAL | Status: DC | PRN

## 2012-02-13 ENCOUNTER — Other Ambulatory Visit: Payer: Self-pay | Admitting: Family Medicine

## 2012-02-16 ENCOUNTER — Ambulatory Visit (INDEPENDENT_AMBULATORY_CARE_PROVIDER_SITE_OTHER): Payer: Commercial Managed Care - PPO | Admitting: Sports Medicine

## 2012-02-16 ENCOUNTER — Encounter: Payer: Self-pay | Admitting: Sports Medicine

## 2012-02-16 VITALS — BP 180/86 | HR 82 | Wt 229.0 lb

## 2012-02-16 DIAGNOSIS — M545 Low back pain, unspecified: Secondary | ICD-10-CM

## 2012-02-16 DIAGNOSIS — M47817 Spondylosis without myelopathy or radiculopathy, lumbosacral region: Secondary | ICD-10-CM | POA: Insufficient documentation

## 2012-02-16 NOTE — Assessment & Plan Note (Signed)
With pain localized in the right radiating into the buttock and down the posterior aspect of the thigh but not past the knee, also worse with extension this is suggestive of right facet arthrosis. My personal review of the MRI shows a normal L5-S1 level, right-sided facet arthrosis is moderate at the L4-L5 level as well as a left-sided disc protrusion, at the L3-L4 level there is also a left-sided disc protrusion with mild bilateral facet arthrosis. She has already had an epidural injection which was only minimally beneficial. She's also had formal physical therapy, for approximately 3 months total. As her MRI is 54 years old and would like to obtain a new one for further injection planning. I would also like to schedule her for a right-sided L4-5 facet injection. She should come back to see me after the MRI and after the facet injection. If no improvement after the facet injection, I do think that we should look further at the lumbar spine and the discs as possible pain generating structures.

## 2012-02-16 NOTE — Progress Notes (Signed)
  Subjective:    I'm seeing this patient as a consultation for:  Dr. Linford Arnold  CC: Right low back and hip pain  HPI: This is a very pleasant 52 year old female with several years history of pain which he localizes deep in the right buttock, lower back, and occasionally in the groin. Pain is worse with sitting on a chair for long periods of time, and tends to be worse with extension. It is not worse with Valsalva. Radiation is down into the right buttock and occasionally down into the right thigh but almost never passed the right knee. She has been through formal physical therapy for at least 3 months, which was moderately effective but seems to have plateaued. She also had an epidural injection by Dr. Ethelene Hal which was only minimally beneficial. She has also had an intra-articular right-sided hip injection which provided no benefit not even on the day of injection. She has not had her facet joint evaluated. Last MRI was in 2011.  Past medical history, Surgical history, Family history not pertinant except as noted below, Social history, Allergies, and medications have been entered into the medical record, reviewed, and no changes needed.   Review of Systems: No headache, visual changes, nausea, vomiting, diarrhea, constipation, dizziness, abdominal pain, skin rash, fevers, chills, night sweats, weight loss, swollen lymph nodes, body aches, joint swelling, muscle aches, chest pain, shortness of breath, mood changes, visual or auditory hallucinations.   Objective:   General: Well Developed, well nourished, and in no acute distress.  Neuro/Psych: Alert and oriented x3, extra-ocular muscles intact, able to move all 4 extremities, sensation grossly intact. Skin: Warm and dry, no rashes noted.  Respiratory: Not using accessory muscles, speaking in full sentences, trachea midline.  Cardiovascular: Pulses palpable, no extremity edema. Abdomen: Does not appear distended. Back Exam:  Inspection:  Unremarkable  Motion: Flexion 45 deg, Extension 45 deg, Side Bending to 45 deg bilaterally,  Rotation to 45 deg bilaterally.  Extension with rotation to the right is limited, and reproduces pain in the back. SLR laying: Negative  XSLR laying: Negative  Palpable tenderness: None. FABER: negative. Sensory change: Gross sensation intact to all lumbar and sacral dermatomes.  Reflexes: 2+ at both patellar tendons, 2+ at achilles tendons, Babinski's downgoing.  Strength at foot  Plantar-flexion: 5/5 Dorsi-flexion: 5/5 Eversion: 5/5 Inversion: 5/5  Leg strength  Quad: 5/5 Hamstring: 5/5 Hip flexor: 5/5 Hip abductors: 5/5  Gait unremarkable. Right Hip: ROM IR: 45 Deg, ER: 45 Deg, Flexion: 120 Deg, Extension: 100 Deg, Abduction: 45 Deg, Adduction: 45 Deg Strength IR: 5/5, ER: 5/5, Flexion: 5/5, Extension: 5/5, Abduction: 5/5, Adduction: 5/5 Pelvic alignment unremarkable to inspection and palpation. Standing hip rotation and gait without trendelenburg sign / unsteadiness. Greater trochanter without tenderness to palpation. No tenderness over the ischial tuberosity. No tenderness over piriformis and greater trochanter. No pain with FABER or FADIR. No SI joint tenderness and normal minimal SI movement.  I reviewed her MRI myself, it shows multilevel degenerative disc disease with left-sided disc protrusions at the L4-L5 level and L3-L4 level. The L5-S1 level is unremarkable. There is also right sided facet spondylosis predominately on the L4-L5 level and mildly above at the L3-L4 level. To me, the predominant finding is right-sided L4-L5 spondylosis.  Impression and Recommendations:   This case required medical decision making of moderate complexity.

## 2012-02-20 ENCOUNTER — Encounter: Payer: Self-pay | Admitting: Sports Medicine

## 2012-02-22 ENCOUNTER — Ambulatory Visit (INDEPENDENT_AMBULATORY_CARE_PROVIDER_SITE_OTHER): Payer: BC Managed Care – PPO | Admitting: Sports Medicine

## 2012-02-22 DIAGNOSIS — M545 Low back pain, unspecified: Secondary | ICD-10-CM

## 2012-02-22 NOTE — Progress Notes (Signed)
  Subjective:    CC: Followup MRI  HPI: This very pleasant 52 year old female comes in for follow up of her low back pain and new MRI. She's had epidural injections in the past by Dr. Ethelene Hal that were of minimal efficacy. She localizes her pain on the right deep in the buttock radiating down the posterior thigh but not past the knee. The pain is worse with extension. Overall she is doing very well with physical therapy but unfortunately still has significant pain in the above location.  Past medical history, Surgical history, Family history not pertinant except as noted below, Social history, Allergies, and medications have been entered into the medical record, reviewed, and no changes needed.   Review of Systems: No headache, visual changes, nausea, vomiting, diarrhea, constipation, dizziness, abdominal pain, skin rash, fevers, chills, night sweats, weight loss, swollen lymph nodes, body aches, joint swelling, muscle aches, chest pain, shortness of breath, mood changes, visual or auditory hallucinations.   Objective:   General: Well Developed, well nourished, and in no acute distress.  Neuro/Psych: Alert and oriented x3, extra-ocular muscles intact, able to move all 4 extremities, sensation grossly intact. Skin: Warm and dry, no rashes noted.  Respiratory: Not using accessory muscles, speaking in full sentences, trachea midline.  Cardiovascular: Pulses palpable, no extremity edema. Abdomen: Does not appear distended. Back Exam:  Inspection: Unremarkable  Motion: Flexion 45 deg, Extension 45 deg, Side Bending to 45 deg bilaterally,  Rotation to 45 deg bilaterally. Extension with rotation to the right is limited, and reproduces pain in the back.  SLR laying: Negative  XSLR laying: Negative  Palpable tenderness: None.  FABER: negative.  Sensory change: Gross sensation intact to all lumbar and sacral dermatomes.  Reflexes: 2+ at both patellar tendons, 2+ at achilles tendons, Babinski's  downgoing.  Strength at foot  Plantar-flexion: 5/5 Dorsi-flexion: 5/5 Eversion: 5/5 Inversion: 5/5  Leg strength  Quad: 5/5 Hamstring: 5/5 Hip flexor: 5/5 Hip abductors: 5/5  Gait unremarkable.  Lumbar spine MRI from February 20, 2012 shows at the L4-5 level left foraminal disc contacting the exiting left L4 nerve root, this also appears to contact both descending L5 nerve roots. At the L5-S1 level there is a small central disc protrusion without resulting stenosis. There is also mild lumbar spondylosis the L4-L5 level. Impression and Recommendations:   This case required medical decision making of moderate complexity.

## 2012-02-22 NOTE — Assessment & Plan Note (Signed)
We reviewed the results of the MRI, there is L4-L5 bilateral facet spondylosis moderate on the right side and I do think this is the cause of her pain. Her discs are not too bad. She does have a right L4/L5 facet injection scheduled for a few days from now, and I would like to touch base with her at some point afterwards regarding her benefit. She will probably send me a my chart message.

## 2012-02-25 ENCOUNTER — Other Ambulatory Visit: Payer: Self-pay

## 2012-02-27 ENCOUNTER — Ambulatory Visit
Admission: RE | Admit: 2012-02-27 | Discharge: 2012-02-27 | Disposition: A | Discharge status: Home / Self Care | Payer: BC Managed Care – PPO | Source: Ambulatory Visit | Attending: Sports Medicine | Admitting: Sports Medicine

## 2012-02-27 ENCOUNTER — Encounter: Payer: Self-pay | Admitting: Sports Medicine

## 2012-02-27 MED ORDER — METHYLPREDNISOLONE ACETATE 40 MG/ML INJ SUSP (RADIOLOG
120.0000 mg | Freq: Once | INTRAMUSCULAR | Status: AC
  Administered 2012-02-27: 120 mg via INTRA_ARTICULAR

## 2012-02-27 MED ORDER — IOHEXOL 180 MG/ML  SOLN
1.0000 mL | Freq: Once | INTRAMUSCULAR | Status: AC | PRN
  Administered 2012-02-27: 1 mL via INTRA_ARTICULAR

## 2012-02-27 NOTE — Progress Notes (Signed)
Pt can not bear weight, Dr. Alfredo Batty helped me get pt in wheelchair and pt is sitting with her sister in the waiting area. Dr. Alfredo Batty will be here until pt is able to leave.

## 2012-02-27 NOTE — Progress Notes (Signed)
Dr. Alfredo Batty in to visit, pt is now able to flex feet and move her legs. Still can't lift her feet off the bed.

## 2012-02-27 NOTE — Progress Notes (Addendum)
Pt has had "numb" legs since admission to nursing area. Dr. Alfredo Batty in to visit several times.

## 2012-02-27 NOTE — Progress Notes (Signed)
Pt is able to sit up and move legs but legs are still to weak for her to stand.

## 2012-02-27 NOTE — Progress Notes (Signed)
Pt's legs are still numb and pt needs max assist to turn side to side. Dr. Alfredo Batty is aware.

## 2012-02-28 ENCOUNTER — Encounter: Payer: Self-pay | Admitting: Family Medicine

## 2012-02-28 MED ORDER — LOSARTAN POTASSIUM 100 MG PO TABS: ORAL_TABLET | ORAL | Status: DC

## 2012-02-28 MED ORDER — CLONIDINE HCL 0.1 MG/24HR TD PTWK: 1.0000 | MEDICATED_PATCH | TRANSDERMAL | Status: DC

## 2012-02-28 MED ORDER — NEBIVOLOL HCL 20 MG PO TABS: ORAL_TABLET | ORAL | Status: DC

## 2012-02-28 MED ORDER — VERAPAMIL HCL ER 180 MG PO TBCR: 180.0000 mg | EXTENDED_RELEASE_TABLET | Freq: Every day | ORAL | Status: DC

## 2012-02-29 ENCOUNTER — Other Ambulatory Visit: Payer: Self-pay | Admitting: Family Medicine

## 2012-03-08 ENCOUNTER — Encounter: Payer: Self-pay | Admitting: Sports Medicine

## 2012-03-08 ENCOUNTER — Ambulatory Visit (INDEPENDENT_AMBULATORY_CARE_PROVIDER_SITE_OTHER): Payer: BC Managed Care – PPO | Admitting: Sports Medicine

## 2012-03-08 DIAGNOSIS — M353 Polymyalgia rheumatica: Secondary | ICD-10-CM

## 2012-03-08 DIAGNOSIS — R200 Anesthesia of skin: Secondary | ICD-10-CM

## 2012-03-08 DIAGNOSIS — M47817 Spondylosis without myelopathy or radiculopathy, lumbosacral region: Secondary | ICD-10-CM

## 2012-03-08 DIAGNOSIS — M5380 Other specified dorsopathies, site unspecified: Secondary | ICD-10-CM

## 2012-03-08 MED ORDER — PREDNISONE 50 MG PO TABS: ORAL_TABLET | ORAL | Status: DC

## 2012-03-08 NOTE — Progress Notes (Signed)
  Subjective:    CC: Followup after injection.  HPI:  Donna Dyer comes back for followup of low back pain after her L4-L5 right sided facet block. She had a very interesting experience. After the facet block both legs went numb for approximately 5 hours. The facetogenic pain however has completely resolved.  The numbness did resolve, but now she just complains of an achiness over both hips, thighs, down to the knees. The pain is not reducible.  In the past she did have an elevated ESR and CK levels.  Past medical history, Surgical history, Family history not pertinant except as noted below, Social history, Allergies, and medications have been entered into the medical record, reviewed, and no changes needed.   Review of Systems: No headache, visual changes, nausea, vomiting, diarrhea, constipation, dizziness, abdominal pain, skin rash, fevers, chills, night sweats, weight loss, swollen lymph nodes, body aches, joint swelling, muscle aches, chest pain, shortness of breath, mood changes, visual or auditory hallucinations.   Objective:   General: Well Developed, well nourished, and in no acute distress.  Neuro/Psych: Alert and oriented x3, extra-ocular muscles intact, able to move all 4 extremities, sensation grossly intact. Skin: Warm and dry, no rashes noted.  Respiratory: Not using accessory muscles, speaking in full sentences, trachea midline.  Cardiovascular: Pulses palpable, no extremity edema. Abdomen: Does not appear distended. Back Exam:  Inspection: Unremarkable  Motion: Flexion 45 deg, Extension 45 deg, Side Bending to 45 deg bilaterally,  Rotation to 45 deg bilaterally  SLR laying: Negative  XSLR laying: Negative  Palpable tenderness: None. FABER: negative. Sensory change: Gross sensation intact to all lumbar and sacral dermatomes.  Reflexes: 2+ at both patellar tendons, 2+ at achilles tendons, Babinski's downgoing.  Strength at foot  Plantar-flexion: 5/5 Dorsi-flexion: 5/5 Eversion:  5/5 Inversion: 5/5  Leg strength  Quad: 5/5 Hamstring: 5/5 Hip flexor: 5/5 Hip abductors: 5/5  Gait unremarkable. Impression and Recommendations:   This case required medical decision making of moderate complexity.

## 2012-03-08 NOTE — Patient Instructions (Addendum)
Polymyalgia Rheumatica Polymyalgia rheumatica (also called PMR or polymyalgia) is a rheumatologic (arthritic) condition that causes pain and morning stiffness in your neck, shoulders, and hips. It is an inflammatory condition. In some people, inflammation of certain structures in the shoulder, hips, or other joints can be seen on special testing. It does not cause joint destruction, as occurs in other arthritic conditions. It usually occurs after 54 years of age, and is more common as you age. It can be confused with several other diseases, but it is usually easily treated. People with PMR often have, or can develop, a more severe rheumatologic condition called giant cell arteritis (also called CGA or temporal arteritis).  CAUSES  The exact cause of PMR is not known.   There are genetic factors involved.  Viruses have been suspected in the cause of PMR. This has not been proven. SYMPTOMS   Aching, pain, and morning stiffness your neck, both shoulders, or both hips.  Symptoms usually start slowly and build gradually.  Morning stiffness usually lasts at least 30 minutes.  Swelling and tenderness in other joints of the arms, hands, legs, and feet may occur.  Swelling and inflammation in the wrists can cause nerve inflammation at the wrist (carpal tunnel syndrome).  You may also have low grade fever, fatigue, weakness, decreased appetite and weight loss. DIAGNOSIS   Your caregiver may suspect that you have PMR based on your description of your symptoms and on your exam.  Your caregiver will examine you to be sure you do not have diseases that can be confused with PMR. These diseases include rheumatoid arthritis, fibromyalgia, or thyroid disease.  Your caregiver should check for signs of giant cell arteritis. This can cause serious complications such as blindness.  Lab tests can help confirm that you have PMR and not other diseases, but are sometimes inconclusive.  X-rays cannot show PMR.  However, it can identify other diseases like rheumatoid arthritis. Your caregiver may have you see a specialist in arthritis and inflammatory diseases (rheumatologist). TREATMENT  The goal of treatment is relief of symptoms. Treatment does not shorten the course of the illness or prevent complications. With proper treatment, you usually feel better almost right away.   The initial treatment of PMR is usually cortisone (steroid) medication, such as prednisone or prednisolone. Your caregiver will help determine a starting dose, which is usually a low to moderate dose. The dose is gradually reduced every few weeks to months. Treatment usually lasts one to three years.  Other stronger medications are rarely needed. They will only be prescribed if your symptoms do not get better on cortisone medication alone, or if they recur as the dose is reduced.  Cortisone medication can have different side effects. With the doses of cortisone needed for PMR, the side effects are usually mild. Discuss this with your caregiver.  Your caregiver will evaluate you regularly during your treatment. They will do this in order to assess progress and to check for complications of the illness or treatment.  Physical therapy is sometimes useful. This is especially true if your joints are still stiff after other symptoms have improved. HOME CARE INSTRUCTIONS   Follow your caregiver's instructions. Do not change your dose of cortisone medication on your own.  Keep your appointments for follow-up lab tests and caregiver visits. Your lab tests need to be monitored. You must get checked periodically for giant cell arteritis.  Follow your caregiver's guidance regarding physical activity (usually no restrictions are needed) or physical therapy.  Your caregiver may have instructions to prevent or check for side effects from cortisone medication (including bone density testing or treatment). Follow their instructions  carefully. SEEK MEDICAL CARE IF:   You develop any side effects from treatment. Side effects can include:  Elevated blood pressure.  High blood sugar (or worsening of diabetes, if you are diabetic).  Difficulty fighting off infections.  Weight gain.  Weakness of the bones (osteoporosis).  Your aches, pains, morning stiffness, or other symptoms get worse with time. This is especially true after your dose of cortisone is reduced.  You develop new joint symptoms (pain, swelling, etc.) SEEK IMMEDIATE MEDICAL CARE IF:   You develop a severe headache.  You start vomiting.  You have problems with your vision.  You have an oral temperature above 102 F (38.9 C), not controlled by medicine. Document Released: 02/04/2004 Document Revised: 03/21/2011 Document Reviewed: 05/19/2008 [facility] Patient Information 2013 [place], [place].

## 2012-03-08 NOTE — Assessment & Plan Note (Signed)
The injection of the right L4-L5 facet joint has resulted in 100% relief of the facet joint pain. Unfortunately she did have a rare side effect of some numbness in both lower extremities for approximately 5 hours which has mostly resolved. She is unfortunately still having some achiness in the hip girdle muscles, and on further review of her chart has had an elevated ESR and CK levels. This is highly suspicious for polymyalgia rheumatica, and I do think that it is a strange coincidence of the flare of polymyalgia rheumatica as well as her injection. I would like her to do 5 days of prednisone and I am going to recheck some blood work. Should CK continued to be elevated we could certainly consider muscle biopsy.

## 2012-03-08 NOTE — Assessment & Plan Note (Signed)
The injection of the right L4-L5 facet joint has resulted in 100% relief of the facet joint pain. Unfortunately she did have a rare side effect of some numbness in both lower extremities for approximately 5 hours which has mostly resolved. She is unfortunately still having some achiness in the hip girdle muscles, and on further review of her chart has had an elevated ESR and CK levels. This is highly suspicious for polymyalgia rheumatica, and I do think that it is a strange coincidence of the flare of polymyalgia rheumatica as well as her injection. I would like her to do 5 days of prednisone and I am going to recheck some blood work. Should CK continued to be elevated we could certainly consider muscle biopsy. 

## 2012-03-09 LAB — CBC
HCT: 36.3 % (ref 36.0–46.0)
Hemoglobin: 12.5 g/dL (ref 12.0–15.0)
MCH: 27.3 pg (ref 26.0–34.0)
MCHC: 34.4 g/dL (ref 30.0–36.0)
MCV: 79.3 fL (ref 78.0–100.0)
Platelets: 323 10*3/uL (ref 150–400)
RBC: 4.58 MIL/uL (ref 3.87–5.11)
RDW: 15.2 % (ref 11.5–15.5)
WBC: 9.7 10*3/uL (ref 4.0–10.5)

## 2012-03-09 LAB — COMPREHENSIVE METABOLIC PANEL
ALT: 24 U/L (ref 0–35)
AST: 18 U/L (ref 0–37)
Albumin: 4.2 g/dL (ref 3.5–5.2)
Calcium: 9.9 mg/dL (ref 8.4–10.5)
Chloride: 97 mEq/L (ref 96–112)
Creat: 0.79 mg/dL (ref 0.50–1.10)
Potassium: 3.8 mEq/L (ref 3.5–5.3)

## 2012-03-09 LAB — COMPREHENSIVE METABOLIC PANEL WITH GFR
Alkaline Phosphatase: 122 U/L — ABNORMAL HIGH (ref 39–117)
BUN: 14 mg/dL (ref 6–23)
CO2: 30 meq/L (ref 19–32)
Glucose, Bld: 105 mg/dL — ABNORMAL HIGH (ref 70–99)
Sodium: 137 meq/L (ref 135–145)
Total Bilirubin: 0.4 mg/dL (ref 0.3–1.2)
Total Protein: 8.2 g/dL (ref 6.0–8.3)

## 2012-03-09 LAB — CK: Total CK: 145 U/L (ref 7–177)

## 2012-03-09 LAB — ANA: Anti Nuclear Antibody(ANA): NEGATIVE

## 2012-03-09 LAB — SEDIMENTATION RATE: Sed Rate: 32 mm/h — ABNORMAL HIGH (ref 0–22)

## 2012-03-09 LAB — HIGH SENSITIVITY CRP: CRP, High Sensitivity: 23.5 mg/L — ABNORMAL HIGH

## 2012-03-13 ENCOUNTER — Ambulatory Visit (HOSPITAL_BASED_OUTPATIENT_CLINIC_OR_DEPARTMENT_OTHER)
Admission: RE | Admit: 2012-03-13 | Discharge: 2012-03-13 | Disposition: A | Discharge status: Home / Self Care | Payer: Commercial Managed Care - PPO | Source: Ambulatory Visit | Attending: Sports Medicine | Admitting: Sports Medicine

## 2012-03-13 ENCOUNTER — Telehealth: Payer: Self-pay | Admitting: *Deleted

## 2012-03-13 DIAGNOSIS — M5126 Other intervertebral disc displacement, lumbar region: Secondary | ICD-10-CM | POA: Insufficient documentation

## 2012-03-13 DIAGNOSIS — R209 Unspecified disturbances of skin sensation: Secondary | ICD-10-CM | POA: Insufficient documentation

## 2012-03-13 DIAGNOSIS — M5137 Other intervertebral disc degeneration, lumbosacral region: Secondary | ICD-10-CM | POA: Insufficient documentation

## 2012-03-13 DIAGNOSIS — M51379 Other intervertebral disc degeneration, lumbosacral region without mention of lumbar back pain or lower extremity pain: Secondary | ICD-10-CM | POA: Insufficient documentation

## 2012-03-13 NOTE — Telephone Encounter (Signed)
CT Lumbar spine w/o contrast requires no PA for either insurance. Carolyn at [facility] informed and will call pt and schedule CT scan.

## 2012-03-15 ENCOUNTER — Ambulatory Visit: Payer: Commercial Managed Care - PPO | Attending: Gynecologic Oncology | Admitting: Gynecologic Oncology

## 2012-03-15 ENCOUNTER — Encounter: Payer: Self-pay | Admitting: Gynecologic Oncology

## 2012-03-15 ENCOUNTER — Other Ambulatory Visit (HOSPITAL_COMMUNITY)
Admission: RE | Admit: 2012-03-15 | Discharge: 2012-03-15 | Disposition: A | Discharge status: Home / Self Care | Payer: Commercial Managed Care - PPO | Source: Ambulatory Visit | Attending: Gynecologic Oncology | Admitting: Gynecologic Oncology

## 2012-03-15 VITALS — BP 142/84 | HR 64 | Temp 98.2°F | Resp 16 | Ht 67.21 in | Wt 222.8 lb

## 2012-03-15 DIAGNOSIS — R209 Unspecified disturbances of skin sensation: Secondary | ICD-10-CM | POA: Insufficient documentation

## 2012-03-15 DIAGNOSIS — D072 Carcinoma in situ of vagina: Secondary | ICD-10-CM | POA: Insufficient documentation

## 2012-03-15 DIAGNOSIS — R29898 Other symptoms and signs involving the musculoskeletal system: Secondary | ICD-10-CM | POA: Insufficient documentation

## 2012-03-15 DIAGNOSIS — Z01419 Encounter for gynecological examination (general) (routine) without abnormal findings: Secondary | ICD-10-CM | POA: Insufficient documentation

## 2012-03-15 NOTE — Progress Notes (Signed)
Follow Up Note: Gyn-Onc  Donna Dyer 52 y.o. female  CC:  Chief Complaint  Patient presents with  . VAIN III    Follow up   HPI:  Donna Dyer is a 52 year old female referred to GYN Oncology by Dr. 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  Gravida 3 Para 2.  History includes a TAH, RSO in 1995 for endometriosis.  She has had abnormal pap tests for 2 years since 06/2009.  Pap test in 12/2010 and 07/2011 revealed LGSIL, with the pap in 07/2011 resulting HPV/ mild dysplasia/VAIN 1.  Colposcopy was performed on 08/03/11 with a vaginal biopsy resulting VAIN III.  On 09/07/11, she underwent CO2 laser vaporization of the upper vagina by Dr. Stanford Breed.    Interval History:   Is doing well she denies any vaginal bleeding or discharge.  On 02/27/2012 she underwent a spinal procedure for management of back pain. Since that time she's had less strength in the right lower extremity in addition to complaints of numbness and coolness  Review of Systems  Constitutional:  Feels normal  Genitourinary:  No frequency, urgency, dysuria, no vaginal bleeding.  Musculoskeletal:  No myalgia, R lower extremity instability.  Right lower extremity is cooler than the left pulses are equal bilaterally Neurologic:  No weakness, reports numbness from the buttocks to the toes of the right lower extremity, gait is unsteady.  Psychology:  No depression, anxiety, or insomnia.   Past Medical Hx:  Past Medical History  Diagnosis Date  . Hyperlipidemia   . Hypertension   . Anemia   . Endometriosis   . Pain, joint, hip, right     chronic- Dr Charlann Boxer Dr Ethelene Hal  . Edema     LLE- podiatry in [place]  . Diabetes mellitus   . Goiter   . Hemorrhoids    Family Hx:  Family History  Problem Relation Age of Onset  . Hyperlipidemia Mother   . Cancer Father     bladder  . Heart attack Father     MI at age 12  . Diabetes Sister   . Diabetes Brother   . Diabetes Brother   . Diabetes Brother   . Diabetes  Brother   . Sarcoidosis Brother   . Sarcoidosis Sister   . Anesthesia problems Neg Hx   . Hypotension Neg Hx   . Malignant hyperthermia Neg Hx   . Pseudochol deficiency Neg Hx    Vitals:  Blood pressure 142/84, pulse 64, temperature 98.2 F (36.8 C), resp. rate 16, height 5' 7.21" (1.707 m), weight 222 lb 12.8 oz (101.061 kg).  Physical Exam:  General:  Well developed, well nourished female in no acute distress.  Alert and oriented x 3.   Abdomen:  Normoactive bowel sounds, abdomen soft, non-tender and obese.  Back:  No CVA tenderness  Genitourinary:    Vulva/vagina: Normal external female genitalia. No lesions. No discharge or bleeding.    Vagina: Atrophic. Vagina healed with no visible lesion . No palpable vaginal or cul-de-sac masses Extremities:  : Coolness of the right lower extremity from approximately 3 inches below the knee in comparison to the contralateral lower extremity  Assessment/Plan: Donna Dyer is a 52 y.o.  year old female with a history of VAIN III.  She underwent CO2 laser vaporization of the upper vagina on September 07, 2011. Pap test collected today.  If negative can f/u with Dr. Henderson Cloud in 6 months   [place], [facility],  03/15/2012, 2:17 PM

## 2012-03-15 NOTE — Patient Instructions (Signed)
Pap test collected today.  If negative can f/u with Dr. Henderson Cloud in 6 months   Thank you very much Ms. Donna Dyer for allowing me to provide care for you today.  I appreciate your confidence in choosing our Gynecologic Oncology team.  If you have any questions about your visit today please call our office and we will get back to you as soon as possible.  [name]. Brewster MD., PhD Gynecologic Oncology

## 2012-03-21 ENCOUNTER — Telehealth: Payer: Self-pay | Admitting: *Deleted

## 2012-03-21 NOTE — Telephone Encounter (Signed)
Message left for pt with PAP results  

## 2012-03-22 ENCOUNTER — Other Ambulatory Visit: Payer: Self-pay | Admitting: *Deleted

## 2012-03-22 ENCOUNTER — Ambulatory Visit (INDEPENDENT_AMBULATORY_CARE_PROVIDER_SITE_OTHER): Payer: BC Managed Care – PPO | Admitting: Sports Medicine

## 2012-03-22 ENCOUNTER — Encounter: Payer: Self-pay | Admitting: Sports Medicine

## 2012-03-22 VITALS — BP 172/85 | HR 85 | Wt 224.0 lb

## 2012-03-22 DIAGNOSIS — R7301 Impaired fasting glucose: Secondary | ICD-10-CM

## 2012-03-22 DIAGNOSIS — M47817 Spondylosis without myelopathy or radiculopathy, lumbosacral region: Secondary | ICD-10-CM

## 2012-03-22 DIAGNOSIS — M353 Polymyalgia rheumatica: Secondary | ICD-10-CM

## 2012-03-22 MED ORDER — GLUCOSE BLOOD VI STRP: ORAL_STRIP | Status: DC

## 2012-03-22 NOTE — Assessment & Plan Note (Signed)
With vague bilateral hip girdle soreness and an erythrocyte sedimentation rate has ranged from 45 one year ago to 32 more recently. Most recent treatment with prednisone if her excellent benefit the first day, however pain returned and is currently still present and stable. She's also had mildly elevated CK levels in the past. I would like her to see a rheumatologist.

## 2012-03-22 NOTE — Assessment & Plan Note (Signed)
Pain continues to be resolved after right-sided L4-L5 facet block. She's not yet ready to proceed to medial branch blocks and radiofrequency ablation procedures considering her experience with the facet block.

## 2012-03-22 NOTE — Progress Notes (Signed)
  Subjective:    CC: Followup   HPI: Polymyalgia rheumatica: Donna Dyer has had an elevated ESR, it has been as high as 45 year ago and more recently was 32. She had also been having some vague hip girdle pain and weakness. This was without any articular pathology on hip x-rays. I diagnosed her with polymyalgia rheumatica and started a prednisone burst at the last visit. Her pain completely resolved after the initial dose of prednisone, unfortunately has since recurred. It is again located over the hip girdle and associated with weakness. Interestingly, this all flared after a facet block.  Low back pain: This is likely facetogenic, she's been to formal physical therapy, oral steroids, muscle relaxers. MRI had showed right-sided L4-L5 facet spondylosis, this was blocked and the facetogenic pain remains resolved.  Past medical history, Surgical history, Family history not pertinant except as noted below, Social history, Allergies, and medications have been entered into the medical record, reviewed, and no changes needed.   Review of Systems: No headache, visual changes, nausea, vomiting, diarrhea, constipation, dizziness, abdominal pain, skin rash, fevers, chills, night sweats, weight loss, swollen lymph nodes, body aches, joint swelling, muscle aches, chest pain, shortness of breath, mood changes, visual or auditory hallucinations.   Objective:   General: Well Developed, well nourished, and in no acute distress.  Neuro/Psych: Alert and oriented x3, extra-ocular muscles intact, able to move all 4 extremities, sensation grossly intact. Skin: Warm and dry, no rashes noted.  Respiratory: Not using accessory muscles, speaking in full sentences, trachea midline.  Cardiovascular: Pulses palpable, no extremity edema. Abdomen: Does not appear distended. Impression and Recommendations:   This case required medical decision making of moderate complexity.

## 2012-03-22 NOTE — Assessment & Plan Note (Signed)
Needs refill on "test strips"

## 2012-05-18 ENCOUNTER — Telehealth: Payer: Self-pay | Admitting: Hematology & Oncology

## 2012-05-18 NOTE — Telephone Encounter (Signed)
Pt aware of 06-15-12

## 2012-05-19 ENCOUNTER — Other Ambulatory Visit: Payer: Self-pay | Admitting: Oncology

## 2012-06-15 ENCOUNTER — Ambulatory Visit (HOSPITAL_BASED_OUTPATIENT_CLINIC_OR_DEPARTMENT_OTHER): Payer: Commercial Managed Care - PPO | Admitting: Lab

## 2012-06-15 ENCOUNTER — Ambulatory Visit (HOSPITAL_BASED_OUTPATIENT_CLINIC_OR_DEPARTMENT_OTHER)
Admission: RE | Admit: 2012-06-15 | Discharge: 2012-06-15 | Disposition: A | Discharge status: Home / Self Care | Payer: Commercial Managed Care - PPO | Source: Ambulatory Visit | Attending: Hematology & Oncology | Admitting: Hematology & Oncology

## 2012-06-15 ENCOUNTER — Ambulatory Visit (HOSPITAL_BASED_OUTPATIENT_CLINIC_OR_DEPARTMENT_OTHER): Payer: Commercial Managed Care - PPO | Admitting: Hematology & Oncology

## 2012-06-15 ENCOUNTER — Ambulatory Visit: Payer: Commercial Managed Care - PPO

## 2012-06-15 VITALS — BP 156/79 | HR 73 | Temp 98.1°F | Resp 16 | Ht 66.0 in | Wt 223.0 lb

## 2012-06-15 DIAGNOSIS — D472 Monoclonal gammopathy: Secondary | ICD-10-CM

## 2012-06-15 DIAGNOSIS — R9389 Abnormal findings on diagnostic imaging of other specified body structures: Secondary | ICD-10-CM | POA: Insufficient documentation

## 2012-06-15 DIAGNOSIS — M25559 Pain in unspecified hip: Secondary | ICD-10-CM

## 2012-06-15 LAB — CBC WITH DIFFERENTIAL (CANCER CENTER ONLY)
BASO%: 0.3 % (ref 0.0–2.0)
LYMPH#: 2.3 10*3/uL (ref 0.9–3.3)
MONO#: 0.5 10*3/uL (ref 0.1–0.9)
Platelets: 273 10*3/uL (ref 145–400)
RDW: 13 % (ref 11.1–15.7)
WBC: 7.8 10*3/uL (ref 3.9–10.0)

## 2012-06-15 LAB — CHCC SATELLITE - SMEAR

## 2012-06-15 NOTE — Progress Notes (Signed)
This office note has been dictated.

## 2012-06-16 DIAGNOSIS — IMO0002 Reserved for concepts with insufficient information to code with codable children: Secondary | ICD-10-CM | POA: Insufficient documentation

## 2012-06-16 NOTE — Progress Notes (Signed)
CC:   Pollyann Savoy, M.D. [name], M.D.  DIAGNOSIS:  IgG kappa monoclonal gammopathy of undetermined significance (MGUS).  HISTORY OF PRESENT ILLNESS:  Donna Dyer is a very charming 52 year old African American female.  She is followed by Dr. Linford Arnold.  Dr. Linford Arnold sent her to Dr. Corliss Skains because of, I think, some potential rheumatological issues.  Dr. Corliss Skains did her battery of rheumatological tests.  It was unclear as to what was really going on with Ms. Donna Dyer.  Ms. Fuhrer had been complaining of pain over the right hip.  It sounds like she may have some kind of bursitis or possibly a piriformis syndrome.  However, with the battery of tests that Dr. Corliss Skains did, Donna Dyer was found to have a monoclonal spike.  The M-spike was only 1.12 gm/dL. She did have quantitative immunoglobulins.  Her IgG was mildly elevated at 2340 mg/dL.  She had normal IgA and IgM levels.  Her creatine kinase was quite elevated.  She had a mildly elevated sedimentation rate.  This all was done back in April.  A CBC was done which showed a white cell count of 8.1, hemoglobin 11.5, hematocrit 34.3, platelet count 311,000.  She had a normal BUN and creatinine.  Calcium was 9.4, with an albumin of 4.3.  Again, Dr. Corliss Skains was concerned regarding the monoclonal spike and kindly referred Donna Dyer to the [facility] for further evaluation.  Donna Dyer does feel tired.  She does have the pain in the right hip.  She has had some weight gain.  She has had no bleeding.  There has been no change in bowel or bladder habits.  She does get routine mammograms.  She, I think, did have some rectal bleeding.  She has had a colonoscopy, I think, within the past few years.  Of note, she did have a CT scan of her lumbar spine back in March.  The CT scan did not show any obvious bony lesions.  She did have significant degenerative changes.  Interestingly  enough, she went to have a facet block.  She said that after the facet block, she was paralyzed from the waist down for a few hours.  She has slowly gotten better from this,  she says.  Back in, I think, February she had an MRI of the lumbosacral spine. Again, she did not have any obvious bony lesions.  The marrow signal was okay.  PAST MEDICAL HISTORY: 1. Hyperlipidemia. 2. Non-insulin-dependent diabetes. 3. Status post hysterectomy for endometriosis. 4. Hypertension. 5. Goiter.  ALLERGIES: 1. Norvasc. 2. Mycin antibiotics. 3. Chlorthalidone. 4. Codeine. 5. Hydrochlorothiazide. 6. Metformin. 7. Sulfonamides. 8. Pineapple.  MEDICATIONS: 1. Calcium, I think, with vitamin D daily. 2. Catapres 0.1-mg patch to the skin q. week. 3. Flexeril 10 mg p.o. at bedtime p.r.n. 4. Estrace cream as needed. 5. Allegra 180 mg p.o. daily. 6. Vicodin 1 p.o. q.8 hours p.r.n. 7. Cozaar 100 mg p.o. daily. 8. Bystolic 20 mg p.o. daily. 9. Calan SR 180 mg p.o. daily.  SOCIAL HISTORY:  Negative for tobacco use.  There is no alcohol use. She has no obvious occupational exposures.  FAMILY HISTORY:  Unremarkable for any obvious hematologic condition.  REVIEW OF SYSTEMS:  As stated in the History of Present Illness.  No additional findings are noted on a 12-system review.  PHYSICAL EXAMINATION:  General:  This is a well-developed, well- nourished [place] American female, in no obvious distress.  Vital signs: Temperature of 98.1, pulse 73, respiratory rate 16,  blood pressure 156/79.  Weight is 223.  Head and neck:  Normocephalic, atraumatic skull.  There are no ocular or oral lesions.  There are no palpable cervical or supraclavicular lymph nodes.  Lungs:  Clear to percussion and auscultation bilaterally.  Cardiac:  Regular rate and rhythm, with a normal S1, S2.  There are no murmurs, rubs or bruits.  Abdomen:  Soft with good bowel sounds.  There is no palpable abdominal mass.  There is no  fluid wave.  There is no palpable hepatosplenomegaly.  Back:  No tenderness over the spine, ribs, or hips.  Axillae:  Shows no bilateral axillary adenopathy.  Extremities:  Show no clubbing, cyanosis or edema. Neurological:  Shows no focal neurological deficits.  Skin:  No rashes, ecchymoses or petechia.  LABORATORY STUDIES:  White cell count 7.8, hemoglobin 11.7, hematocrit 34.9, platelet count 273,000.  MCV is 85.  Bone survey shows no obvious lytic lesions on the bone survey.  There is a questionable issue at C4 and left humeral neck.  However, the radiologist was not convinced that this represented myelomatous lesions.  Her sodium is 136, potassium 4.1, BUN 13, creatinine 0.85.  Total protein 7.7, with an albumin of 4.3.  Calcium 9.2.  LDH is 181.  IMPRESSION:  Donna Dyer is a very nice 52 year old African American female.  I suspect that she has an monoclonal gammopathy of undetermined significance (MGUS).  I do not suspect that this is myeloma from what I have seen with her lab work and radiological studies.  I have to believe that this MGUS is reactive to whatever else is going on with her.  She does have a lot of medical issues.  She is on several medications.  I just do not feel that a bone marrow biopsy is indicated right now.  I just do not see that this is going to change how we would manage Ms. Donna Dyer.  I believe that we probably do need to follow Ms.  Donna Dyer.  She could certainly be at risk for having progression of her monoclonal gammopathy of undetermined significance.  I probably would like to get her back in about 3 or 4 months.  I think this would be reasonable.  We will then follow her monoclonal studies serially.  The bone survey certainly does not look suspicious to me.  Again, we can follow this in the future if clinically indicated.  I spent a good hour or so with Ms. Donna Dyer.  I did give her a prayer blanket.  She was very appreciative of  this.    ______________________________ Josph Macho, M.D. PRE/MEDQ  D:  06/15/2012  T:  06/16/2012  Job:  [id number]

## 2012-06-18 ENCOUNTER — Telehealth: Payer: Self-pay | Admitting: Hematology & Oncology

## 2012-06-18 NOTE — Telephone Encounter (Signed)
Mailed 9-11 schedule

## 2012-06-19 LAB — PROTEIN ELECTROPHORESIS, SERUM, WITH REFLEX
Albumin ELP: 49.7 % — ABNORMAL LOW (ref 55.8–66.1)
Beta 2: 6 % (ref 3.2–6.5)
Beta Globulin: 6.3 % (ref 4.7–7.2)
Gamma Globulin: 23.2 % — ABNORMAL HIGH (ref 11.1–18.8)

## 2012-06-19 LAB — COMPREHENSIVE METABOLIC PANEL
AST: 23 U/L (ref 0–37)
BUN: 13 mg/dL (ref 6–23)
Calcium: 9.2 mg/dL (ref 8.4–10.5)
Chloride: 101 mEq/L (ref 96–112)
Creatinine, Ser: 0.85 mg/dL (ref 0.50–1.10)

## 2012-06-19 LAB — IGG, IGA, IGM
IgG (Immunoglobin G), Serum: 1930 mg/dL — ABNORMAL HIGH (ref 690–1700)
IgM, Serum: 106 mg/dL (ref 52–322)

## 2012-07-06 ENCOUNTER — Telehealth: Payer: Self-pay | Admitting: Oncology

## 2012-07-06 NOTE — Telephone Encounter (Addendum)
Message copied by Lacie Draft on Fri Jul 06, 2012  1:26 PM ------      Message from: Josph Macho      Created: Fri Jul 06, 2012  6:58 AM       Call - labs look pretty stable.  Pete ------Left message on answering machine.

## 2012-07-19 DIAGNOSIS — G4710 Hypersomnia, unspecified: Secondary | ICD-10-CM | POA: Insufficient documentation

## 2012-07-19 DIAGNOSIS — E785 Hyperlipidemia, unspecified: Secondary | ICD-10-CM | POA: Insufficient documentation

## 2012-07-19 DIAGNOSIS — E1169 Type 2 diabetes mellitus with other specified complication: Secondary | ICD-10-CM | POA: Insufficient documentation

## 2012-07-19 DIAGNOSIS — G4730 Sleep apnea, unspecified: Secondary | ICD-10-CM | POA: Insufficient documentation

## 2012-07-19 DIAGNOSIS — R209 Unspecified disturbances of skin sensation: Secondary | ICD-10-CM | POA: Insufficient documentation

## 2012-08-23 ENCOUNTER — Telehealth: Payer: Self-pay | Admitting: Hematology & Oncology

## 2012-08-23 NOTE — Telephone Encounter (Signed)
Pt aware moved 9-11 to 9-12

## 2012-09-11 ENCOUNTER — Encounter: Payer: Self-pay | Admitting: Family Medicine

## 2012-09-11 ENCOUNTER — Ambulatory Visit (INDEPENDENT_AMBULATORY_CARE_PROVIDER_SITE_OTHER): Payer: Commercial Managed Care - PPO | Admitting: Family Medicine

## 2012-09-11 VITALS — BP 146/84 | HR 69 | Ht 66.0 in | Wt 225.0 lb

## 2012-09-11 DIAGNOSIS — M353 Polymyalgia rheumatica: Secondary | ICD-10-CM

## 2012-09-11 DIAGNOSIS — I10 Essential (primary) hypertension: Secondary | ICD-10-CM

## 2012-09-11 DIAGNOSIS — R7301 Impaired fasting glucose: Secondary | ICD-10-CM

## 2012-09-11 DIAGNOSIS — E119 Type 2 diabetes mellitus without complications: Secondary | ICD-10-CM

## 2012-09-11 MED ORDER — METFORMIN HCL 500 MG PO TABS: 500.0000 mg | ORAL_TABLET | Freq: Every day | ORAL | Status: DC

## 2012-09-11 MED ORDER — NEBIVOLOL HCL 20 MG PO TABS: ORAL_TABLET | ORAL | Status: DC

## 2012-09-11 MED ORDER — FEXOFENADINE HCL 180 MG PO TABS: 180.0000 mg | ORAL_TABLET | Freq: Every day | ORAL | Status: DC

## 2012-09-11 MED ORDER — LOSARTAN POTASSIUM 100 MG PO TABS: ORAL_TABLET | ORAL | Status: DC

## 2012-09-11 NOTE — Progress Notes (Signed)
Subjective:    Patient ID: Donna Dyer, female    DOB: Jan 17, 1960, 52 y.o.   MRN: [id number]  HPI HTN- pt stated that her rheumatologist said that the Verapamil is causing her to retain water and to talk with you about this. On bystolic, losartan, clonidine. Hasn't tolerated diuretics or amlodipine well in the past.  Donna Dyer needs refills. Ankles are swelling some. Has been wearing compression hose to control her swelling, but doesn't have them on today.   IFG - Due to rechedk A1C. Has been on steorids with her rheumatologist.  Has also been dx with MGUS since I last saw her.   PMR - Has been doing water aerobics 4 days per week for the last several weeks.  Off of steroids.  Has been using arthritis tylenol an dis helping her pain.  Donna Dyer's also been eating a low salt diet.  Review of Systems BP 146/84  Pulse 69  Ht 5\' 6"  (1.676 m)  Wt 225 lb (102.059 kg)  BMI 36.33 kg/m2    Allergies  Allergen Reactions  . Pineapple Itching and Swelling    Tongue swells  . Amlodipine Besylate Other (See Comments)    muscle cramps  . Azithromycin Itching and Rash  . Chlorthalidone Other (See Comments)    Muscle cramps  . Codeine Nausea And Vomiting  . Hctz [Hydrochlorothiazide] Other (See Comments)    Muscle spasms    . Metformin And Related Diarrhea  . Sulfonamide Derivatives Rash    Past Medical History  Diagnosis Date  . Hyperlipidemia   . Hypertension   . Anemia   . Endometriosis   . Pain, joint, hip, right     chronic- Dr Charlann Boxer Dr Ethelene Hal  . Edema     LLE- podiatry in [place]  . Diabetes mellitus   . Goiter   . Hemorrhoids     Past Surgical History  Procedure Laterality Date  . Esophagogastroduodenoscopy  7-11    for anemia  . Total abdominal hysterectomy  1995    bilat oophorectomy/ endometriosis  . Flexible sigmoidoscopy  01/31/2011    Procedure: FLEXIBLE SIGMOIDOSCOPY;  Surgeon: Barrie Folk, MD;  Location: [facility] ENDOSCOPY;  Service: Endoscopy;  Laterality: N/A;  . Vulvar lesion  removal  09/07/2011    Procedure: VULVAR LESION;  Surgeon: Jeannette Corpus, MD;  Location: [facility];  Service: Gynecology;  Laterality: N/A;  vaginal lesion  . Back injections      History   Social History  . Marital Status: Married    Spouse Name: N/A    Number of Children: 2  . Years of Education: N/A   Occupational History  . 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 Smoking status: Never Smoker   . Smokeless tobacco: Never Used  . Alcohol Use: No     Comment: Doesn't drink   . Drug Use: No  . Sexual Activity: Yes    Birth Control/ Protection: Surgical   Other Topics Concern  . Not on file   Social History Narrative   Lives in [place] with husband and daughter. Ambulatory, no cane or walker. Works at [employer].     Family History  Problem Relation Age of Onset  . Hyperlipidemia Mother   . Cancer Father     bladder  . Heart attack Father     MI at age 50  . Diabetes Sister   . Diabetes Brother   . Diabetes  Brother   . Diabetes Brother   . Diabetes Brother   . Sarcoidosis Brother   . Sarcoidosis Sister   . Anesthesia problems Neg Hx   . Hypotension Neg Hx   . Malignant hyperthermia Neg Hx   . Pseudochol deficiency Neg Hx     Outpatient Encounter Prescriptions as of 09/11/2012  Medication Sig Dispense Refill  . Acetaminophen (TYLENOL EXTRA STRENGTH PO) Take 2 tablets by mouth as needed.      . AMBULATORY NON FORMULARY MEDICATION Medication Name: Glucometer, strips, and lancets. Check once a day. Dx: Impaired fasting glucose.  1 Units  0  . calcium carbonate (TUMS - DOSED IN MG ELEMENTAL CALCIUM) 500 MG chewable tablet Chew 1 tablet by mouth daily as needed. For indigestion      . CALCIUM CARBONATE PO Take 1 tablet by mouth. Chewable tablet      . Cholecalciferol (VITAMIN D) 2000 UNITS CAPS Take 2,000 Units by mouth daily.       . cloNIDine (CATAPRES - DOSED IN MG/24 HR) 0.1 mg/24hr patch  Place 1 patch (0.1 mg total) onto the skin once a week.  12 patch  1  . estradiol (ESTRACE) 0.1 MG/GM vaginal cream Place 2 g vaginally 2 (two) times a week. Use on Sunday and Wednesday      . fexofenadine (ALLEGRA) 180 MG tablet Take 1 tablet (180 mg total) by mouth daily.  90 tablet  1  . losartan (COZAAR) 100 MG tablet TAKE 1 TABLET DAILY  90 tablet  1  . Multiple Vitamin (MULTIVITAMIN) tablet Take 1 tablet by mouth daily.        . Nebivolol HCl (BYSTOLIC) 20 MG TABS TAKE 1 TABLET DAILY  90 tablet  1  . verapamil (CALAN-SR) 180 MG CR tablet TAKE 1 TABLET BY MOUTH EVERY DAY  30 tablet  1  . [DISCONTINUED] fexofenadine (ALLEGRA) 180 MG tablet Take 180 mg by mouth daily.      . [DISCONTINUED] losartan (COZAAR) 100 MG tablet TAKE 1 TABLET DAILY  90 tablet  1  . [DISCONTINUED] Nebivolol HCl (BYSTOLIC) 20 MG TABS TAKE 1 TABLET DAILY  90 tablet  1  . metFORMIN (GLUCOPHAGE) 500 MG tablet Take 1 tablet (500 mg total) by mouth daily with breakfast.  30 tablet  3  . [DISCONTINUED] cyclobenzaprine (FLEXERIL) 10 MG tablet TAKE 1/2 TO 1 TABLET AT BEDTIME AS NEEDED FOR MUSCLE SPASMS  30 tablet  0  . [DISCONTINUED] docusate sodium (COLACE) 100 MG capsule Take 1 capsule (100 mg total) by mouth 2 (two) times daily.  60 capsule  1  . [DISCONTINUED] HYDROcodone-acetaminophen (NORCO/VICODIN) 5-325 MG per tablet Take 1 tablet by mouth at bedtime as needed for pain.  30 tablet  0   No facility-administered encounter medications on file as of 09/11/2012.          Objective:   Physical Exam  Constitutional: Donna Dyer is oriented to person, place, and time. Donna Dyer appears well-developed and well-nourished.  HENT:  Head: Normocephalic and atraumatic.  Cardiovascular: Normal rate, regular rhythm and normal heart sounds.   Pulmonary/Chest: Effort normal and breath sounds normal.  Neurological: Donna Dyer is alert and oriented to person, place, and time.  Skin: Skin is warm and dry.  Psychiatric: Donna Dyer has a normal mood and affect.  Her behavior is normal.          Assessment & Plan:  HTN - uncontrolled. Discussed 3 different options. We discussed that we could continue her current regimen and focus  primarily on exercise and weight loss. We could add spironolactone which Donna Dyer has not tried in the past. Donna Dyer has been intolerant to diuretics such as Lasix and his chlorothiazide in the past. Work on low salt diet. Donna Dyer still has some Lasix at home from a previous prescription. We discussed maybe just taking it once or twice a week to help with the fluid overload instead of taking it daily and seeing if that helps control her symptoms without actually causing muscle cramping. Donna Dyer said Donna Dyer never had any palms with her potassium will taking the Lasix. Her numbers were always good. Continue clonidine, Bystolic, Arava no, losartan  DM- New dx. Donna Dyer has bounced between diabetes and impaired fasting glucose previously. I suspect her recent course of prolonged steroid may be a trigger. Will start metformon. Has taken before and had diarrhea. Donna Dyer is willing to retry it, a lower dose. Please call me if Donna Dyer has any significant problems. Otherwise followup in 2-3 months to make sure that Donna Dyer's tolerating it well and recheck hemoglobin A1c at that point in time.  Polymyalgia rheumatica-currently following with rheumatology. They're mainly focusing on lifestyle changes and are not treating with steroids currently.Marland Kitchen

## 2012-09-11 NOTE — Patient Instructions (Addendum)
Keep up the exercise.  

## 2012-09-12 ENCOUNTER — Other Ambulatory Visit: Payer: Self-pay | Admitting: *Deleted

## 2012-09-12 MED ORDER — METFORMIN HCL 500 MG PO TABS: 500.0000 mg | ORAL_TABLET | Freq: Every day | ORAL | Status: DC

## 2012-09-14 ENCOUNTER — Ambulatory Visit: Payer: Commercial Managed Care - PPO | Admitting: Family Medicine

## 2012-09-18 ENCOUNTER — Other Ambulatory Visit: Payer: Self-pay | Admitting: Family Medicine

## 2012-09-20 ENCOUNTER — Ambulatory Visit: Payer: Commercial Managed Care - PPO | Admitting: Hematology & Oncology

## 2012-09-20 ENCOUNTER — Other Ambulatory Visit: Payer: Commercial Managed Care - PPO | Admitting: Lab

## 2012-09-21 ENCOUNTER — Ambulatory Visit (HOSPITAL_BASED_OUTPATIENT_CLINIC_OR_DEPARTMENT_OTHER): Payer: Commercial Managed Care - PPO | Admitting: Hematology & Oncology

## 2012-09-21 ENCOUNTER — Other Ambulatory Visit: Payer: Self-pay | Admitting: *Deleted

## 2012-09-21 ENCOUNTER — Ambulatory Visit (HOSPITAL_BASED_OUTPATIENT_CLINIC_OR_DEPARTMENT_OTHER): Payer: Commercial Managed Care - PPO | Admitting: Lab

## 2012-09-21 VITALS — BP 152/75 | HR 66 | Temp 98.2°F | Resp 16 | Ht 66.0 in | Wt 222.0 lb

## 2012-09-21 DIAGNOSIS — D472 Monoclonal gammopathy: Secondary | ICD-10-CM

## 2012-09-21 LAB — CBC WITH DIFFERENTIAL (CANCER CENTER ONLY)
BASO#: 0 10*3/uL (ref 0.0–0.2)
EOS%: 1.2 % (ref 0.0–7.0)
Eosinophils Absolute: 0.1 10*3/uL (ref 0.0–0.5)
HCT: 35.7 % (ref 34.8–46.6)
HGB: 11.9 g/dL (ref 11.6–15.9)
MCH: 28.1 pg (ref 26.0–34.0)
MCHC: 33.3 g/dL (ref 32.0–36.0)
MCV: 84 fL (ref 81–101)
MONO%: 7.6 % (ref 0.0–13.0)
NEUT%: 58.5 % (ref 39.6–80.0)
RBC: 4.23 10*6/uL (ref 3.70–5.32)

## 2012-09-21 MED ORDER — AMBULATORY NON FORMULARY MEDICATION: Status: DC

## 2012-09-21 NOTE — Progress Notes (Signed)
This office note has been dictated.

## 2012-09-24 NOTE — Progress Notes (Signed)
CC:   Pollyann Savoy, M.D. [name], M.D.  DIAGNOSIS:  IgG kappa monoclonal gammopathy of undetermined significance.  CURRENT THERAPY:  Observation.  INTERIM HISTORY:  [name] comes in for followup.  We first saw her back in June.  At that point in time, her workup did not show any obvious myeloma changes.  She had a bone survey done.  The bone survey did not show any obvious lytic lesions.  Of course, the radiologist said there was some "questionable lucency" at C4.  She is not complaining of any pain in the neck.  Her lab studies when we saw her showed a monoclonal spike of 1.35 g/dL. IgG level was 1930 mg/dL.  She is now on metformin.  She has diabetes.  Patient has a very high C- reactive protein.  Again, I could not imagine this being anything related to her having an MGUS.  Today, we checked her iron studies.  Her ferritin was 218.  She has normal renal function.  She has had no fevers, sweats, or chills.  She has had no change in bowel or bladder habits.  She has had a little bit of diarrhea with the metformin.  PHYSICAL EXAMINATION:  General:  This is a well-developed, well- nourished African American female in no obvious distress.  Vital signs: Temperature of 98.2, pulse 56, respiratory rate 16, blood pressure 152/75.  Weight is 222.  Head and neck:  Normocephalic, atraumatic skull.  There are no ocular or oral lesions.  There are no palpable cervical or supraclavicular lymph nodes.  Lungs:  Clear bilaterally. Cardiac:  Regular rate and rhythm with a normal S1 and S2.  There are no murmurs, rubs or bruits.  Abdomen:  Soft.  She has good bowel sounds. There is no fluid wave.  There is no palpable hepatosplenomegaly. Extremities:  No clubbing, cyanosis or edema.  She has good range motion of her joints.  She has good muscle strength.  Skin:  No rashes. Neurological:  No focal neurological deficits.  LABORATORY STUDIES:  White cell count 7.4,  hemoglobin 11.9, hematocrit 35.7, platelet count 275.  Peripheral smear shows no rouleaux formation.  There are no abnormal white cells.  She has had no immature myeloid cells.  She has no teardrop cells.  She has no schistocytes.  The platelets are adequate in number and size.  I did review this myself.  IMPRESSION:  Donna Dyer is a 52 year old African American female with an IgG kappa monoclonal gammopathy of undetermined significance.  Again, I do not see this as being a problem for her.  She has other health issues.  The MGUS, one would think, would be a reflection of her other issues.  I think we can probably get her back in 4 months now.  I do not see any need for lab work in between visits.    ______________________________ Josph Macho, M.D. PRE/MEDQ  D:  09/20/2012  T:  09/22/2012  Job:  [id number]

## 2012-09-25 LAB — COMPREHENSIVE METABOLIC PANEL
AST: 15 U/L (ref 0–37)
Albumin: 4 g/dL (ref 3.5–5.2)
Alkaline Phosphatase: 109 U/L (ref 39–117)
BUN: 15 mg/dL (ref 6–23)
Calcium: 9 mg/dL (ref 8.4–10.5)
Creatinine, Ser: 0.85 mg/dL (ref 0.50–1.10)
Glucose, Bld: 97 mg/dL (ref 70–99)
Potassium: 4.2 mEq/L (ref 3.5–5.3)

## 2012-09-25 LAB — PROTEIN ELECTROPHORESIS, SERUM, WITH REFLEX
Albumin ELP: 49 % — ABNORMAL LOW (ref 55.8–66.1)
Alpha-1-Globulin: 4.3 % (ref 2.9–4.9)
Alpha-2-Globulin: 10.5 % (ref 7.1–11.8)
Gamma Globulin: 23.7 % — ABNORMAL HIGH (ref 11.1–18.8)

## 2012-09-25 LAB — IGG, IGA, IGM
IgG (Immunoglobin G), Serum: 1810 mg/dL — ABNORMAL HIGH (ref 690–1700)
IgM, Serum: 93 mg/dL (ref 52–322)

## 2012-09-25 LAB — IFE INTERPRETATION

## 2012-09-25 LAB — LACTATE DEHYDROGENASE: LDH: 148 U/L (ref 94–250)

## 2012-09-25 LAB — KAPPA/LAMBDA LIGHT CHAINS: Kappa:Lambda Ratio: 3.06 — ABNORMAL HIGH (ref 0.26–1.65)

## 2012-10-01 ENCOUNTER — Encounter: Payer: Self-pay | Admitting: Family Medicine

## 2012-10-01 MED ORDER — VERAPAMIL HCL ER 180 MG PO TBCR: 180.0000 mg | EXTENDED_RELEASE_TABLET | Freq: Every day | ORAL | Status: DC

## 2012-10-01 NOTE — Telephone Encounter (Signed)
I do want you to take the verapamil. Not sure what happened. I just sent new rx to your mailorder.

## 2012-10-02 ENCOUNTER — Other Ambulatory Visit: Payer: Self-pay | Admitting: *Deleted

## 2012-10-02 MED ORDER — HYDROCORTISONE ACETATE 25 MG RE SUPP: 25.0000 mg | Freq: Two times a day (BID) | RECTAL | Status: AC

## 2012-10-04 ENCOUNTER — Other Ambulatory Visit: Payer: Self-pay | Admitting: *Deleted

## 2012-10-04 MED ORDER — VERAPAMIL HCL ER 180 MG PO TBCR: 180.0000 mg | EXTENDED_RELEASE_TABLET | Freq: Every day | ORAL | Status: DC

## 2012-10-04 NOTE — Progress Notes (Signed)
Sent #7 of verapamil to [facility] in [place] to bridge pt over until she [facility].  She called & stated that her BP last night without the med was 175/105 so she called the nurse line & they sent her 2 pills.

## 2012-10-17 ENCOUNTER — Ambulatory Visit: Payer: Commercial Managed Care - PPO | Admitting: Hematology & Oncology

## 2012-10-17 ENCOUNTER — Other Ambulatory Visit: Payer: Commercial Managed Care - PPO | Admitting: Lab

## 2012-11-15 ENCOUNTER — Other Ambulatory Visit: Payer: Self-pay

## 2012-11-19 ENCOUNTER — Other Ambulatory Visit: Payer: Self-pay | Admitting: Family Medicine

## 2012-12-11 ENCOUNTER — Encounter: Payer: Self-pay | Admitting: Family Medicine

## 2012-12-11 ENCOUNTER — Ambulatory Visit (INDEPENDENT_AMBULATORY_CARE_PROVIDER_SITE_OTHER): Payer: Commercial Managed Care - PPO | Admitting: Family Medicine

## 2012-12-11 VITALS — BP 138/80 | HR 74 | Temp 98.4°F | Wt 226.0 lb

## 2012-12-11 DIAGNOSIS — R7301 Impaired fasting glucose: Secondary | ICD-10-CM

## 2012-12-11 DIAGNOSIS — Z862 Personal history of diseases of the blood and blood-forming organs and certain disorders involving the immune mechanism: Secondary | ICD-10-CM

## 2012-12-11 DIAGNOSIS — I10 Essential (primary) hypertension: Secondary | ICD-10-CM

## 2012-12-11 LAB — POCT GLYCOSYLATED HEMOGLOBIN (HGB A1C): Hemoglobin A1C: 6.1

## 2012-12-11 NOTE — Progress Notes (Signed)
   Subjective:    Patient ID: Donna Dyer, female    DOB: 1961-01-02, 52 y.o.   MRN: [id number]  HPI IFG - Says can't take it every day because will get diarrhea nd then will get rectal bleeding. She's worried she may be anemic again. She has been working on diet. Not really exercising.  She says she just gotten out of the routine. Lab Results  Component Value Date   HGBA1C 6.1 12/11/2012   HTN-  Pt denies chest pain, SOB, dizziness, or heart palpitations.  Taking meds as directed w/o problems.  Denies medication side effects.     Review of Systems     Objective:   Physical Exam  Constitutional: She is oriented to person, place, and time. She appears well-developed and well-nourished.  HENT:  Head: Normocephalic and atraumatic.  Cardiovascular: Normal rate, regular rhythm and normal heart sounds.   Pulmonary/Chest: Effort normal and breath sounds normal.  Neurological: She is alert and oriented to person, place, and time.  Skin: Skin is warm and dry.  Psychiatric: She has a normal mood and affect. Her behavior is normal.          Assessment & Plan:  IFG - Well controlled.  I am concerned about the metformin so causing diarrhea even on such a low dose of 500 mg daily. She says she just filled a 90 day prescription and thus wants to try to use that before she pays for something else. I did give her some samples of onglyza 2.5 mg to take daily to try when she gets close to the end of the metformin. If this works well it is covered first tear on her insurance and should be be reasonably Priced with a coupon card.  Hypertension-borderline elevated today. Given samples of bystolic 20 mg. followup in 3 months to make sure well controlled. Consider switching to [place] if not maximally controlled at next visit.

## 2012-12-12 ENCOUNTER — Telehealth: Payer: Self-pay

## 2012-12-12 LAB — CBC WITH DIFFERENTIAL/PLATELET
Basophils Absolute: 0 10*3/uL (ref 0.0–0.1)
Eosinophils Absolute: 0.1 10*3/uL (ref 0.0–0.7)
Eosinophils Relative: 1 % (ref 0–5)
Lymphocytes Relative: 34 % (ref 12–46)
MCV: 82 fL (ref 78.0–100.0)
Neutro Abs: 4.6 10*3/uL (ref 1.7–7.7)
Platelets: 301 10*3/uL (ref 150–400)
RDW: 14.5 % (ref 11.5–15.5)
WBC: 7.8 10*3/uL (ref 4.0–10.5)

## 2012-12-12 NOTE — Telephone Encounter (Signed)
Patient called and left a message on nurse line asking for a return call.   Returned Call: Left message asking patient to call back.  

## 2012-12-26 ENCOUNTER — Encounter: Payer: Self-pay | Admitting: Family Medicine

## 2012-12-26 ENCOUNTER — Other Ambulatory Visit: Payer: Self-pay | Admitting: Family Medicine

## 2012-12-26 MED ORDER — NEBIVOLOL HCL 20 MG PO TABS: ORAL_TABLET | ORAL | Status: DC

## 2012-12-27 ENCOUNTER — Other Ambulatory Visit: Payer: Self-pay | Admitting: Family Medicine

## 2012-12-27 MED ORDER — NEBIVOLOL HCL 10 MG PO TABS: 20.0000 mg | ORAL_TABLET | Freq: Every day | ORAL | Status: DC

## 2013-01-25 ENCOUNTER — Ambulatory Visit (HOSPITAL_BASED_OUTPATIENT_CLINIC_OR_DEPARTMENT_OTHER): Payer: Commercial Managed Care - PPO | Admitting: Hematology & Oncology

## 2013-01-25 ENCOUNTER — Ambulatory Visit (HOSPITAL_BASED_OUTPATIENT_CLINIC_OR_DEPARTMENT_OTHER): Payer: Commercial Managed Care - PPO | Admitting: Lab

## 2013-01-25 ENCOUNTER — Encounter: Payer: Self-pay | Admitting: Hematology & Oncology

## 2013-01-25 VITALS — BP 149/73 | HR 66 | Temp 98.3°F | Resp 14 | Ht 66.0 in | Wt 228.0 lb

## 2013-01-25 DIAGNOSIS — D472 Monoclonal gammopathy: Secondary | ICD-10-CM

## 2013-01-25 DIAGNOSIS — D509 Iron deficiency anemia, unspecified: Secondary | ICD-10-CM

## 2013-01-25 LAB — CBC WITH DIFFERENTIAL (CANCER CENTER ONLY)
BASO#: 0 10*3/uL (ref 0.0–0.2)
BASO%: 0.2 % (ref 0.0–2.0)
EOS%: 2.1 % (ref 0.0–7.0)
Eosinophils Absolute: 0.2 10*3/uL (ref 0.0–0.5)
HEMATOCRIT: 33.9 % — AB (ref 34.8–46.6)
HGB: 10.9 g/dL — ABNORMAL LOW (ref 11.6–15.9)
LYMPH#: 2.5 10*3/uL (ref 0.9–3.3)
LYMPH%: 31.2 % (ref 14.0–48.0)
MCH: 27 pg (ref 26.0–34.0)
MCHC: 32.2 g/dL (ref 32.0–36.0)
MCV: 84 fL (ref 81–101)
MONO#: 0.6 10*3/uL (ref 0.1–0.9)
MONO%: 7.8 % (ref 0.0–13.0)
NEUT#: 4.8 10*3/uL (ref 1.5–6.5)
NEUT%: 58.7 % (ref 39.6–80.0)
PLATELETS: 268 10*3/uL (ref 145–400)
RBC: 4.03 10*6/uL (ref 3.70–5.32)
RDW: 13.4 % (ref 11.1–15.7)
WBC: 8.1 10*3/uL (ref 3.9–10.0)

## 2013-01-25 NOTE — Progress Notes (Signed)
This office note has been dictated.

## 2013-01-26 NOTE — Progress Notes (Signed)
DIAGNOSES: 1. IgG kappa monoclonal gammopathy of undetermined significance. 2. Anemia - possible iron deficiency.  CURRENT THERAPY:  Observation.  INTERIM HISTORY:  Donna Dyer comes in for followup.  We last saw her back in September.  At that point in time, her lab work that we did everything was pretty stable.  Her monoclonal spike was 1.26 g/dL.  IgG level was 1810 mg/dL.  Kappa light chain was 3.73 mg/dL.  She feels fairly well.  She is having some issues with respect to Pap smears.  She has had a hysterectomy.  I told her, however, that she can still get issues with residual cervix that is left behind.  She wondered if the problem with the cervix are related to blood.  I told her that I did not think so.  She has had no problems with bony pain.  We did get a bone survey on her when we first saw her.  This turned out fairly well.  Of course, the radiologist had mentioned a possible issue at C4.  She is having some problems with the metformin.  She is having some diarrhea with the metformin.  She has had no leg swelling.  She has had no rashes.  She has had no cough or shortness of breath.  PHYSICAL EXAMINATION:  General:  This is a well-developed, well nourished African-American female in no obvious distress.  Vital Signs: Temperature of 98.3, pulse 66, respiratory rate 14, blood pressure 149/73, weight is 228 pounds.  Head and Neck:  Normocephalic, atraumatic skull.  There are no ocular or oral lesions.  There are no palpable cervical or supraclavicular lymph nodes.  Lungs:  Clear bilaterally. Cardiac:  Regular rate and rhythm with a normal S1, S2.  There are no murmurs, rubs, or bruits.  Abdomen:  Soft.  She has good bowel sounds. There is no fluid wave.  There is no palpable hepatosplenomegaly.  Back: No tenderness over the spine, ribs, or hips.  Extremities:  No clubbing, cyanosis, or edema.  Neurological:  No focal neurological deficits.  LABORATORY STUDIES:  White  cell count is 8.1, hemoglobin 10.9, hematocrit 33.9, platelet count is 268.  MCV is 84.  Her peripheral smear shows good maturation of her red blood cells.  I see no target cells.  She has no nucleated red blood cells.  I see no rouleaux formation.  There are no immature myeloid cells.  There are no atypical lymphocytes.  I see no hypersegmented polys.  Platelets are adequate in number and size.  IMPRESSION:  Donna Dyer is a very nice 53 year old African-American female.  She has a IgG kappa monoclonal gammopathy of undetermined significance, this I think is going to be reactive.  She does have a history of polymyalgia.  She did have an elevated C- reactive protein.  I do not see that we have to do any bone marrow test on her.  I really feel that we can just follow her along.  We have been seeing her I think for about a year.  I think if everything looks good, we will not see her back, we can probably think about moving her __________ further.  We will check her iron studies for the anemia.  __________ possible that she may need some IV iron.  She has had some history of internal hemorrhoid bleeding.    ______________________________ [name], M.D. PRE/MEDQ  D:  01/25/2013  T:  01/26/2013  Job:  [id number]

## 2013-01-28 LAB — IRON AND TIBC CHCC
%SAT: 13 % — AB (ref 21–57)
Iron: 42 ug/dL (ref 41–142)
TIBC: 323 ug/dL (ref 236–444)
UIBC: 280 ug/dL (ref 120–384)

## 2013-01-28 LAB — FERRITIN CHCC: Ferritin: 81 ng/ml (ref 9–269)

## 2013-01-29 LAB — PROTEIN ELECTROPHORESIS, SERUM, WITH REFLEX
ALBUMIN ELP: 50.6 % — AB (ref 55.8–66.1)
ALPHA-1-GLOBULIN: 4.1 % (ref 2.9–4.9)
Alpha-2-Globulin: 10.1 % (ref 7.1–11.8)
Beta 2: 5 % (ref 3.2–6.5)
Beta Globulin: 6.6 % (ref 4.7–7.2)
Gamma Globulin: 23.6 % — ABNORMAL HIGH (ref 11.1–18.8)
M-Spike, %: 1.3 g/dL
TOTAL PROTEIN, SERUM ELECTROPHOR: 7.8 g/dL (ref 6.0–8.3)

## 2013-01-29 LAB — KAPPA/LAMBDA LIGHT CHAINS
KAPPA FREE LGHT CHN: 6.59 mg/dL — AB (ref 0.33–1.94)
Kappa:Lambda Ratio: 4.67 — ABNORMAL HIGH (ref 0.26–1.65)
LAMBDA FREE LGHT CHN: 1.41 mg/dL (ref 0.57–2.63)

## 2013-01-29 LAB — IGG, IGA, IGM
IgA: 251 mg/dL (ref 69–380)
IgG (Immunoglobin G), Serum: 2120 mg/dL — ABNORMAL HIGH (ref 690–1700)
IgM, Serum: 104 mg/dL (ref 52–322)

## 2013-01-29 LAB — IFE INTERPRETATION

## 2013-01-29 LAB — LACTATE DEHYDROGENASE: LDH: 161 U/L (ref 94–250)

## 2013-02-11 ENCOUNTER — Other Ambulatory Visit: Payer: Self-pay | Admitting: Family Medicine

## 2013-02-15 ENCOUNTER — Other Ambulatory Visit: Payer: Self-pay | Admitting: Family Medicine

## 2013-03-07 ENCOUNTER — Other Ambulatory Visit: Payer: Self-pay | Admitting: Family Medicine

## 2013-03-08 NOTE — Telephone Encounter (Signed)
Can you look at this and see if patient is still taking?  Looks like last office visit a med was changed but not sure.  Thanks Clemetine Marker, LPN

## 2013-03-11 ENCOUNTER — Ambulatory Visit: Payer: Commercial Managed Care - PPO | Admitting: Family Medicine

## 2013-03-12 ENCOUNTER — Encounter: Payer: Self-pay | Admitting: Family Medicine

## 2013-03-12 ENCOUNTER — Ambulatory Visit (INDEPENDENT_AMBULATORY_CARE_PROVIDER_SITE_OTHER): Payer: Commercial Managed Care - PPO | Admitting: Family Medicine

## 2013-03-12 VITALS — BP 145/78 | HR 69 | Ht 66.0 in | Wt 220.0 lb

## 2013-03-12 DIAGNOSIS — I10 Essential (primary) hypertension: Secondary | ICD-10-CM

## 2013-03-12 DIAGNOSIS — R7301 Impaired fasting glucose: Secondary | ICD-10-CM

## 2013-03-12 LAB — POCT GLYCOSYLATED HEMOGLOBIN (HGB A1C): Hemoglobin A1C: 6.6

## 2013-03-12 MED ORDER — CLONIDINE HCL 0.2 MG/24HR TD PTWK: MEDICATED_PATCH | TRANSDERMAL | Status: DC

## 2013-03-12 NOTE — Progress Notes (Addendum)
   Subjective:    Patient ID: Donna Dyer, female    DOB: 1960-09-19, 53 y.o.   MRN: [id number]  HPI Diabetes - no hypoglycemic events. No wounds or sores that are not healing well. No increased thirst or urination. Checking glucose at home. Taking medications as prescribed without any side effects. Has been sick wit strep throat and hasn't been able to eat.    Hypertension- Pt denies chest pain, SOB, dizziness, or heart palpitations.  Taking meds as directed w/o problems.  Denies medication side effects.     Review of Systems     Objective:   Physical Exam  Constitutional: She is oriented to person, place, and time. She appears well-developed and well-nourished.  HENT:  Head: Normocephalic and atraumatic.  Cardiovascular: Normal rate, regular rhythm and normal heart sounds.   Pulmonary/Chest: Effort normal and breath sounds normal.  Neurological: She is alert and oriented to person, place, and time.  Skin: Skin is warm and dry.  Psychiatric: She has a normal mood and affect. Her behavior is normal.          Assessment & Plan:  IFG-- uncontrolled. Hemoglobin A1c is technically over 6 point today. We discussed working on diet and exercise to try to get this back under 6.5 so that we can avoid medication. She's tried metformin and localized in the past with side effects. We could certainly consider one of the newer agents such as Enbrel, or for C. difficile if her A1c is elevated at follow up in 3 months..  On ARB.   Lab Results  Component Value Date   HGBA1C 6.6 03/12/2013    HTN- not maximally controlled. Will increase the clonidine patch to 0.2 clonidine.  F/U in 3 mo.

## 2013-03-15 LAB — COMPLETE METABOLIC PANEL WITH GFR
ALK PHOS: 128 U/L — AB (ref 39–117)
ALT: 17 U/L (ref 0–35)
AST: 16 U/L (ref 0–37)
Albumin: 4.1 g/dL (ref 3.5–5.2)
BILIRUBIN TOTAL: 0.4 mg/dL (ref 0.2–1.2)
BUN: 11 mg/dL (ref 6–23)
CO2: 29 mEq/L (ref 19–32)
CREATININE: 0.86 mg/dL (ref 0.50–1.10)
Calcium: 9.4 mg/dL (ref 8.4–10.5)
Chloride: 101 mEq/L (ref 96–112)
GFR, EST AFRICAN AMERICAN: 89 mL/min
GFR, EST NON AFRICAN AMERICAN: 77 mL/min
GLUCOSE: 134 mg/dL — AB (ref 70–99)
Potassium: 4.1 mEq/L (ref 3.5–5.3)
Sodium: 141 mEq/L (ref 135–145)
Total Protein: 8.2 g/dL (ref 6.0–8.3)

## 2013-03-15 LAB — LIPID PANEL
CHOLESTEROL: 155 mg/dL (ref 0–200)
HDL: 33 mg/dL — AB (ref >39)
LDL Cholesterol: 79 mg/dL (ref 0–99)
TRIGLYCERIDES: 214 mg/dL — AB (ref <150)
Total CHOL/HDL Ratio: 4.7 Ratio
VLDL: 43 mg/dL — ABNORMAL HIGH (ref 0–40)

## 2013-03-19 ENCOUNTER — Other Ambulatory Visit: Payer: Self-pay | Admitting: *Deleted

## 2013-03-19 DIAGNOSIS — R748 Abnormal levels of other serum enzymes: Secondary | ICD-10-CM

## 2013-03-20 ENCOUNTER — Other Ambulatory Visit: Payer: Self-pay | Admitting: *Deleted

## 2013-03-20 ENCOUNTER — Telehealth: Payer: Self-pay | Admitting: *Deleted

## 2013-03-20 DIAGNOSIS — R748 Abnormal levels of other serum enzymes: Secondary | ICD-10-CM

## 2013-03-20 NOTE — Telephone Encounter (Signed)
Error

## 2013-03-27 ENCOUNTER — Telehealth: Payer: Self-pay | Admitting: Gynecologic Oncology

## 2013-03-27 LAB — CBC AND DIFFERENTIAL
HCT: 33 % — AB (ref 36–46)
Hemoglobin: 11 g/dL — AB (ref 12.0–16.0)
Platelets: 357 10*3/uL (ref 150–399)
WBC: 7.1 10^3/mL

## 2013-03-27 LAB — RHEUMATOID FACTOR

## 2013-03-27 LAB — POCT ERYTHROCYTE SEDIMENTATION RATE, NON-AUTOMATED: Sed Rate: 67 mm

## 2013-03-27 NOTE — Telephone Encounter (Signed)
Gyn-Oncology:  Office visit  Donna Dyer 53 y.o. female  CC: VAIN III   Assessment/Plan: Donna Dyer is a 53 y.o.  year old female with a history of VAIN III.  She underwent CO2 laser vaporization of the upper vagina on September 07, 2011.     HPI:  Donna Dyer is a 53 year old female referred to GYN Oncology by Dr. Gaetano Net for evaluation of VAIN-III on vagina biopsy.  Gravida 3 Para 2.  History includes a TAH, RSO in 1995 for endometriosis.  She has had abnormal pap tests for 2 years since 06/2009.  Pap test in 12/2010 and 07/2011 revealed LGSIL, with the pap in 07/2011 resulting HPV/ mild dysplasia/VAIN 1.  Colposcopy was performed on 08/03/11 with a vaginal biopsy resulting VAIN III.  On 09/07/11, she underwent CO2 laser vaporization of the upper vagina by Dr. Fermin Schwab.    Interval History:   Is doing well she denies any vaginal bleeding or discharge.  On 02/27/2012 she underwent a spinal procedure for management of back pain. Since that time she's had less strength in the right lower extremity in addition to complaints of numbness and coolness  Pap 03/2012 wnl   Review of Systems  Constitutional:  Feels normal  Genitourinary:  No frequency, urgency, dysuria, no vaginal bleeding.  Musculoskeletal:  No myalgia, R lower extremity instability.  Right lower extremity is cooler than the left pulses are equal bilaterally Neurologic:  No weakness, reports numbness from the buttocks to the toes of the right lower extremity, gait is unsteady.  Psychology:  No depression, anxiety, or insomnia.   Past Medical Hx:  Past Medical History  Diagnosis Date  . Hyperlipidemia   . Hypertension   . Anemia   . Endometriosis   . Pain, joint, hip, right     chronic- Dr Alvan Dame Dr Nelva Bush  . Edema     LLE- podiatry in [place]  . Diabetes mellitus   . Goiter   . Hemorrhoids    Family Hx:  Family History  Problem Relation Age of Onset  . Hyperlipidemia Mother   . Cancer Father     bladder  .  Heart attack Father     MI at age 72  . Diabetes Sister   . Diabetes Brother   . Diabetes Brother   . Diabetes Brother   . Diabetes Brother   . Sarcoidosis Brother   . Sarcoidosis Sister   . Anesthesia problems Neg Hx   . Hypotension Neg Hx   . Malignant hyperthermia Neg Hx   . Pseudochol deficiency Neg Hx    Vitals:  Blood pressure 142/84, pulse 64, temperature 98.2 F (36.8 C), resp. rate 16, height 5' 7.21" (1.707 m), weight 222 lb 12.8 oz (101.061 kg).  Physical Exam:  General:  Well developed, well nourished female in no acute distress.  Alert and oriented x 3.   Abdomen:  Normoactive bowel sounds, abdomen soft, non-tender and obese.  Back:  No CVA tenderness  Genitourinary:    Vulva/vagina: Normal external female genitalia. No lesions. No discharge or bleeding.    Vagina: Atrophic. Vagina healed with no visible lesion . No palpable vaginal or cul-de-sac masses Extremities:  : Coolness of the right lower extremity from approximately 3 inches below the knee in comparison to the contralateral lower extremity

## 2013-03-28 ENCOUNTER — Ambulatory Visit: Payer: Commercial Managed Care - PPO | Attending: Gynecologic Oncology | Admitting: Gynecologic Oncology

## 2013-03-28 VITALS — BP 149/79 | HR 78 | Temp 98.4°F | Resp 20 | Ht 67.21 in | Wt 219.6 lb

## 2013-03-28 DIAGNOSIS — R609 Edema, unspecified: Secondary | ICD-10-CM | POA: Insufficient documentation

## 2013-03-28 DIAGNOSIS — E049 Nontoxic goiter, unspecified: Secondary | ICD-10-CM | POA: Diagnosis not present

## 2013-03-28 DIAGNOSIS — D072 Carcinoma in situ of vagina: Secondary | ICD-10-CM

## 2013-03-28 DIAGNOSIS — I10 Essential (primary) hypertension: Secondary | ICD-10-CM | POA: Diagnosis not present

## 2013-03-28 DIAGNOSIS — E785 Hyperlipidemia, unspecified: Secondary | ICD-10-CM | POA: Diagnosis not present

## 2013-03-28 DIAGNOSIS — E119 Type 2 diabetes mellitus without complications: Secondary | ICD-10-CM | POA: Diagnosis not present

## 2013-03-28 DIAGNOSIS — G8929 Other chronic pain: Secondary | ICD-10-CM | POA: Insufficient documentation

## 2013-03-28 DIAGNOSIS — Z90710 Acquired absence of both cervix and uterus: Secondary | ICD-10-CM | POA: Insufficient documentation

## 2013-03-28 DIAGNOSIS — M25559 Pain in unspecified hip: Secondary | ICD-10-CM | POA: Diagnosis not present

## 2013-03-28 NOTE — Progress Notes (Signed)
Gyn-Oncology:  Office visit  Donna Dyer 53 y.o. female  CC: VAIN III   Assessment/Plan: Donna Dyer is a 53 y.o.  year old female with a history of VAIN III.  She underwent CO2 laser vaporization of the upper vagina on September 07, 2011.  Colposcopy performed today.  If VAIN 1 will f/u in 1 year. If VAIN2 or worse will recommend Co2 laser    HPI:  Donna Dyer is a 53 y.o.  female referred to GYN Oncology by Dr. Gaetano Net for evaluation of VAIN-III on vagina biopsy.  Gravida 3 Para 2.  History includes a TAH, RSO in 1995 for endometriosis.  She has had abnormal pap tests for 2 years since 06/2009.  Pap test in 12/2010 and 07/2011 revealed LGSIL, with the pap in 07/2011 resulting HPV/ mild dysplasia/VAIN 1.  Colposcopy was performed on 08/03/11 with a vaginal biopsy resulting VAIN III.  On 09/07/11, she underwent CO2 laser vaporization of the upper vagina by Dr. Fermin Schwab.    Pap 03/2012 wnl Pap 09/2012 LGSIL     Review of Systems  Constitutional:  Feels normal  Genitourinary:  No frequency, urgency, dysuria, no vaginal bleeding.  Musculoskeletal:  No myalgia, R lower extremity instability.  Right lower extremity is cooler than the left pulses are equal bilaterally   Past Medical Hx:  Past Medical History  Diagnosis Date  . Hyperlipidemia   . Hypertension   . Anemia   . Endometriosis   . Pain, joint, hip, right     chronic- Dr Alvan Dame Dr Nelva Bush  . Edema     LLE- podiatry in [place]  . Diabetes mellitus   . Goiter   . Hemorrhoids    Family Hx:  Family History  Problem Relation Age of Onset  . Hyperlipidemia Mother   . Cancer Father     bladder  . Heart attack Father     MI at age 63  . Diabetes Sister   . Diabetes Brother   . Diabetes Brother   . Diabetes Brother   . Diabetes Brother   . Sarcoidosis Brother   . Sarcoidosis Sister   . Anesthesia problems Neg Hx   . Hypotension Neg Hx   . Malignant hyperthermia Neg Hx   . Pseudochol deficiency Neg Hx    Vitals:   Blood pressure 149/79, pulse 78, temperature 98.4 F (36.9 C), temperature source Oral, resp. rate 20, height 5' 7.21" (1.707 m), weight 219 lb 9.6 oz (99.61 kg).  Physical Exam:  General:  Well developed, well nourished female in no acute distress.  Alert and oriented x 3.   HEENT Erythema of the left eye. Abdomen:  Normoactive bowel sounds, abdomen soft, non-tender and obese.  Back:  No CVA tenderness  Genitourinary:    Vulva/vagina: Normal external female genitalia. No lesions. No discharge or bleeding.    Vagina: Atrophic. Acetic acid applied and colposcopy performed.  WE noted at the left vaginal apex.  A bx was collected.

## 2013-03-28 NOTE — Patient Instructions (Signed)
We will contact you with the results of your biopsy results.  Please call for any questions or concerns.

## 2013-04-03 ENCOUNTER — Telehealth: Payer: Self-pay | Admitting: *Deleted

## 2013-04-03 DIAGNOSIS — R700 Elevated erythrocyte sedimentation rate: Secondary | ICD-10-CM

## 2013-04-03 NOTE — Telephone Encounter (Signed)
Please call pt and let her know: We will order an up-to-date chest x-ray. Also recommend repeat sedimentation rate in 2 weeks to see extending downwards.

## 2013-04-03 NOTE — Telephone Encounter (Signed)
Dr. Madilyn Fireman spoke w/ Dr. Posey Pronto regarding pt.Donna Dyer

## 2013-04-04 ENCOUNTER — Encounter: Payer: Self-pay | Admitting: *Deleted

## 2013-04-05 ENCOUNTER — Other Ambulatory Visit: Payer: Self-pay | Admitting: Gynecologic Oncology

## 2013-04-05 ENCOUNTER — Telehealth: Payer: Self-pay | Admitting: Gynecologic Oncology

## 2013-04-05 DIAGNOSIS — D072 Carcinoma in situ of vagina: Secondary | ICD-10-CM

## 2013-04-05 MED ORDER — ESTROGENS, CONJUGATED 0.625 MG/GM VA CREA: 1.0000 | TOPICAL_CREAM | VAGINAL | Status: DC

## 2013-04-05 NOTE — Telephone Encounter (Signed)
Pt advised to call the office so Dr. Leone Brand recommendations could be discussed with her.

## 2013-04-05 NOTE — Progress Notes (Signed)
Patient notified of biopsy results and Dr. Leone Brand recommendations for use of premarin vaginal cream.  She is advised to follow up with Dr. Gaetano Net.  She is to call for any questions or concerns.  She had been using an estrogen cream from a compounding pharmacy but she was asked to switch to premarin per Dr. Skeet Latch.

## 2013-04-05 NOTE — Telephone Encounter (Signed)
lvm informing pt of recommendations. .[name]  

## 2013-04-09 ENCOUNTER — Ambulatory Visit (INDEPENDENT_AMBULATORY_CARE_PROVIDER_SITE_OTHER): Payer: Commercial Managed Care - PPO

## 2013-04-09 DIAGNOSIS — R700 Elevated erythrocyte sedimentation rate: Secondary | ICD-10-CM

## 2013-04-10 ENCOUNTER — Other Ambulatory Visit: Payer: Self-pay | Admitting: *Deleted

## 2013-04-10 MED ORDER — NEBIVOLOL HCL 10 MG PO TABS: 20.0000 mg | ORAL_TABLET | Freq: Every day | ORAL | Status: DC

## 2013-04-20 ENCOUNTER — Encounter: Payer: Self-pay | Admitting: Family Medicine

## 2013-04-22 ENCOUNTER — Telehealth: Payer: Self-pay | Admitting: *Deleted

## 2013-04-22 NOTE — Telephone Encounter (Signed)
Faxed lab orders to Dr. Vernona Rieger ofc.Donna Dyer

## 2013-04-26 ENCOUNTER — Ambulatory Visit (HOSPITAL_BASED_OUTPATIENT_CLINIC_OR_DEPARTMENT_OTHER): Payer: Commercial Managed Care - PPO | Admitting: Hematology & Oncology

## 2013-04-26 ENCOUNTER — Ambulatory Visit (HOSPITAL_BASED_OUTPATIENT_CLINIC_OR_DEPARTMENT_OTHER): Payer: Commercial Managed Care - PPO | Admitting: Lab

## 2013-04-26 VITALS — BP 158/72 | HR 69 | Temp 98.3°F | Resp 12 | Ht 67.0 in | Wt 223.0 lb

## 2013-04-26 DIAGNOSIS — H20039 Secondary infectious iridocyclitis, unspecified eye: Secondary | ICD-10-CM

## 2013-04-26 DIAGNOSIS — D869 Sarcoidosis, unspecified: Secondary | ICD-10-CM

## 2013-04-26 DIAGNOSIS — D472 Monoclonal gammopathy: Secondary | ICD-10-CM

## 2013-04-26 DIAGNOSIS — D649 Anemia, unspecified: Secondary | ICD-10-CM

## 2013-04-26 DIAGNOSIS — A491 Streptococcal infection, unspecified site: Secondary | ICD-10-CM

## 2013-04-26 DIAGNOSIS — D509 Iron deficiency anemia, unspecified: Secondary | ICD-10-CM

## 2013-04-26 LAB — CMP (CANCER CENTER ONLY)
ALT(SGPT): 12 U/L (ref 10–47)
AST: 20 U/L (ref 11–38)
Albumin: 3.6 g/dL (ref 3.3–5.5)
Alkaline Phosphatase: 115 U/L — ABNORMAL HIGH (ref 26–84)
BUN, Bld: 16 mg/dL (ref 7–22)
CO2: 30 meq/L (ref 18–33)
Calcium: 9 mg/dL (ref 8.0–10.3)
Chloride: 101 meq/L (ref 98–108)
Creat: 0.8 mg/dL (ref 0.6–1.2)
Glucose, Bld: 114 mg/dL (ref 73–118)
Potassium: 3.9 meq/L (ref 3.3–4.7)
Sodium: 142 meq/L (ref 128–145)
Total Bilirubin: 0.8 mg/dL (ref 0.20–1.60)
Total Protein: 8.7 g/dL — ABNORMAL HIGH (ref 6.4–8.1)

## 2013-04-26 LAB — CBC WITH DIFFERENTIAL (CANCER CENTER ONLY)
BASO#: 0 10*3/uL (ref 0.0–0.2)
BASO%: 0.3 % (ref 0.0–2.0)
EOS%: 1.3 % (ref 0.0–7.0)
Eosinophils Absolute: 0.1 10*3/uL (ref 0.0–0.5)
HEMATOCRIT: 32.7 % — AB (ref 34.8–46.6)
HGB: 10.7 g/dL — ABNORMAL LOW (ref 11.6–15.9)
LYMPH#: 2.3 10*3/uL (ref 0.9–3.3)
LYMPH%: 32.1 % (ref 14.0–48.0)
MCH: 27.1 pg (ref 26.0–34.0)
MCHC: 32.7 g/dL (ref 32.0–36.0)
MCV: 83 fL (ref 81–101)
MONO#: 0.5 10*3/uL (ref 0.1–0.9)
MONO%: 6.7 % (ref 0.0–13.0)
NEUT#: 4.2 10*3/uL (ref 1.5–6.5)
NEUT%: 59.6 % (ref 39.6–80.0)
Platelets: 286 10*3/uL (ref 145–400)
RBC: 3.95 10*6/uL (ref 3.70–5.32)
RDW: 14.8 % (ref 11.1–15.7)
WBC: 7 10*3/uL (ref 3.9–10.0)

## 2013-04-26 NOTE — Progress Notes (Signed)
Hematology and Oncology Follow Up Visit  Donna Dyer [id number] [birth year] 53 y.o. 04/26/2013   Principle Diagnosis:  IgG kappa monoclonal gammopathy of undetermined significance. 2. Anemia - possible iron deficiency.  Current Therapy:    Observation     Interim History:  [name] is back for followup. Last her back in January. Since then, she's had problems with strep. She had a strep infection of the upper airway. She had iritis. She was tested for sarcoidosis. A chest x-ray was negative. Patient elevated sedimentation rate.  Is hard to say if all this is related to this monoclonal spike. We last saw her, her monoclonal spike was 1.3 g/dL which is holding steady.  She's had some joint issues. She has a polymyalgia.  She's had no rashes. She's had no weight loss or weight gain. Patient still working.  She is hypertensive. She is on quite a few hypertensive medications.  The been no bleeding. He's had no nausea vomiting..  Medications: Current outpatient prescriptions:Acetaminophen (TYLENOL EXTRA STRENGTH PO), Take 2 tablets by mouth as needed., Disp: , Rfl: ;  AMBULATORY NON FORMULARY MEDICATION, Medication Name: Glucometer, strips, and lancets. Check once a day. Dx: Impaired fasting glucose., Disp: 1 Units, Rfl: 12;  Calcium-Vitamin D-Vitamin K (VIACTIV PO), Take by mouth., Disp: , Rfl:  Cholecalciferol (VITAMIN D) 2000 UNITS CAPS, Take 2,000 Units by mouth daily. , Disp: , Rfl: ;  cloNIDine (CATAPRES - DOSED IN MG/24 HR) 0.2 mg/24hr patch, APPLY 1 PATCH ONTO THE SKIN ONCE A WEEK, Disp: 1 patch, Rfl: 0;  conjugated estrogens (PREMARIN) vaginal cream, Place 1 Applicatorful vaginally 2 (two) times a week. Please give patient an applicator., Disp: 42.5 g, Rfl: 12 fexofenadine (ALLEGRA) 180 MG tablet, Take 1 tablet (180 mg total) by mouth daily., Disp: 90 tablet, Rfl: 1;  losartan (COZAAR) 100 MG tablet, TAKE 1 TABLET DAILY, Disp: 90 tablet, Rfl: 0;  Multiple Vitamin (MULTIVITAMIN)  tablet, Take 1 tablet by mouth daily.  , Disp: , Rfl: ;  nebivolol (BYSTOLIC) 10 MG tablet, Take 2 tablets (20 mg total) by mouth daily., Disp: 180 tablet, Rfl: 1 verapamil (CALAN-SR) 180 MG CR tablet, Take 1 tablet (180 mg total) by mouth at bedtime., Disp: 7 tablet, Rfl: 0  Allergies:  Allergies  Allergen Reactions  . Pineapple Itching and Swelling    Tongue swells  . Onglyza [Saxagliptin] Itching  . Amlodipine Besylate Other (See Comments)    muscle cramps  . Azithromycin Itching and Rash  . Chlorthalidone Other (See Comments)    Muscle cramps  . Codeine Nausea And Vomiting  . Hctz [Hydrochlorothiazide] Other (See Comments)    Muscle spasms    . Metformin And Related Diarrhea  . Sulfonamide Derivatives Rash    Past Medical History, Surgical history, Social history, and Family History were reviewed and updated.  Review of Systems: As above  Physical Exam:  height is $RemoveB'5\' 7"'zRJxygWZ$  (1.702 m) and weight is 223 lb (101.152 kg). Her oral temperature is 98.3 F (36.8 C). Her blood pressure is 158/72 and her pulse is 69. Her respiration is 12.   Obese white female in no obvious distress. Head and neck exam shows no iritis. Pupils reacted properly. There is no oral lesion. Lungs are clear. Cardiac exam regular rate and rhythm with no murmurs rubs or bruits. Abdomen soft. She is obese. Good bowel sounds. There is no fluid wave. There is no palpable liver or spleen tip. Back exam no tenderness over the spine ribs or hips.  Extremities shows some 1+ edema in her lower legs. She has no joint swelling or erythema. Muscle strength is adequate. Strength is good. Skin exam no rashes ecchymoses or petechia. Neurological exam shows no focal neurological deficits.  Lab Results  Component Value Date   WBC 7.0 04/26/2013   HGB 10.7* 04/26/2013   HCT 32.7* 04/26/2013   MCV 83 04/26/2013   PLT 286 04/26/2013     Chemistry      Component Value Date/Time   NA 142 04/26/2013 0805   NA 141 03/15/2013 0847   K  3.9 04/26/2013 0805   K 4.1 03/15/2013 0847   CL 101 04/26/2013 0805   CL 101 03/15/2013 0847   CO2 30 04/26/2013 0805   CO2 29 03/15/2013 0847   BUN 16 04/26/2013 0805   BUN 11 03/15/2013 0847   CREATININE 0.8 04/26/2013 0805   CREATININE 0.85 09/21/2012 0917      Component Value Date/Time   CALCIUM 9.0 04/26/2013 0805   CALCIUM 9.4 03/15/2013 0847   ALKPHOS 115* 04/26/2013 0805   ALKPHOS 128* 03/15/2013 0847   AST 20 04/26/2013 0805   AST 16 03/15/2013 0847   ALT 12 04/26/2013 0805   ALT 17 03/15/2013 0847   BILITOT 0.80 04/26/2013 0805   BILITOT 0.4 03/15/2013 0847         Impression and Plan: Donna Dyer is 53 year old African American female. She has an IgG kappa monoclonal spike.  Again I am not sure if this is related to what is going on with his iritis.  We will see what the monoclonal spike shows.  Did send off a sedimentation rate on her. I sent off an ACE level on her.  We did do a bone survey on her last year.  If refunded she has a significant increase in her monoclonal spike, we may have to consider bone marrow biopsy on her.  It sounds like do something rheumatologic is going on.  I probably will get her back in another couple months. We will call her and see what her lab work shows.   [name], MD 4/17/20159:50 AM

## 2013-04-29 ENCOUNTER — Other Ambulatory Visit: Payer: Self-pay | Admitting: Family Medicine

## 2013-04-29 DIAGNOSIS — I10 Essential (primary) hypertension: Secondary | ICD-10-CM

## 2013-04-29 MED ORDER — CLONIDINE HCL 0.2 MG/24HR TD PTWK: MEDICATED_PATCH | TRANSDERMAL | Status: DC

## 2013-04-30 ENCOUNTER — Encounter: Payer: Self-pay | Admitting: Nurse Practitioner

## 2013-04-30 LAB — KAPPA/LAMBDA LIGHT CHAINS
KAPPA LAMBDA RATIO: 5.07 — AB (ref 0.26–1.65)
Kappa free light chain: 9.27 mg/dL — ABNORMAL HIGH (ref 0.33–1.94)
LAMBDA FREE LGHT CHN: 1.83 mg/dL (ref 0.57–2.63)

## 2013-04-30 LAB — ANGIOTENSIN CONVERTING ENZYME: ANGIOTENSIN 1 CE: 23 U/L (ref 8–52)

## 2013-04-30 LAB — PROTEIN ELECTROPHORESIS, SERUM, WITH REFLEX
ALPHA-2-GLOBULIN: 10.3 % (ref 7.1–11.8)
Albumin ELP: 47.3 % — ABNORMAL LOW (ref 55.8–66.1)
Alpha-1-Globulin: 4.3 % (ref 2.9–4.9)
Beta 2: 5.8 % (ref 3.2–6.5)
Beta Globulin: 6.2 % (ref 4.7–7.2)
GAMMA GLOBULIN: 26.1 % — AB (ref 11.1–18.8)
M-SPIKE, %: 1.38 g/dL
TOTAL PROTEIN, SERUM ELECTROPHOR: 7.9 g/dL (ref 6.0–8.3)

## 2013-04-30 LAB — IFE INTERPRETATION

## 2013-04-30 LAB — IGG, IGA, IGM
IGM, SERUM: 82 mg/dL (ref 52–322)
IgA: 354 mg/dL (ref 69–380)
IgG (Immunoglobin G), Serum: 2030 mg/dL — ABNORMAL HIGH (ref 690–1700)

## 2013-04-30 LAB — SEDIMENTATION RATE: Sed Rate: 53 mm/hr — ABNORMAL HIGH (ref 0–22)

## 2013-05-08 ENCOUNTER — Encounter: Payer: Self-pay | Admitting: Family Medicine

## 2013-05-14 ENCOUNTER — Other Ambulatory Visit: Payer: Self-pay | Admitting: Family Medicine

## 2013-05-14 ENCOUNTER — Other Ambulatory Visit: Payer: Self-pay | Admitting: *Deleted

## 2013-05-14 DIAGNOSIS — I10 Essential (primary) hypertension: Secondary | ICD-10-CM

## 2013-05-14 MED ORDER — CLONIDINE HCL 0.2 MG/24HR TD PTWK: MEDICATED_PATCH | TRANSDERMAL | Status: DC

## 2013-05-15 ENCOUNTER — Other Ambulatory Visit: Payer: Self-pay | Admitting: *Deleted

## 2013-05-15 DIAGNOSIS — I10 Essential (primary) hypertension: Secondary | ICD-10-CM

## 2013-05-16 ENCOUNTER — Other Ambulatory Visit: Payer: Self-pay | Admitting: Family Medicine

## 2013-05-27 ENCOUNTER — Ambulatory Visit (INDEPENDENT_AMBULATORY_CARE_PROVIDER_SITE_OTHER): Payer: Commercial Managed Care - PPO | Admitting: Family Medicine

## 2013-05-27 ENCOUNTER — Encounter: Payer: Self-pay | Admitting: Family Medicine

## 2013-05-27 VITALS — BP 134/69 | HR 66 | Ht 67.0 in | Wt 225.0 lb

## 2013-05-27 DIAGNOSIS — I10 Essential (primary) hypertension: Secondary | ICD-10-CM

## 2013-05-27 MED ORDER — CLONIDINE HCL 0.2 MG/24HR TD PTWK: MEDICATED_PATCH | TRANSDERMAL | Status: DC

## 2013-05-27 NOTE — Progress Notes (Signed)
   Subjective:    Patient ID: Donna Dyer, female    DOB: 11-14-1960, 53 y.o.   MRN: [id number]  HPI  Hypertension- Pt denies chest pain, SOB, dizziness, or heart palpitations.  Taking meds as directed w/o problems.  Denies medication side effects.    She also had reviewed and discussed her blood work that was from Dr. Marin Dyer.    Review of Systems     Objective:   Physical Exam  Constitutional: She is oriented to person, place, and time. She appears well-developed and well-nourished.  HENT:  Head: Normocephalic and atraumatic.  Cardiovascular: Normal rate, regular rhythm and normal heart sounds.   Pulmonary/Chest: Effort normal and breath sounds normal.  Neurological: She is alert and oriented to person, place, and time.  Skin: Skin is warm and dry.  Psychiatric: She has a normal mood and affect. Her behavior is normal.          Assessment & Plan:  HTN- wEll controlled on current regimen. Now on the 0.2 mg clonidine patch. Will sent to mail or so she's tolerating it well. Followup in 6 months.  Reviewed her labs.

## 2013-06-05 ENCOUNTER — Other Ambulatory Visit: Payer: Self-pay | Admitting: Physician Assistant

## 2013-06-11 ENCOUNTER — Ambulatory Visit: Payer: Commercial Managed Care - PPO | Admitting: Family Medicine

## 2013-07-22 ENCOUNTER — Other Ambulatory Visit: Payer: Self-pay | Admitting: Family Medicine

## 2013-08-12 ENCOUNTER — Other Ambulatory Visit: Payer: Self-pay | Admitting: Family Medicine

## 2013-09-10 ENCOUNTER — Encounter: Payer: Self-pay | Admitting: Family Medicine

## 2013-09-10 ENCOUNTER — Ambulatory Visit (INDEPENDENT_AMBULATORY_CARE_PROVIDER_SITE_OTHER): Payer: Commercial Managed Care - PPO | Admitting: Family Medicine

## 2013-09-10 VITALS — BP 143/74 | HR 78 | Wt 226.0 lb

## 2013-09-10 DIAGNOSIS — E1165 Type 2 diabetes mellitus with hyperglycemia: Secondary | ICD-10-CM

## 2013-09-10 DIAGNOSIS — M25579 Pain in unspecified ankle and joints of unspecified foot: Secondary | ICD-10-CM

## 2013-09-10 DIAGNOSIS — IMO0001 Reserved for inherently not codable concepts without codable children: Secondary | ICD-10-CM

## 2013-09-10 DIAGNOSIS — M7989 Other specified soft tissue disorders: Secondary | ICD-10-CM

## 2013-09-10 DIAGNOSIS — M25572 Pain in left ankle and joints of left foot: Secondary | ICD-10-CM

## 2013-09-10 DIAGNOSIS — R252 Cramp and spasm: Secondary | ICD-10-CM

## 2013-09-10 DIAGNOSIS — R7301 Impaired fasting glucose: Secondary | ICD-10-CM

## 2013-09-10 LAB — POCT GLYCOSYLATED HEMOGLOBIN (HGB A1C): Hemoglobin A1C: 6.6

## 2013-09-10 MED ORDER — SPIRONOLACTONE 25 MG PO TABS: 25.0000 mg | ORAL_TABLET | Freq: Every day | ORAL | Status: DC

## 2013-09-10 NOTE — Progress Notes (Signed)
   Subjective:    Patient ID: Donna Dyer, female    DOB: 1960/07/22, 53 y.o.   MRN: [id number]  HPI Here for LE swelling. Worse on the left.  Says gets better when elevated but doesn't go completely away on the left ankle. Saw her rheumatologist who saw not related to joints.  Did see her podiatrist.  Hasn't been wearing her stocking this summer.  No CP or SOB with it.  Has beeen worse over the summer.   Cramping in her feet.  Not in hands.  Happening more frequently.  Has ben worse since had " shot in her back".  Having some pain in her 3rd teo in her left foot as well. Had xrays that were normal. Gets frequent cramping  Diabetes - here for followup in one A1c. She was able to get down for a while with diet and exercise. It was elevated mildly in March. She has been intolerant to metformin and onglyza.   Review of Systems     Objective:   Physical Exam  Constitutional: She appears well-developed and well-nourished.  HENT:  Head: Normocephalic and atraumatic.  Musculoskeletal:  1+ pitting edema on the left ankle, trace edema on the right ankle.  Skin: Skin is warm.  Psychiatric: She has a normal mood and affect. Her behavior is normal.          Assessment & Plan:  Lower extremity swelling-most likely secondary to venous stasis. Recommendation is for compression stockings and start wearing this again. She's been intolerant to hydrochlorothiazide chlorthalidone in the past as it tends to trigger muscle cramping for her. We will try smaller tone instead. She would also like to be checked for any insufficiency such as B12 and magnesium.  Diabetes-her A1c is back up to 6.6 today. I like to give her some samples of Januvia to try. If she experiences itching with it then stop immediately and call me back and will try something else.  Cramping- will check for deficiencies.  She plans to try magnesium and B12 supplement.

## 2013-09-11 LAB — URIC ACID: URIC ACID, SERUM: 7 mg/dL (ref 2.4–7.0)

## 2013-09-11 LAB — FOLATE: Folate: >20 ng/mL

## 2013-09-11 LAB — VITAMIN B12: Vitamin B-12: 604 pg/mL (ref 211–911)

## 2013-09-11 LAB — VITAMIN D 25 HYDROXY (VIT D DEFICIENCY, FRACTURES): Vit D, 25-Hydroxy: 38 ng/mL (ref 30–89)

## 2013-09-11 LAB — MAGNESIUM: MAGNESIUM: 2.1 mg/dL (ref 1.5–2.5)

## 2013-09-26 ENCOUNTER — Encounter: Payer: Self-pay | Admitting: Family Medicine

## 2013-09-30 ENCOUNTER — Other Ambulatory Visit: Payer: Self-pay | Admitting: Obstetrics and Gynecology

## 2013-10-01 LAB — CYTOLOGY - PAP

## 2013-10-15 ENCOUNTER — Encounter: Payer: Self-pay | Admitting: Family Medicine

## 2013-10-16 ENCOUNTER — Other Ambulatory Visit: Payer: Self-pay | Admitting: Family Medicine

## 2013-10-16 DIAGNOSIS — Z79899 Other long term (current) drug therapy: Secondary | ICD-10-CM

## 2013-10-16 MED ORDER — SITAGLIPTIN PHOSPHATE 100 MG PO TABS: 100.0000 mg | ORAL_TABLET | Freq: Every day | ORAL | Status: DC

## 2013-10-31 ENCOUNTER — Telehealth: Payer: Self-pay | Admitting: *Deleted

## 2013-10-31 NOTE — Telephone Encounter (Signed)
[facility] lvm informing that they only have Bystolic 10 mg in stock wanted to know if it would be ok to make change to 10 mg tabs. Called back had to lvm informing ok to change to 10 mg x 2 tabs. CLE#[id number].Donna Dyer

## 2013-11-01 ENCOUNTER — Other Ambulatory Visit: Payer: Self-pay | Admitting: *Deleted

## 2013-11-01 MED ORDER — NEBIVOLOL HCL 10 MG PO TABS: 20.0000 mg | ORAL_TABLET | Freq: Every day | ORAL | Status: DC

## 2013-11-29 ENCOUNTER — Encounter: Payer: Self-pay | Admitting: Family Medicine

## 2013-11-29 ENCOUNTER — Ambulatory Visit (INDEPENDENT_AMBULATORY_CARE_PROVIDER_SITE_OTHER): Payer: Commercial Managed Care - PPO | Admitting: Family Medicine

## 2013-11-29 VITALS — BP 138/88 | HR 81 | Wt 226.0 lb

## 2013-11-29 DIAGNOSIS — I10 Essential (primary) hypertension: Secondary | ICD-10-CM | POA: Diagnosis not present

## 2013-11-29 DIAGNOSIS — I878 Other specified disorders of veins: Secondary | ICD-10-CM

## 2013-11-29 DIAGNOSIS — E119 Type 2 diabetes mellitus without complications: Secondary | ICD-10-CM | POA: Diagnosis not present

## 2013-11-29 DIAGNOSIS — J302 Other seasonal allergic rhinitis: Secondary | ICD-10-CM | POA: Insufficient documentation

## 2013-11-29 MED ORDER — CLONIDINE HCL 0.2 MG/24HR TD PTWK: MEDICATED_PATCH | TRANSDERMAL | Status: DC

## 2013-11-29 MED ORDER — AZILSARTAN MEDOXOMIL 40 MG PO TABS: 1.0000 | ORAL_TABLET | Freq: Every day | ORAL | Status: DC

## 2013-11-29 MED ORDER — VERAPAMIL HCL ER 180 MG PO TBCR: 180.0000 mg | EXTENDED_RELEASE_TABLET | Freq: Every day | ORAL | Status: DC

## 2013-11-29 NOTE — Progress Notes (Signed)
   Subjective:    Patient ID: Donna Dyer, female    DOB: [birth year], 53 y.o.   MRN: [id number]  HPI Hypertension- Pt denies chest pain, SOB, dizziness, or heart palpitations.  Taking meds as directed w/o problems.  We decided to start her on spironolactone to see if this will help with her lower extremity edema as well as help control her blood pressure. She says it causes significant cramping so right now she is just using it as needed. She did try the compression stockings for the lower extremity edema as well. She says unfortunately if she wears them too long it starts to cut her off at her ankles.  On clonidine, losartan, bystolic,  and verapamil.  Has stopped her exercise routine    Diabetes - doing well on the Januvia. Has had more joint pain than usually.  She's not sure if it's from the medication or not and she's also had some upper respiratory symptoms.  Has had cough, sneezing, nasal drainage.  No fever, chills.  Using her flonase and allegra. She's had a lot of itching in her ears as well. She does feel little bit better the last few days compared to last week.   Review of Systems     Objective:   Physical Exam  Constitutional: She is oriented to person, place, and time. She appears well-developed and well-nourished.  HENT:  Head: Normocephalic and atraumatic.  Right Ear: External ear normal.  Left Ear: External ear normal.  Nose: Nose normal.  Mouth/Throat: Oropharynx is clear and moist.  TMs and canals are clear.   Eyes: Conjunctivae and EOM are normal. Pupils are equal, round, and reactive to light.  Neck: Neck supple. No thyromegaly present.  Cardiovascular: Normal rate, regular rhythm and normal heart sounds.   Pulmonary/Chest: Effort normal and breath sounds normal. She has no wheezes.  Lymphadenopathy:    She has no cervical adenopathy.  Neurological: She is alert and oriented to person, place, and time.  Skin: Skin is warm and dry.  Psychiatric: She has a  normal mood and affect.          Assessment & Plan:  HTN- Uncontrolled.  Will switch losartan to eat [place] 40 mg. Samples provided for 4/2 weeks that she can try it. If it works well then we can send a prescription to [facility] [place]   DM- says far doing okay on the [place]. I'm not sure if the joint pain that she's experiencing is related to weather change and decreased activity. She's no longer exercising. She was previously swimming regularly. Encouraged her to try to get back into some type of exercise routine. But we could certainly consider holding the Januvia for couple of weeks if she feels like her symptoms persist.  AR- continue with Flonase and Allegra. She is feeling a little bit better. If she suddenly feels worse or continues to not improve and please call the office back.  Venous stasis-wearing compression stockings some but not regularly. She does not have them on today. Consider prescription for custom fit since she is having problems with the stockings cutting off her circulation on her ankles.

## 2013-11-29 NOTE — Patient Instructions (Signed)
Take the Edarbi once a day in place of the losartan.

## 2013-11-30 LAB — BASIC METABOLIC PANEL WITH GFR
BUN: 15 mg/dL (ref 6–23)
CO2: 25 mEq/L (ref 19–32)
Calcium: 9.3 mg/dL (ref 8.4–10.5)
Chloride: 99 mEq/L (ref 96–112)
Creat: 0.9 mg/dL (ref 0.50–1.10)
GFR, EST AFRICAN AMERICAN: 83 mL/min
GFR, Est Non African American: 72 mL/min
Glucose, Bld: 83 mg/dL (ref 70–99)
POTASSIUM: 4.1 meq/L (ref 3.5–5.3)
SODIUM: 137 meq/L (ref 135–145)

## 2013-12-02 ENCOUNTER — Encounter: Payer: Self-pay | Admitting: Family Medicine

## 2013-12-02 ENCOUNTER — Encounter: Payer: Self-pay | Admitting: *Deleted

## 2013-12-02 NOTE — Progress Notes (Signed)
Quick Note:  All labs are normal. ______ 

## 2013-12-17 ENCOUNTER — Other Ambulatory Visit: Payer: Self-pay | Admitting: Nurse Practitioner

## 2013-12-17 DIAGNOSIS — D509 Iron deficiency anemia, unspecified: Secondary | ICD-10-CM

## 2013-12-17 DIAGNOSIS — D472 Monoclonal gammopathy: Secondary | ICD-10-CM

## 2013-12-18 ENCOUNTER — Encounter: Payer: Self-pay | Admitting: Hematology & Oncology

## 2013-12-18 ENCOUNTER — Ambulatory Visit (HOSPITAL_BASED_OUTPATIENT_CLINIC_OR_DEPARTMENT_OTHER): Payer: Commercial Managed Care - PPO | Admitting: Hematology & Oncology

## 2013-12-18 ENCOUNTER — Ambulatory Visit (HOSPITAL_BASED_OUTPATIENT_CLINIC_OR_DEPARTMENT_OTHER): Payer: Commercial Managed Care - PPO | Admitting: Lab

## 2013-12-18 VITALS — BP 153/73 | HR 69 | Temp 98.0°F | Resp 16 | Ht 67.0 in | Wt 229.0 lb

## 2013-12-18 DIAGNOSIS — D649 Anemia, unspecified: Secondary | ICD-10-CM

## 2013-12-18 DIAGNOSIS — D472 Monoclonal gammopathy: Secondary | ICD-10-CM

## 2013-12-18 DIAGNOSIS — D509 Iron deficiency anemia, unspecified: Secondary | ICD-10-CM

## 2013-12-18 DIAGNOSIS — D500 Iron deficiency anemia secondary to blood loss (chronic): Secondary | ICD-10-CM

## 2013-12-18 LAB — IRON AND TIBC CHCC
%SAT: 14 % — AB (ref 21–57)
IRON: 44 ug/dL (ref 41–142)
TIBC: 313 ug/dL (ref 236–444)
UIBC: 269 ug/dL (ref 120–384)

## 2013-12-18 LAB — FERRITIN CHCC: Ferritin: 120 ng/ml (ref 9–269)

## 2013-12-18 LAB — CBC WITH DIFFERENTIAL (CANCER CENTER ONLY)
BASO#: 0 10*3/uL (ref 0.0–0.2)
BASO%: 0.4 % (ref 0.0–2.0)
EOS%: 1.5 % (ref 0.0–7.0)
Eosinophils Absolute: 0.1 10*3/uL (ref 0.0–0.5)
HEMATOCRIT: 34.6 % — AB (ref 34.8–46.6)
HEMOGLOBIN: 11.4 g/dL — AB (ref 11.6–15.9)
LYMPH#: 2.4 10*3/uL (ref 0.9–3.3)
LYMPH%: 28.6 % (ref 14.0–48.0)
MCH: 28.1 pg (ref 26.0–34.0)
MCHC: 32.9 g/dL (ref 32.0–36.0)
MCV: 85 fL (ref 81–101)
MONO#: 0.7 10*3/uL (ref 0.1–0.9)
MONO%: 7.7 % (ref 0.0–13.0)
NEUT#: 5.2 10*3/uL (ref 1.5–6.5)
NEUT%: 61.8 % (ref 39.6–80.0)
PLATELETS: 305 10*3/uL (ref 145–400)
RBC: 4.06 10*6/uL (ref 3.70–5.32)
RDW: 13.7 % (ref 11.1–15.7)
WBC: 8.4 10*3/uL (ref 3.9–10.0)

## 2013-12-20 ENCOUNTER — Telehealth: Payer: Self-pay

## 2013-12-20 LAB — PROTEIN ELECTROPHORESIS, SERUM, WITH REFLEX
Albumin ELP: 48.8 % — ABNORMAL LOW (ref 55.8–66.1)
Alpha-1-Globulin: 4 % (ref 2.9–4.9)
Alpha-2-Globulin: 10 % (ref 7.1–11.8)
Beta 2: 6.1 % (ref 3.2–6.5)
Beta Globulin: 6 % (ref 4.7–7.2)
GAMMA GLOBULIN: 25.1 % — AB (ref 11.1–18.8)
M-SPIKE, %: 1.34 g/dL
TOTAL PROTEIN, SERUM ELECTROPHOR: 8.2 g/dL (ref 6.0–8.3)

## 2013-12-20 LAB — IGG, IGA, IGM
IGA: 289 mg/dL (ref 69–380)
IGG (IMMUNOGLOBIN G), SERUM: 2300 mg/dL — AB (ref 690–1700)
IgM, Serum: 102 mg/dL (ref 52–322)

## 2013-12-20 LAB — COMPREHENSIVE METABOLIC PANEL
ALT: 16 U/L (ref 0–35)
AST: 15 U/L (ref 0–37)
Albumin: 4.1 g/dL (ref 3.5–5.2)
Alkaline Phosphatase: 120 U/L — ABNORMAL HIGH (ref 39–117)
BILIRUBIN TOTAL: 0.5 mg/dL (ref 0.2–1.2)
BUN: 14 mg/dL (ref 6–23)
CO2: 27 mEq/L (ref 19–32)
CREATININE: 0.86 mg/dL (ref 0.50–1.10)
Calcium: 9.2 mg/dL (ref 8.4–10.5)
Chloride: 101 mEq/L (ref 96–112)
Glucose, Bld: 121 mg/dL — ABNORMAL HIGH (ref 70–99)
Potassium: 4.1 mEq/L (ref 3.5–5.3)
SODIUM: 137 meq/L (ref 135–145)
TOTAL PROTEIN: 8.2 g/dL (ref 6.0–8.3)

## 2013-12-20 LAB — KAPPA/LAMBDA LIGHT CHAINS
Kappa free light chain: 7.24 mg/dL — ABNORMAL HIGH (ref 0.33–1.94)
Kappa:Lambda Ratio: 5.61 — ABNORMAL HIGH (ref 0.26–1.65)
LAMBDA FREE LGHT CHN: 1.29 mg/dL (ref 0.57–2.63)

## 2013-12-20 LAB — LACTATE DEHYDROGENASE: LDH: 149 U/L (ref 94–250)

## 2013-12-20 LAB — IFE INTERPRETATION

## 2013-12-20 NOTE — Telephone Encounter (Signed)
Donna Dyer called and left a message stating she was due to come in for a blood pressure check. She wanted to know if you could look at her vitals from her visit with Donna Dyer on 12/18/2013.  BP Pulse Temp(Src) Resp Ht Wt    153/73 mmHg 69 98 F (36.7 C) (Oral) 16 5\' 7"  (1.702 m) 229 lb (103.874 kg)

## 2013-12-20 NOTE — Telephone Encounter (Signed)
It looked great here in our office about 3 weeks ago. Is she checking her BP at home? What is she getting at home. If not I encourage her to get her own cuff for home.  Arm cuff is better than the wrist cuffs. Does she snore?

## 2013-12-20 NOTE — Progress Notes (Signed)
Hematology and Oncology Follow Up Visit  Donna Dyer [id number] 08-31-1960 53 y.o. 12/20/2013   Principle Diagnosis:  IgG kappa monoclonal gammopathy of undetermined significance. 2. Anemia - possible iron deficiency.  Current Therapy:    Observation     Interim History:  [name] is back for followup. We last her back in April. She had a good summer. She did some traveling.  She is busy at work.  She's not having any problems with bone or joint pain.  We saw her back in April, her monoclonal spike was 1.38 g/L. Her IgG level was 2030 mg/dL. Her kappa light chain was 9.27 mg/dL.  She's not had any leg swelling. She's had no rashes. She's had no change in bowel or bladder habits. There's been no fever. She's had no cough.   Medications: Current outpatient prescriptions: Acetaminophen (TYLENOL EXTRA STRENGTH PO), Take 2 tablets by mouth as needed., Disp: , Rfl: ;  AMBULATORY NON FORMULARY MEDICATION, Medication Name: Glucometer, strips, and lancets. Check once a day. Dx: Impaired fasting glucose., Disp: 1 Units, Rfl: 12;  Azilsartan Medoxomil (EDARBI) 40 MG TABS, Take 1 tablet by mouth daily., Disp: 35 tablet, Rfl: 0 Calcium-Vitamin D-Vitamin K (VIACTIV PO), Take by mouth., Disp: , Rfl: ;  Cholecalciferol (VITAMIN D) 2000 UNITS CAPS, Take 2,000 Units by mouth daily. , Disp: , Rfl: ;  cloNIDine (CATAPRES - DOSED IN MG/24 HR) 0.2 mg/24hr patch, APPLY 1 PATCH ONTO THE SKIN ONCE A WEEK, Disp: 12 patch, Rfl: 3;  fexofenadine (ALLEGRA) 180 MG tablet, Take 1 tablet (180 mg total) by mouth daily., Disp: 90 tablet, Rfl: 1 Multiple Vitamin (MULTIVITAMIN) tablet, Take 1 tablet by mouth daily.  , Disp: , Rfl: ;  nebivolol (BYSTOLIC) 10 MG tablet, Take 2 tablets (20 mg total) by mouth daily., Disp: 180 tablet, Rfl: 0;  sitaGLIPtin (JANUVIA) 100 MG tablet, Take 1 tablet (100 mg total) by mouth daily., Disp: 30 tablet, Rfl: 6;  spironolactone (ALDACTONE) 25 MG tablet, Take 1 tablet (25 mg total)  by mouth daily., Disp: 30 tablet, Rfl: 1 verapamil (CALAN-SR) 180 MG CR tablet, Take 1 tablet (180 mg total) by mouth at bedtime., Disp: 90 tablet, Rfl: 3  Allergies:  Allergies  Allergen Reactions  . Pineapple Itching and Swelling    Tongue swells  . Onglyza [Saxagliptin] Itching  . Amlodipine Besylate Other (See Comments)    muscle cramps  . Azithromycin Itching and Rash  . Chlorthalidone Other (See Comments)    Muscle cramps  . Codeine Nausea And Vomiting  . Hctz [Hydrochlorothiazide] Other (See Comments)    Muscle spasms    . Metformin And Related Diarrhea  . Sulfonamide Derivatives Rash    Past Medical History, Surgical history, Social history, and Family History were reviewed and updated.  Review of Systems: As above  Physical Exam:  height is 5\' 7"  (1.702 m) and weight is 229 lb (103.874 kg). Her oral temperature is 98 F (36.7 C). Her blood pressure is 153/73 and her pulse is 69. Her respiration is 16.   Obese white female in no obvious distress. Head and neck exam shows no iritis. Pupils reacted properly. There is no oral lesion. Lungs are clear. Cardiac exam regular rate and rhythm with no murmurs rubs or bruits. Abdomen soft. She is obese. Good bowel sounds. There is no fluid wave. There is no palpable liver or spleen tip. Back exam no tenderness over the spine ribs or hips. Extremities shows some 1+ edema in her lower  legs. She has no joint swelling or erythema. Muscle strength is adequate. Strength is good. Skin exam no rashes ecchymoses or petechia. Neurological exam shows no focal neurological deficits.  Lab Results  Component Value Date   WBC 8.4 12/18/2013   HGB 11.4* 12/18/2013   HCT 34.6* 12/18/2013   MCV 85 12/18/2013   PLT 305 12/18/2013     Chemistry      Component Value Date/Time   NA 137 12/18/2013 0800   NA 142 04/26/2013 0805   K 4.1 12/18/2013 0800   K 3.9 04/26/2013 0805   CL 101 12/18/2013 0800   CL 101 04/26/2013 0805   CO2 27 12/18/2013  0800   CO2 30 04/26/2013 0805   BUN 14 12/18/2013 0800   BUN 16 04/26/2013 0805   CREATININE 0.86 12/18/2013 0800   CREATININE 0.90 11/29/2013 1150      Component Value Date/Time   CALCIUM 9.2 12/18/2013 0800   CALCIUM 9.0 04/26/2013 0805   ALKPHOS 120* 12/18/2013 0800   ALKPHOS 115* 04/26/2013 0805   AST 15 12/18/2013 0800   AST 20 04/26/2013 0805   ALT 16 12/18/2013 0800   ALT 12 04/26/2013 0805   BILITOT 0.5 12/18/2013 0800   BILITOT 0.80 04/26/2013 0805     IgG level is 2300 mg/dL. Her monoclonal spike is 1.34 g/L. Her kappa light chain 7.24 mg/dL.  Her ferritin is 120. Iron saturation is 14%. Total iron is 44.    Impression and Plan: Donna Dyer is 53 year old African American female. She has an IgG kappa monoclonal spike. I think this is an MGUS.  So far, everything is looking pretty stable. As such, I don't think we have to put her through a bone marrow test.  I think we are probably get her back in 6 months. I think we should probably get a bone survey on her.  Her iron is borderline. We might want to consider some IV iron on her.  I spent about 30 minutes with her.   [name], MD 12/11/20154:24 PM

## 2013-12-23 ENCOUNTER — Telehealth: Payer: Self-pay | Admitting: *Deleted

## 2013-12-23 NOTE — Telephone Encounter (Addendum)
Left message. Told pt to call if they had any questions.   ----- Message from [name], MD sent at 12/21/2013  8:14 AM EST ----- Call - protein studies are still holding stable!!  Donna Dyer

## 2013-12-24 NOTE — Telephone Encounter (Signed)
Left message for patient to return call.

## 2013-12-27 ENCOUNTER — Encounter: Payer: Self-pay | Admitting: Family Medicine

## 2013-12-30 ENCOUNTER — Other Ambulatory Visit: Payer: Self-pay

## 2013-12-30 MED ORDER — AMBULATORY NON FORMULARY MEDICATION: Status: DC

## 2013-12-30 NOTE — Telephone Encounter (Signed)
Sent message through mychart

## 2013-12-31 ENCOUNTER — Ambulatory Visit (INDEPENDENT_AMBULATORY_CARE_PROVIDER_SITE_OTHER): Payer: Commercial Managed Care - PPO | Admitting: Family Medicine

## 2013-12-31 VITALS — BP 154/80 | HR 79

## 2013-12-31 DIAGNOSIS — I10 Essential (primary) hypertension: Secondary | ICD-10-CM | POA: Diagnosis not present

## 2013-12-31 MED ORDER — NEBIVOLOL HCL 10 MG PO TABS: 20.0000 mg | ORAL_TABLET | Freq: Every day | ORAL | Status: DC

## 2013-12-31 NOTE — Progress Notes (Signed)
   Subjective:    Patient ID: Donna Dyer, female    DOB: November 19, 1960, 53 y.o.   MRN: [id number]  HPI  Hypertension - [name] is here for blood pressure check. Denies chest pain, shortness of breath or headaches. We swithced the losartan to edarbi   Review of Systems     Objective:   Physical Exam        Assessment & Plan:  Hypertension - still elevated today. We will increase her Edarbi 80 mg. Samples provided. Follow-up in 2-3 weeks for blood pressure check.  Beatrice Lecher, MD

## 2014-01-13 ENCOUNTER — Ambulatory Visit: Payer: Commercial Managed Care - PPO | Admitting: Family Medicine

## 2014-01-16 ENCOUNTER — Encounter: Payer: Self-pay | Admitting: Family Medicine

## 2014-01-16 ENCOUNTER — Telehealth: Payer: Self-pay | Admitting: *Deleted

## 2014-01-16 ENCOUNTER — Ambulatory Visit (INDEPENDENT_AMBULATORY_CARE_PROVIDER_SITE_OTHER): Payer: Commercial Managed Care - PPO | Admitting: Family Medicine

## 2014-01-16 VITALS — BP 122/82 | HR 74 | Ht 67.0 in | Wt 231.0 lb

## 2014-01-16 DIAGNOSIS — Z23 Encounter for immunization: Secondary | ICD-10-CM

## 2014-01-16 DIAGNOSIS — I10 Essential (primary) hypertension: Secondary | ICD-10-CM

## 2014-01-16 DIAGNOSIS — E119 Type 2 diabetes mellitus without complications: Secondary | ICD-10-CM

## 2014-01-16 DIAGNOSIS — R040 Epistaxis: Secondary | ICD-10-CM | POA: Diagnosis not present

## 2014-01-16 DIAGNOSIS — J3489 Other specified disorders of nose and nasal sinuses: Secondary | ICD-10-CM

## 2014-01-16 LAB — POCT UA - MICROALBUMIN
Albumin/Creatinine Ratio, Urine, POC: <30
Creatinine, POC: 200 mg/dL
Microalbumin Ur, POC: 30 mg/L

## 2014-01-16 LAB — POCT GLYCOSYLATED HEMOGLOBIN (HGB A1C): Hemoglobin A1C: 6.5

## 2014-01-16 MED ORDER — AZILSARTAN MEDOXOMIL 80 MG PO TABS: 80.0000 mg | ORAL_TABLET | Freq: Every day | ORAL | Status: DC

## 2014-01-16 MED ORDER — MUPIROCIN 2 % EX OINT: TOPICAL_OINTMENT | CUTANEOUS | Status: DC

## 2014-01-16 MED ORDER — DULAGLUTIDE 0.75 MG/0.5ML ~~LOC~~ SOAJ: 0.7500 mg | SUBCUTANEOUS | Status: DC

## 2014-01-16 NOTE — Progress Notes (Signed)
   Subjective:    Patient ID: Donna Dyer, female    DOB: 01-29-1960, 54 y.o.   MRN: [id number]  HPI Diabetes - no hypoglycemic events. No wounds or sores that are not healing well. No increased thirst or urination. Checking glucose at home. Taking medications as prescribed without any side effects.  Hypertension- Pt denies chest pain, SOB, dizziness, or heart palpitations.  Taking meds as directed w/o problems.  Denies medication side effects.  We saw her in the office about 2 weeks ago because she was still having elevated blood pressures. We increased her Edarbi from 40 mg to 80 mg. She's been taking samples and doing well with it.  She also wants me to take a look at her right nostril. She's been noticing some intermittent small amount of bleeding and then blowing scabs out of the nose. She says it's been bothersome when she wears her CPAP at night. She feels like the left is fine. She's also tried some nasal saline rinse but feels like it gets blocked and is not able to go back very far.  Review of Systems     Objective:   Physical Exam  Constitutional: She is oriented to person, place, and time. She appears well-developed and well-nourished.  HENT:  Head: Normocephalic and atraumatic.  Cardiovascular: Normal rate, regular rhythm and normal heart sounds.   Pulmonary/Chest: Effort normal and breath sounds normal.  Neurological: She is alert and oriented to person, place, and time.  Skin: Skin is warm and dry.  Psychiatric: She has a normal mood and affect. Her behavior is normal.   She does have a small scab on the inferior portion of the right nostril as well as a small irritated area on the turbinate in the left nostril. In fact that her by on the left side is very large and looks like it's obstructing but she feels like she can breathe normally through that side.       Assessment & Plan:  DM- well controlled with A1C of 6.5.  Urine mico is neg.  continue current regimen.  Follow-up in 3 months.  HTN - well controlled. Continue current regimen of Edarbi 80mg .  F/U in 3-4 months   Nasal sore/scab - Trial of mupirocin ointment. Encouraged her to try running a human to fire in the home. She does use a humidifier on her CPAP. If this doesn't get things to heal over the next week or 2 then please let me know. She also has a large turban on the left side that might benefit from a nasal steroid spray if she's not improving.

## 2014-01-16 NOTE — Telephone Encounter (Signed)
Pt called and informed that we have Edarbi samples if she would like to p/u she can come by and get them. Donna Dyer, Donna Dyer

## 2014-01-21 ENCOUNTER — Ambulatory Visit (INDEPENDENT_AMBULATORY_CARE_PROVIDER_SITE_OTHER): Payer: Commercial Managed Care - PPO | Admitting: Physician Assistant

## 2014-01-21 ENCOUNTER — Encounter: Payer: Self-pay | Admitting: Physician Assistant

## 2014-01-21 VITALS — BP 155/84 | HR 87 | Ht 67.0 in | Wt 231.0 lb

## 2014-01-21 DIAGNOSIS — R059 Cough, unspecified: Secondary | ICD-10-CM

## 2014-01-21 DIAGNOSIS — R05 Cough: Secondary | ICD-10-CM | POA: Diagnosis not present

## 2014-01-21 DIAGNOSIS — J069 Acute upper respiratory infection, unspecified: Secondary | ICD-10-CM | POA: Diagnosis not present

## 2014-01-21 DIAGNOSIS — J0100 Acute maxillary sinusitis, unspecified: Secondary | ICD-10-CM

## 2014-01-21 MED ORDER — AMOXICILLIN-POT CLAVULANATE 875-125 MG PO TABS: 1.0000 | ORAL_TABLET | Freq: Two times a day (BID) | ORAL | Status: DC

## 2014-01-21 MED ORDER — HYDROCODONE-HOMATROPINE 5-1.5 MG/5ML PO SYRP: 5.0000 mL | ORAL_SOLUTION | Freq: Every evening | ORAL | Status: DC | PRN

## 2014-01-21 NOTE — Patient Instructions (Signed)
Mucinex(regular) twice a day.  Hycodan at bedtime for cough.   If not improving or worsening given rx for augmentin for 10 days.  Continue using ayr.   Sinusitis Sinusitis is redness, soreness, and inflammation of the paranasal sinuses. Paranasal sinuses are air pockets within the bones of your face (beneath the eyes, the middle of the forehead, or above the eyes). In healthy paranasal sinuses, mucus is able to drain out, and air is able to circulate through them by way of your nose. However, when your paranasal sinuses are inflamed, mucus and air can become trapped. This can allow bacteria and other germs to grow and cause infection. Sinusitis can develop quickly and last only a short time (acute) or continue over a long period (chronic). Sinusitis that lasts for more than 12 weeks is considered chronic.  CAUSES  Causes of sinusitis include:  Allergies.  Structural abnormalities, such as displacement of the cartilage that separates your nostrils (deviated septum), which can decrease the air flow through your nose and sinuses and affect sinus drainage.  Functional abnormalities, such as when the small hairs (cilia) that line your sinuses and help remove mucus do not work properly or are not present. SIGNS AND SYMPTOMS  Symptoms of acute and chronic sinusitis are the same. The primary symptoms are pain and pressure around the affected sinuses. Other symptoms include:  Upper toothache.  Earache.  Headache.  Bad breath.  Decreased sense of smell and taste.  A cough, which worsens when you are lying flat.  Fatigue.  Fever.  Thick drainage from your nose, which often is green and may contain pus (purulent).  Swelling and warmth over the affected sinuses. DIAGNOSIS  Your health care provider will perform a physical exam. During the exam, your health care provider may:  Look in your nose for signs of abnormal growths in your nostrils (nasal polyps).  Tap over the affected sinus  to check for signs of infection.  View the inside of your sinuses (endoscopy) using an imaging device that has a light attached (endoscope). If your health care provider suspects that you have chronic sinusitis, one or more of the following tests may be recommended:  Allergy tests.  Nasal culture. A sample of mucus is taken from your nose, sent to a lab, and screened for bacteria.  Nasal cytology. A sample of mucus is taken from your nose and examined by your health care provider to determine if your sinusitis is related to an allergy. TREATMENT  Most cases of acute sinusitis are related to a viral infection and will resolve on their own within 10 days. Sometimes medicines are prescribed to help relieve symptoms (pain medicine, decongestants, nasal steroid sprays, or saline sprays).  However, for sinusitis related to a bacterial infection, your health care provider will prescribe antibiotic medicines. These are medicines that will help kill the bacteria causing the infection.  Rarely, sinusitis is caused by a fungal infection. In theses cases, your health care provider will prescribe antifungal medicine. For some cases of chronic sinusitis, surgery is needed. Generally, these are cases in which sinusitis recurs more than 3 times per year, despite other treatments. HOME CARE INSTRUCTIONS   Drink plenty of water. Water helps thin the mucus so your sinuses can drain more easily.  Use a humidifier.  Inhale steam 3 to 4 times a day (for example, sit in the bathroom with the shower running).  Apply a warm, moist washcloth to your face 3 to 4 times a day, or as  directed by your health care provider.  Use saline nasal sprays to help moisten and clean your sinuses.  Take medicines only as directed by your health care provider.  If you were prescribed either an antibiotic or antifungal medicine, finish it all even if you start to feel better. SEEK IMMEDIATE MEDICAL CARE IF:  You have increasing  pain or severe headaches.  You have nausea, vomiting, or drowsiness.  You have swelling around your face.  You have vision problems.  You have a stiff neck.  You have difficulty breathing. MAKE SURE YOU:   Understand these instructions.  Will watch your condition.  Will get help right away if you are not doing well or get worse. Document Released: 12/27/2004 Document Revised: 05/13/2013 Document Reviewed: 01/11/2011 [facility] Patient Information 2015 [place], [place]. This information is not intended to replace advice given to you by your health care provider. Make sure you discuss any questions you have with your health care provider.

## 2014-01-21 NOTE — Progress Notes (Signed)
   Subjective:    Patient ID: Donna Dyer, female    DOB: 1960/01/14, 54 y.o.   MRN: [id number]  HPI  Pt presents to the clinic with cough and sinus pressure that started Saturday night and has continued to worsen. Pt has not taken anything OTC except cough syrup which helps minimally. Cough is worse symptom and hard for her to sleep at night. No wheezing or SOB. No ST or ear pain. She dose have some left sided maxillary pressure. When cough is productive it is yellow to green sputum. No fever, chills, n/v/d.    Review of Systems  All other systems reviewed and are negative.      Objective:   Physical Exam  Constitutional: She is oriented to person, place, and time. She appears well-developed and well-nourished.  HENT:  Head: Normocephalic and atraumatic.  Right Ear: External ear normal.  Left Ear: External ear normal.  Nose: Nose normal.  Mouth/Throat: Oropharynx is clear and moist. No oropharyngeal exudate.  TM"s clear bilaterally.  Pressure to palpation over left maxillary sinus.  Left turbinates red and swollen. No sores or lesion present.   Eyes: Conjunctivae are normal. Right eye exhibits no discharge. Left eye exhibits no discharge.  Neck: Normal range of motion. Neck supple.  Cardiovascular: Normal rate, regular rhythm and normal heart sounds.   Pulmonary/Chest: Effort normal and breath sounds normal. She has no wheezes.  Lymphadenopathy:    She has no cervical adenopathy.  Neurological: She is alert and oriented to person, place, and time.  Skin: Skin is dry.  Psychiatric: She has a normal mood and affect. Her behavior is normal.          Assessment & Plan:  Acute sinusitis/cough/URI- discussed with patient due to duration and symptoms appears viral. Suggest mucinex twice daily, honey/tea preparations, ayr for nasal sinuses and possible flonase but watch for too much dryness, hycodan given for cough at bedtime. If not improving or worsening over next 2-3 days  printed rx for augmentin to start. reassured pt that lungs sound great. Follow up as needed.

## 2014-03-04 ENCOUNTER — Ambulatory Visit (INDEPENDENT_AMBULATORY_CARE_PROVIDER_SITE_OTHER): Payer: Commercial Managed Care - PPO | Admitting: Family Medicine

## 2014-03-04 ENCOUNTER — Telehealth: Payer: Self-pay | Admitting: Family Medicine

## 2014-03-04 ENCOUNTER — Encounter: Payer: Self-pay | Admitting: Family Medicine

## 2014-03-04 VITALS — BP 146/90 | HR 82 | Wt 228.0 lb

## 2014-03-04 DIAGNOSIS — R7989 Other specified abnormal findings of blood chemistry: Secondary | ICD-10-CM

## 2014-03-04 DIAGNOSIS — E119 Type 2 diabetes mellitus without complications: Secondary | ICD-10-CM

## 2014-03-04 DIAGNOSIS — I10 Essential (primary) hypertension: Secondary | ICD-10-CM | POA: Diagnosis not present

## 2014-03-04 DIAGNOSIS — R799 Abnormal finding of blood chemistry, unspecified: Secondary | ICD-10-CM

## 2014-03-04 DIAGNOSIS — L299 Pruritus, unspecified: Secondary | ICD-10-CM | POA: Diagnosis not present

## 2014-03-04 MED ORDER — EXENATIDE ER 2 MG ~~LOC~~ SUSR: 2.0000 mg | SUBCUTANEOUS | Status: DC

## 2014-03-04 NOTE — Progress Notes (Signed)
   Subjective:    Patient ID: Donna Dyer, female    DOB: [birth year], 54 y.o.   MRN: [id number]  HPI  Here for blood pressure follow-up today. We had switched her from losartan to [place] about 2 months ago. She is not any palms or side effects of medication but feels like her blood pressure was actually running a little bit better on the losartan. She does check her blood pressures at home. It was elevated at home today as well. No NSAIDs, decongestant.  Watching the salt in her diet.  Not wearing her CPAP as much since she has been sick.    Diabetes-she also noticed that she's been itching after her injection of Trulicity. She said the first injection she did not notice anything and after the second she had some itching sporadically for the next couple days. Now after the third injection she started itching in her right foot the day after. She says it's very intense. She says it does tend to move around to different parts of her body is not her entire body itching at the same time. She has not noticed any hives or rash. No fevers chills or sweats.  Review of Systems     Objective:   Physical Exam  Constitutional: She is oriented to person, place, and time. She appears well-developed and well-nourished.  HENT:  Head: Normocephalic and atraumatic.  Cardiovascular: Normal rate, regular rhythm and normal heart sounds.   Pulmonary/Chest: Effort normal and breath sounds normal.  Neurological: She is alert and oriented to person, place, and time.  Skin: Skin is warm and dry.  Psychiatric: She has a normal mood and affect. Her behavior is normal.   No Rash        Assessment & Plan:  HTN- uncontrolled.  She is unable to take HCTZ or chlorthalidone. And had taken spinal active the past and felt like it was ineffective. I think we should look for secondary causes for her elevated blood pressure since she is Re: On a beta blocker, calcium channel blocker, ARB, alpha-blocker. We'll set her up  for US of the renal arteries to evaluate for stenosis. No bruits  Also check for primary aldosteronism.  I did strongly encourage her to make sure she's wearing her CPAP consistently over the next couple weeks. She's been a little inconsistent because she's had a cold recently  DM- medication S.E.  Ithcing on the trulicity. Will check liver enzymes just to make sure that those are in the normal range today. Could be a hypersensitivity reaction as itching is not a common side effect.  Will have her stop and switch to another product.  Pruritic condition - see note above.

## 2014-03-04 NOTE — Telephone Encounter (Addendum)
I put in labs to check for primary aldosteronism.  I might be able to be added to liver function drawn yesterday but not sure. Also lets change the trulicity. Could be getting a hypersensitivity reaction with the itching. We can try bydureon. Also once a week but the solution it is in is very different.

## 2014-03-05 ENCOUNTER — Telehealth: Payer: Self-pay | Admitting: *Deleted

## 2014-03-05 LAB — HEPATIC FUNCTION PANEL
ALT: 16 U/L (ref 0–35)
AST: 16 U/L (ref 0–37)
Albumin: 4 g/dL (ref 3.5–5.2)
Alkaline Phosphatase: 110 U/L (ref 39–117)
BILIRUBIN DIRECT: 0.1 mg/dL (ref 0.0–0.3)
BILIRUBIN INDIRECT: 0.3 mg/dL (ref 0.2–1.2)
BILIRUBIN TOTAL: 0.4 mg/dL (ref 0.2–1.2)
Total Protein: 8.1 g/dL (ref 6.0–8.3)

## 2014-03-05 NOTE — Telephone Encounter (Signed)
Called and asked if this could be added on and was told that this would need to be collected in a different tube.. Will route to

## 2014-03-05 NOTE — Telephone Encounter (Signed)
Spoke With patient & told her lab would have to be drawn into another tube. Patient stated she will go the lab across the street from her job on tomorrow 03/06/14.

## 2014-03-12 LAB — ALDOSTERONE + RENIN ACTIVITY W/ RATIO
ALDO / PRA RATIO: 54.5 ratio — AB (ref 0.9–28.9)
ALDOSTERONE: 6 ng/dL
PRA LC/MS/MS: 0.11 ng/mL/h — ABNORMAL LOW (ref 0.25–5.82)

## 2014-03-18 ENCOUNTER — Ambulatory Visit (INDEPENDENT_AMBULATORY_CARE_PROVIDER_SITE_OTHER): Payer: Commercial Managed Care - PPO | Admitting: Family Medicine

## 2014-03-18 VITALS — BP 154/86 | HR 85 | Wt 230.0 lb

## 2014-03-18 DIAGNOSIS — I10 Essential (primary) hypertension: Secondary | ICD-10-CM

## 2014-03-18 MED ORDER — AZILSARTAN MEDOXOMIL 80 MG PO TABS: 1.0000 | ORAL_TABLET | Freq: Every day | ORAL | Status: DC

## 2014-03-18 NOTE — Addendum Note (Signed)
Addended by: Beatrice Lecher D on: 03/18/2014 04:03 PM   Modules accepted: Orders

## 2014-03-18 NOTE — Progress Notes (Signed)
   Subjective:    Patient ID: Donna Dyer, female    DOB: [birth year], 54 y.o.   MRN: [id number]  HPI  Donna Dyer was in today for a blood pressure check. I had asked her to come back in today after wearing her CPAP consistently for 2 weeks.  Review of Systems     Objective:   Physical Exam        Assessment & Plan:  Hypertension-uncontrolled. I think blood pressure looks better on the Edarbi 80 mg. Encourage her to re-start the [place]. We are working on the endocrinology referral as well.  Beatrice Lecher, MD

## 2014-03-19 ENCOUNTER — Other Ambulatory Visit: Payer: Self-pay | Admitting: *Deleted

## 2014-03-19 MED ORDER — LOSARTAN POTASSIUM 100 MG PO TABS: 100.0000 mg | ORAL_TABLET | Freq: Every day | ORAL | Status: DC

## 2014-04-11 ENCOUNTER — Encounter (HOSPITAL_BASED_OUTPATIENT_CLINIC_OR_DEPARTMENT_OTHER): Payer: Self-pay | Admitting: Emergency Medicine

## 2014-04-11 ENCOUNTER — Emergency Department (HOSPITAL_BASED_OUTPATIENT_CLINIC_OR_DEPARTMENT_OTHER)
Admission: EM | Admit: 2014-04-11 | Discharge: 2014-04-11 | Disposition: A | Discharge status: Home / Self Care | Payer: Commercial Managed Care - PPO | Attending: Emergency Medicine | Admitting: Emergency Medicine

## 2014-04-11 DIAGNOSIS — I10 Essential (primary) hypertension: Secondary | ICD-10-CM | POA: Insufficient documentation

## 2014-04-11 DIAGNOSIS — Z8742 Personal history of other diseases of the female genital tract: Secondary | ICD-10-CM | POA: Diagnosis not present

## 2014-04-11 DIAGNOSIS — M79605 Pain in left leg: Secondary | ICD-10-CM

## 2014-04-11 DIAGNOSIS — R252 Cramp and spasm: Secondary | ICD-10-CM | POA: Diagnosis not present

## 2014-04-11 DIAGNOSIS — M79661 Pain in right lower leg: Secondary | ICD-10-CM | POA: Diagnosis not present

## 2014-04-11 DIAGNOSIS — M79662 Pain in left lower leg: Secondary | ICD-10-CM | POA: Diagnosis present

## 2014-04-11 DIAGNOSIS — Z862 Personal history of diseases of the blood and blood-forming organs and certain disorders involving the immune mechanism: Secondary | ICD-10-CM | POA: Diagnosis not present

## 2014-04-11 DIAGNOSIS — E119 Type 2 diabetes mellitus without complications: Secondary | ICD-10-CM | POA: Diagnosis not present

## 2014-04-11 DIAGNOSIS — M79604 Pain in right leg: Secondary | ICD-10-CM

## 2014-04-11 DIAGNOSIS — Z79899 Other long term (current) drug therapy: Secondary | ICD-10-CM | POA: Diagnosis not present

## 2014-04-11 LAB — CBC WITH DIFFERENTIAL/PLATELET
Basophils Absolute: 0 10*3/uL (ref 0.0–0.1)
Basophils Relative: 0 % (ref 0–1)
Eosinophils Absolute: 0.1 10*3/uL (ref 0.0–0.7)
Eosinophils Relative: 2 % (ref 0–5)
HEMATOCRIT: 34.1 % — AB (ref 36.0–46.0)
Hemoglobin: 11.2 g/dL — ABNORMAL LOW (ref 12.0–15.0)
LYMPHS PCT: 33 % (ref 12–46)
Lymphs Abs: 2 10*3/uL (ref 0.7–4.0)
MCH: 27.1 pg (ref 26.0–34.0)
MCHC: 32.8 g/dL (ref 30.0–36.0)
MCV: 82.6 fL (ref 78.0–100.0)
MONO ABS: 0.7 10*3/uL (ref 0.1–1.0)
Monocytes Relative: 11 % (ref 3–12)
NEUTROS ABS: 3.3 10*3/uL (ref 1.7–7.7)
NEUTROS PCT: 54 % (ref 43–77)
Platelets: 266 10*3/uL (ref 150–400)
RBC: 4.13 MIL/uL (ref 3.87–5.11)
RDW: 14.2 % (ref 11.5–15.5)
WBC: 6.1 10*3/uL (ref 4.0–10.5)

## 2014-04-11 LAB — BASIC METABOLIC PANEL
Anion gap: 10 (ref 5–15)
BUN: 14 mg/dL (ref 6–23)
CHLORIDE: 103 mmol/L (ref 96–112)
CO2: 25 mmol/L (ref 19–32)
Calcium: 8.9 mg/dL (ref 8.4–10.5)
Creatinine, Ser: 0.89 mg/dL (ref 0.50–1.10)
GFR calc non Af Amer: 72 mL/min — ABNORMAL LOW (ref >90)
GFR, EST AFRICAN AMERICAN: 83 mL/min — AB (ref >90)
Glucose, Bld: 153 mg/dL — ABNORMAL HIGH (ref 70–99)
POTASSIUM: 3.9 mmol/L (ref 3.5–5.1)
SODIUM: 138 mmol/L (ref 135–145)

## 2014-04-11 LAB — URINALYSIS, ROUTINE W REFLEX MICROSCOPIC
BILIRUBIN URINE: NEGATIVE
Glucose, UA: NEGATIVE mg/dL
Hgb urine dipstick: NEGATIVE
Ketones, ur: NEGATIVE mg/dL
Leukocytes, UA: NEGATIVE
NITRITE: NEGATIVE
PROTEIN: NEGATIVE mg/dL
SPECIFIC GRAVITY, URINE: 1.02 (ref 1.005–1.030)
UROBILINOGEN UA: 1 mg/dL (ref 0.0–1.0)
pH: 7 (ref 5.0–8.0)

## 2014-04-11 MED ORDER — SODIUM CHLORIDE 0.9 % IV BOLUS (SEPSIS)
1000.0000 mL | Freq: Once | INTRAVENOUS | Status: AC
  Administered 2014-04-11: 1000 mL via INTRAVENOUS

## 2014-04-11 NOTE — Discharge Instructions (Signed)

## 2014-04-11 NOTE — ED Provider Notes (Signed)
CSN: [id number]     Arrival date & time 04/11/14  8768 History   First MD Initiated Contact with Patient 04/11/14 0710     Chief Complaint  Patient presents with  . Leg Pain     (Consider location/radiation/quality/duration/timing/severity/associated sxs/prior Treatment) Patient is a 54 y.o. female presenting with leg pain.  Leg Pain Location:  Leg Injury: no   Leg location:  L lower leg and R lower leg Pain details:    Quality:  Cramping   Radiates to:  Does not radiate   Severity:  Severe   Onset quality:  Sudden   Duration: chronically for years, worse last night.   Timing:  Constant   Progression:  Unchanged Chronicity:  New Dislocation: no   Foreign body present:  No foreign bodies Prior injury to area:  No Relieved by:  Nothing Worsened by:  Bearing weight Ineffective treatments:  None tried Associated symptoms: stiffness and tingling   Associated symptoms: no back pain, no itching, no numbness and no swelling     Past Medical History  Diagnosis Date  . Hyperlipidemia   . Hypertension   . Anemia   . Endometriosis   . Pain, joint, hip, right     chronic- Dr Alvan Dame Dr Nelva Bush  . Edema     LLE- podiatry in [place]  . Diabetes mellitus   . Goiter   . Hemorrhoids    Past Surgical History  Procedure Laterality Date  . Esophagogastroduodenoscopy  7-11    for anemia  . Total abdominal hysterectomy  1995    bilat oophorectomy/ endometriosis  . Flexible sigmoidoscopy  01/31/2011    Procedure: FLEXIBLE SIGMOIDOSCOPY;  Surgeon: Missy Sabins, MD;  Location: [place];  Service: Endoscopy;  Laterality: N/A;  . Vulvar lesion removal  09/07/2011    Procedure: VULVAR LESION;  Surgeon: Alvino Chapel, MD;  Location: [facility];  Service: Gynecology;  Laterality: N/A;  vaginal lesion  . Back injections     Family History  Problem Relation Age of Onset  . Hyperlipidemia Mother   . Cancer Father     bladder  . Heart attack Father     MI at age 39   . Diabetes Sister   . Diabetes Brother   . Diabetes Brother   . Diabetes Brother   . Diabetes Brother   . Sarcoidosis Brother   . Sarcoidosis Sister   . Anesthesia problems Neg Hx   . Hypotension Neg Hx   . Malignant hyperthermia Neg Hx   . Pseudochol deficiency Neg Hx    History  Substance Use Topics  . Smoking status: Never Smoker   . Smokeless tobacco: Never Used  . Alcohol Use: No     Comment: Doesn't drink    OB History    No data available     Review of Systems  Musculoskeletal: Positive for stiffness. Negative for back pain.  Skin: Negative for itching.  All other systems reviewed and are negative.     Allergies  Pineapple; Onglyza; Trulicity; Amlodipine besylate; Azithromycin; Chlorthalidone; Codeine; Hctz; Metformin and related; and Sulfonamide derivatives  Home Medications   Prior to Admission medications   Medication Sig Start Date End Date Taking? Authorizing Provider  Acetaminophen (TYLENOL EXTRA STRENGTH PO) Take 2 tablets by mouth as needed.    Historical Provider, MD  AMBULATORY NON FORMULARY MEDICATION One touch ultra strips and lancets for the One Touch Mini. Check fasting blood sugar in the morning and random blood sugar in  the evening. Diagnosis Diabetes Type 2, ICD-10 code E11.9 12/30/13   Hali Marry, MD  Calcium-Vitamin D-Vitamin K (VIACTIV PO) Take by mouth.    Historical Provider, MD  Cholecalciferol (VITAMIN D) 2000 UNITS CAPS Take 2,000 Units by mouth daily.     Historical Provider, MD  cloNIDine (CATAPRES - DOSED IN MG/24 HR) 0.2 mg/24hr patch APPLY 1 PATCH ONTO THE SKIN ONCE A WEEK 11/29/13   Hali Marry, MD  Exenatide ER (BYDUREON) 2 MG SUSR Inject 2 mg into the skin once a week. 03/04/14   Hali Marry, MD  fexofenadine (ALLEGRA) 180 MG tablet Take 1 tablet (180 mg total) by mouth daily. 09/11/12   Hali Marry, MD  losartan (COZAAR) 100 MG tablet Take 1 tablet (100 mg total) by mouth daily. 03/19/14 03/19/15   Hali Marry, MD  Multiple Vitamin (MULTIVITAMIN) tablet Take 1 tablet by mouth daily.      Historical Provider, MD  mupirocin ointment (BACTROBAN) 2 % Apply to inside of each nares daily for 10 days then twice a week for maintenance. 01/16/14   Hali Marry, MD  nebivolol (BYSTOLIC) 10 MG tablet Take 2 tablets (20 mg total) by mouth daily. 12/31/13   Hali Marry, MD  TRULICITY 4.26 ST/4.1DQ [facility]  02/12/14   Historical Provider, MD  verapamil (CALAN-SR) 180 MG CR tablet Take 1 tablet (180 mg total) by mouth at bedtime. 11/29/13 11/29/14  Hali Marry, MD   BP 159/91 mmHg  Pulse 100  Temp(Src) 98.3 F (36.8 C) (Oral)  Resp 18  Ht 5\' 6"  (1.676 m)  Wt 223 lb (101.152 kg)  BMI 36.01 kg/m2  SpO2 98% Physical Exam  Constitutional: She is oriented to person, place, and time. She appears well-developed and well-nourished.  HENT:  Head: Normocephalic and atraumatic.  Right Ear: External ear normal.  Left Ear: External ear normal.  Eyes: Conjunctivae and EOM are normal. Pupils are equal, round, and reactive to light.  Neck: Normal range of motion. Neck supple.  Cardiovascular: Normal rate, regular rhythm, normal heart sounds and intact distal pulses.   Pulmonary/Chest: Effort normal and breath sounds normal.  Abdominal: Soft. Bowel sounds are normal. There is no tenderness.  Musculoskeletal: Normal range of motion.  Neurological: She is alert and oriented to person, place, and time.  No neuro deficits in legs  Skin: Skin is warm and dry.  Vitals reviewed.   ED Course  Procedures (including critical care time) Labs Review Labs Reviewed  CBC WITH DIFFERENTIAL/PLATELET - Abnormal; Notable for the following:    Hemoglobin 11.2 (*)    HCT 34.1 (*)    All other components within normal limits  BASIC METABOLIC PANEL - Abnormal; Notable for the following:    Glucose, Bld 153 (*)    GFR calc non Af Amer 72 (*)    GFR calc Af Amer 83 (*)    All other components  within normal limits  URINALYSIS, ROUTINE W REFLEX MICROSCOPIC - Abnormal; Notable for the following:    APPearance CLOUDY (*)    All other components within normal limits    Imaging Review No results found.   EKG Interpretation None      MDM   Final diagnoses:  Pain of left lower extremity  Pain of right lower extremity  Cramp of both lower extremities    54 y.o. female with pertinent PMH of current wu for hyperaldosteronism, HTN, DM presents with leg cramping, acute on chronic x 1 day.  No fevers, gi symptoms, or other systemic symptoms.  Symptoms vastly improved prior to arrival, and are typically worse in the am.  Pt does have some back pain, but no concerning historical elements of cauda equina. Physical exam today benign, no swelling or other pathology to indicate DVT or cord pathology.  Electrolytes as above, unremarkable.  Likely neuropathic pain.  DC home in stable condition to fu with PCP.    I have reviewed all laboratory and imaging studies if ordered as above  1. Pain of left lower extremity   2. Pain of right lower extremity   3. Cramp of both lower extremities         Debby Freiberg, MD 04/11/14 [phone]

## 2014-04-11 NOTE — ED Notes (Addendum)
Pt woke up with bilateral calf muscle cramping since 0530 this am.  Typically she has cramps and is able to walk it out.  This am she states it would not ease with walking.  She also relates that her left leg seemed to draw inward.  Pt also states she had recently been told she has an elevated aldosterone level and that her Endocrinologist took her off several medications.  Denies long trips or hormone levels.

## 2014-04-15 ENCOUNTER — Other Ambulatory Visit: Payer: Self-pay | Admitting: Obstetrics and Gynecology

## 2014-04-15 LAB — BASIC METABOLIC PANEL
BUN: 14 mg/dL (ref 4–21)
CREATININE: 0.9 mg/dL (ref 0.5–1.1)
Glucose: 153 mg/dL
POTASSIUM: 4.3 mmol/L (ref 3.4–5.3)
SODIUM: 140 mmol/L (ref 137–147)

## 2014-04-16 LAB — CYTOLOGY - PAP

## 2014-04-16 NOTE — Progress Notes (Signed)
Quick Note:  Call patient: Your Pap smear is normal. Repeat in 3 years. ______ 

## 2014-04-17 ENCOUNTER — Ambulatory Visit: Payer: Commercial Managed Care - PPO | Admitting: Family Medicine

## 2014-05-15 ENCOUNTER — Telehealth: Payer: Self-pay | Admitting: *Deleted

## 2014-05-15 MED ORDER — VERAPAMIL HCL ER 180 MG PO TBCR: 180.0000 mg | EXTENDED_RELEASE_TABLET | Freq: Two times a day (BID) | ORAL | Status: DC

## 2014-05-19 MED ORDER — VERAPAMIL HCL ER 180 MG PO TBCR: 180.0000 mg | EXTENDED_RELEASE_TABLET | Freq: Two times a day (BID) | ORAL | Status: DC

## 2014-05-22 ENCOUNTER — Encounter: Payer: Self-pay | Admitting: *Deleted

## 2014-06-03 NOTE — Telephone Encounter (Signed)
Medications sent

## 2014-06-04 ENCOUNTER — Telehealth: Payer: Self-pay | Admitting: Family Medicine

## 2014-06-04 DIAGNOSIS — M79605 Pain in left leg: Secondary | ICD-10-CM

## 2014-06-04 NOTE — Telephone Encounter (Signed)
Pt wants a referral to Neurologist because she is thinking that there is something wrong with the nerves in foot or leg. Pt has already seen Dr.T and had a epidural injection  And would like to now see a neurologist for a consult. Thanks, [employer]

## 2014-06-04 NOTE — Telephone Encounter (Signed)
Ok to place referral.

## 2014-06-05 NOTE — Telephone Encounter (Signed)
What dx should I use to associate? Cramping in feet? Please advise.

## 2014-06-05 NOTE — Telephone Encounter (Signed)
Referral placed to neurology

## 2014-06-05 NOTE — Telephone Encounter (Signed)
Can use pain of lower extremity

## 2014-06-17 ENCOUNTER — Ambulatory Visit (INDEPENDENT_AMBULATORY_CARE_PROVIDER_SITE_OTHER): Payer: Commercial Managed Care - PPO | Admitting: Family Medicine

## 2014-06-17 ENCOUNTER — Encounter: Payer: Self-pay | Admitting: Family Medicine

## 2014-06-17 VITALS — BP 129/72 | HR 81 | Ht 66.0 in | Wt 228.0 lb

## 2014-06-17 DIAGNOSIS — E119 Type 2 diabetes mellitus without complications: Secondary | ICD-10-CM | POA: Diagnosis not present

## 2014-06-17 DIAGNOSIS — I10 Essential (primary) hypertension: Secondary | ICD-10-CM

## 2014-06-17 LAB — POCT GLYCOSYLATED HEMOGLOBIN (HGB A1C): HEMOGLOBIN A1C: 6.3

## 2014-06-17 MED ORDER — METFORMIN HCL ER 500 MG PO TB24: 500.0000 mg | ORAL_TABLET | Freq: Every day | ORAL | Status: DC

## 2014-06-17 MED ORDER — VERAPAMIL HCL ER 180 MG PO TBCR: 180.0000 mg | EXTENDED_RELEASE_TABLET | Freq: Two times a day (BID) | ORAL | Status: DC

## 2014-06-17 NOTE — Progress Notes (Signed)
   Subjective:    Patient ID: Donna Dyer, female    DOB: 1960/12/05, 54 y.o.   MRN: [id number]  HPI Hypertension- Pt denies chest pain, SOB, dizziness, or heart palpitations.  Taking meds as directed w/o problems.  Denies medication side effects.  She is on double the verapamil per endocinology. She tested neg for primary aldostereronis.   Diabetes - no hypoglycemic events. No wounds or sores that are not healing well. No increased thirst or urination. Checking glucose at home. Taking medications as prescribed without any side effects.  Toes have been numb since had he rback injection a couple of years ago.  Feels it is getting worse.  It affects all of her toes.    Review of Systems     Objective:   Physical Exam  Constitutional: She is oriented to person, place, and time. She appears well-developed and well-nourished.  HENT:  Head: Normocephalic and atraumatic.  Cardiovascular: Normal rate, regular rhythm and normal heart sounds.   Pulmonary/Chest: Effort normal and breath sounds normal.  Neurological: She is alert and oriented to person, place, and time.  Skin: Skin is warm and dry.  Psychiatric: She has a normal mood and affect. Her behavior is normal.          Assessment & Plan:  HTN - well controlled. Continue current regimen.   diabetes-hemoglobin A1c of 6.3 today which is fantastic and well controlled. Continue current regimen. Follow up in 3 months.

## 2014-06-26 ENCOUNTER — Ambulatory Visit (INDEPENDENT_AMBULATORY_CARE_PROVIDER_SITE_OTHER): Payer: Commercial Managed Care - PPO | Admitting: Neurology

## 2014-06-26 ENCOUNTER — Encounter: Payer: Self-pay | Admitting: Neurology

## 2014-06-26 VITALS — BP 122/72 | HR 69 | Ht 66.0 in | Wt 226.0 lb

## 2014-06-26 DIAGNOSIS — R200 Anesthesia of skin: Secondary | ICD-10-CM

## 2014-06-26 DIAGNOSIS — R600 Localized edema: Secondary | ICD-10-CM

## 2014-06-26 DIAGNOSIS — R208 Other disturbances of skin sensation: Secondary | ICD-10-CM | POA: Diagnosis not present

## 2014-06-26 DIAGNOSIS — M79676 Pain in unspecified toe(s): Secondary | ICD-10-CM

## 2014-06-26 NOTE — Patient Instructions (Addendum)
You may have a condition called peripheral neuropathy, i. e. nerve damage. There is no specific treatment for most neuropathies. The most common cause for neuropathy is diabetes in this country, in which case, tight glucose control is key. Other causes include thyroid disease, and some vitamin deficiencies. Certain medications such as chemotherapy agents and other chemicals or toxins including alcohol can cause neuropathy. There are some genetic conditions or hereditary neuropathies. Typically patients will report a family history of neuropathy in those conditions. There are cases associated with cancers and autoimmune conditions. Most neuropathies are progressive unless a root cause can be found and treated. For most neuropathies there is no actual cure or reversing of symptoms. Painful neuropathy can be difficult to treat symptomatically, but there are some medications available to ease the symptoms. Electrophysiologic testing with nerve conduction velocity studies and EMG (muscle testing) do not always pick up neuropathies that affect the smallest fibers. Other common tests include different type of blood work, and rarely, spinal fluid testing, and sometimes we resort to asking for a nerve and muscle biopsy.  I would like to investigate things further to look for evidence of neuropathy or nerve damage; therefore, I would like to do electrical testing of your muscles and nerves, which is known as EMG/NCV. You had this test in 11/14. Neuropathy or nerve disease or damage can be caused by a variety of causes, most commonly diabetes, some toxins including alcohol or metabolic derangements or hereditary disorders.   You tried gabapentin, but had side effects.

## 2014-06-26 NOTE — Progress Notes (Signed)
Subjective:    Patient ID: Donna Dyer is a 54 y.o. female.  HPI     Star Age, MD, PhD [facility] [address].O. [place], [place] [address]  Dear Dr. Madilyn Fireman,   I saw your patient, Donna Dyer, upon your kind request in my neurologic clinic today for initial consultation of her bilateral foot pain and numbness. The patient is unaccompanied today. As you know, Ms. Donna Dyer is a 54 year old right-handed woman with an underlying medical history of hypertension, diabetes, obesity, obstructive sleep apnea, hyperlipidemia, endometriosis, right hip pain, edema, goiter, low back pain, status post facet injection in 2/14, who reports bilateral toe numbness and toe pain especially from the balls of her feet forward, for the past 2-1/2 years or so. Symptoms are mildly progressive. She feels that she has less sensation in her toes. She worries about blood circulation. She has had lower extremity edema for the past 2+ years. She uses compression stockings. In the past, she had Doppler ultrasound testing of her lower extremities with reports unavailable for me today but she reports that she had no blood clots. She had echocardiogram in 2012 which was unremarkable. For pain in her toes she tries occasional arthritis strength Tylenol. In the past she was tried on gabapentin but as she was titrated up on it she started having more and more side effects including mood related side effects and dizziness and had to stop it. She's never had low back surgery. She denies any radiating pain currently but has ongoing right hip pain. She had L4-5 facet injection in February 2014. She had an EMG nerve conduction test with the left lower extremity in [place] at [facility] on 11/15/2013 which was a normal study. I reviewed the report. She has been seeing rheumatology, Dr. Estanislado Pandy, and has had blood work extensively through her. In September 2015, her vitamin B12 level  was 604. She has a history of MGUS and has seen Dr. Marin Olp in hematology.  She had a lumbar spine MRI without contrast on 02/20/2012 and I reviewed the report: Mild lumbar spondylosis with L4-5 left foraminal protrusion of uncertain significant in this patient with right hip symptoms. Small L5-S1 central protrusion without stenosis. Compared to prior the small central L5-S1 disc protrusion without stenosis is new.  Her main concern is ongoing toe numbness and toe pain bilaterally. She has been trying to lose weight. Her weight fluctuates. She was diagnosed with obstructive sleep apnea some 3 years ago and says she uses a CPAP machine regularly.   Her Past Medical History Is Significant For: Past Medical History  Diagnosis Date  . Hyperlipidemia   . Hypertension   . Anemia   . Endometriosis   . Pain, joint, hip, right     chronic- Dr Alvan Dame Dr Nelva Bush  . Edema     LLE- podiatry in [place]  . Diabetes mellitus   . Goiter   . Hemorrhoids     Her Past Surgical History Is Significant For: Past Surgical History  Procedure Laterality Date  . Esophagogastroduodenoscopy  7-11    for anemia  . Total abdominal hysterectomy  1995    bilat oophorectomy/ endometriosis  . Flexible sigmoidoscopy  01/31/2011    Procedure: FLEXIBLE SIGMOIDOSCOPY;  Surgeon: Missy Sabins, MD;  Location: [place];  Service: Endoscopy;  Laterality: N/A;  . Vulvar lesion removal  09/07/2011    Procedure: VULVAR LESION;  Surgeon: Alvino Chapel, MD;  Location: [facility];  Service: Gynecology;  Laterality: N/A;  vaginal lesion  . Back injections      Her Family History Is Significant For: Family History  Problem Relation Age of Onset  . Hyperlipidemia Mother   . Cancer Father     bladder  . Heart attack Father     MI at age 39  . Diabetes Sister   . Diabetes Brother   . Diabetes Brother   . Diabetes Brother   . Diabetes Brother   . Sarcoidosis Brother   . Sarcoidosis Sister   .  Anesthesia problems Neg Hx   . Hypotension Neg Hx   . Malignant hyperthermia Neg Hx   . Pseudochol deficiency Neg Hx     Her Social History Is Significant For: History   Social History  . Marital Status: Married    Spouse Name: N/A  . Number of Children: 2  . Years of Education: N/A   Occupational History  . 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 Smoking status: Never Smoker   . Smokeless tobacco: Never Used  . Alcohol Use: No     Comment: Doesn't drink   . Drug Use: No  . Sexual Activity: Yes    Birth Control/ Protection: Surgical   Other Topics Concern  . None   Social History Narrative   Lives in [place] with husband and daughter. Ambulatory, no cane or walker. Works at [employer].    [employer].   Right handed.   Caffeine none.    Her Allergies Are:  Allergies  Allergen Reactions  . Pineapple Itching and Swelling    Tongue swells  . Onglyza [Saxagliptin] Itching  . Trulicity [Dulaglutide] Itching    ? Hypersensitivity reaction  . Amlodipine Besylate Other (See Comments)    muscle cramps  . Azithromycin Itching and Rash  . Chlorthalidone Other (See Comments)    Muscle cramps  . Codeine Nausea And Vomiting  . Hctz [Hydrochlorothiazide] Other (See Comments)    Muscle spasms    . Metformin And Related Diarrhea  . Sulfonamide Derivatives Rash  :   Her Current Medications Are:  Outpatient Encounter Prescriptions as of 06/26/2014  Medication Sig  . Acetaminophen (TYLENOL EXTRA STRENGTH PO) Take 2 tablets by mouth as needed.  . AMBULATORY NON FORMULARY MEDICATION One touch ultra strips and lancets for the One Touch Mini. Check fasting blood sugar in the morning and random blood sugar in the evening. Diagnosis Diabetes Type 2, ICD-10 code E11.9  . Calcium-Vitamin D-Vitamin K (VIACTIV PO) Take by mouth.  . Cholecalciferol (VITAMIN D) 2000 UNITS CAPS Take 2,000 Units by mouth daily.   . fexofenadine  (ALLEGRA) 180 MG tablet Take 1 tablet (180 mg total) by mouth daily.  . metFORMIN (GLUCOPHAGE-XR) 500 MG 24 hr tablet Take 1 tablet (500 mg total) by mouth daily with breakfast.  . Multiple Vitamin (MULTIVITAMIN) tablet Take 1 tablet by mouth daily.    . mupirocin ointment (BACTROBAN) 2 % Apply to inside of each nares daily for 10 days then twice a week for maintenance.  . nebivolol (BYSTOLIC) 10 MG tablet Take 2 tablets (20 mg total) by mouth daily.  . verapamil (CALAN-SR) 180 MG CR tablet Take 1 tablet (180 mg total) by mouth 2 (two) times daily.   No facility-administered encounter medications on file as of 06/26/2014.  :   Review of Systems:  Out of a complete 14 point review of systems, all are reviewed and  negative with the exception of these symptoms as listed below:   Review of Systems  Psychiatric/Behavioral: Positive for sleep disturbance.       Sleep [facility]    Objective:  Neurologic Exam  Physical Exam Physical Examination:   Filed Vitals:   06/26/14 1401  BP: 122/72  Pulse: 69    General Examination: The patient is a very pleasant 54 y.o. female in no acute distress. She appears well-developed and well-nourished and well groomed.   HEENT: Normocephalic, atraumatic, pupils are equal, round and reactive to light and accommodation. Funduscopic exam is normal with sharp disc margins noted. Extraocular tracking is good without limitation to gaze excursion or nystagmus noted. Normal smooth pursuit is noted. Hearing is grossly intact. Tympanic membranes are clear bilaterally. Face is symmetric with normal facial animation and normal facial sensation. Speech is clear with no dysarthria noted. There is no hypophonia. There is no lip, neck/head, jaw or voice tremor. Neck is supple with full range of passive and active motion. There are no carotid bruits on auscultation. Oropharynx exam reveals: mild mouth dryness, adequate dental hygiene and moderate airway crowding, due to larger  tongue, larger uvula, and tonsils in place. Mallampati is class II. Tongue protrudes centrally and palate elevates symmetrically. Nasal inspection reveals no significant nasal mucosal bogginess or redness and no septal deviation.   Chest: Clear to auscultation without wheezing, rhonchi or crackles noted.  Heart: S1+S2+0, regular and normal without murmurs, rubs or gallops noted.   Abdomen: Soft, non-tender and non-distended with normal bowel sounds appreciated on auscultation.  Extremities: There is 1+to 2+ pitting edema in the distal lower extremity bilaterally, up to mid shin areas, left a little bit more pronounced than right. She has pitting edema on the dorsum of both feet. Pedal pulses are palpable. She has some chronic stasis-like changes in the distal lower extremities. She has no other trophic changes to her feet.  Skin: Warm and dry without trophic changes noted. There are no varicose veins.  Musculoskeletal: exam reveals no obvious joint deformities, tenderness or joint swelling or erythema.   Neurologically:  Mental status: The patient is awake, alert and oriented in all 4 spheres. Her immediate and remote memory, attention, language skills and fund of knowledge are appropriate. There is no evidence of aphasia, agnosia, apraxia or anomia. Speech is clear with normal prosody and enunciation. Thought process is linear. Mood is normal and affect is normal.  Cranial nerves II - XII are as described above under HEENT exam. In addition: shoulder shrug is normal with equal shoulder height noted. Motor exam: Normal bulk, strength and tone is noted. There is no drift, tremor or rebound. Romberg is negative. Reflexes are 2+ throughout. Babinski: Toes are flexor bilaterally. Fine motor skills and coordination: intact with normal finger taps, normal hand movements, normal rapid alternating patting, normal foot taps and normal foot agility.  Cerebellar testing: No dysmetria or intention tremor on  finger to nose testing. Heel to shin is unremarkable bilaterally. There is no truncal or gait ataxia.  Sensory exam: intact to light touch, pinprick, vibration, temperature sense in the upper extremities and lower extremities with the exception of mild decrease in pinprick and temperature sense in the toes on the balls of the feet. She reports some pain in these areas as well.   Gait, station and balance: She stands easily. No veering to one side is noted. No leaning to one side is noted. Posture is age-appropriate and stance is narrow based. Gait shows normal  stride length and normal pace. No problems turning are noted. She turns en bloc. Tandem walk is unremarkable.              Assessment and plan:   In summary, TAMYRAH BURBAGE is a very pleasant 54 y.o.-year old female with an underlying medical history of hypertension, diabetes, obesity, obstructive sleep apnea, hyperlipidemia, endometriosis, right hip pain, edema, goiter, low back pain, status post facet injection in 2/14, who reports bilateral toe numbness and toe pain especially from the balls of her feet forward, for the past 2-1/2 years or so. On examination she has mild decrease in pinprick and temperature sensation in the toes and the balls of her feet. Given her history, she is at risk for diabetic neuropathy. She had EMG and nerve conduction testing in November 2014 and I suggested we repeat this test at this time for comparison. Her latest hemoglobin A1c from about a week ago was 6.3 which is borderline. Her fasting blood sugar values at home via fingerstick are in the 115 to 120 range she states. She is advised to try to lose weight and pursue strict blood sugar control. She had in the past extensive blood work through different doctors including through rheumatology and hematology. At this juncture, I do not think we need to add any new blood tests. She has a history of MGUS. Of note, she does not drink caffeine every day, she does not drink  alcohol, she tries to drink enough water and she does not smoke. She is advised to continue to strive for weight loss, for her lower extremity swelling which is quite noticeable on exam, she is advised to continue following your instructions and to continue using compression stockings. If this does not help she is advised to follow-up with you for further potential testing. In the past, as I understand, she had Doppler ultrasound of her lower extremities and was told that she did not have any blood clots. I ordered an EMG and nerve conduction test. We will schedule her for this soon, I will see her back after that for routine follow-up. For symptomatic treatment of her foot pain she has tried gabapentin but had side effects. She is using as needed Tylenol at this time and I suggested no new medications at this moment. I answered all her questions today and the patient was in agreement with the above.  Thank you very much for allowing me to participate in the care of this nice patient. If I can be of any further assistance to you please do not hesitate to call me at [phone].  Sincerely,   Star Age, MD, PhD

## 2014-07-04 ENCOUNTER — Encounter: Payer: Self-pay | Admitting: Family Medicine

## 2014-07-18 ENCOUNTER — Encounter: Payer: Self-pay | Admitting: Neurology

## 2014-07-18 ENCOUNTER — Ambulatory Visit (INDEPENDENT_AMBULATORY_CARE_PROVIDER_SITE_OTHER): Payer: Self-pay | Admitting: Neurology

## 2014-07-18 ENCOUNTER — Ambulatory Visit (INDEPENDENT_AMBULATORY_CARE_PROVIDER_SITE_OTHER): Payer: Commercial Managed Care - PPO | Admitting: Neurology

## 2014-07-18 DIAGNOSIS — M5417 Radiculopathy, lumbosacral region: Secondary | ICD-10-CM

## 2014-07-18 DIAGNOSIS — R600 Localized edema: Secondary | ICD-10-CM

## 2014-07-18 DIAGNOSIS — R200 Anesthesia of skin: Secondary | ICD-10-CM

## 2014-07-18 DIAGNOSIS — M79676 Pain in unspecified toe(s): Secondary | ICD-10-CM

## 2014-07-18 HISTORY — DX: Radiculopathy, lumbosacral region: M54.17

## 2014-07-18 MED ORDER — VERAPAMIL HCL ER 120 MG PO TBCR: 120.0000 mg | EXTENDED_RELEASE_TABLET | Freq: Two times a day (BID) | ORAL | Status: DC

## 2014-07-18 NOTE — Progress Notes (Signed)
Please refer to EMG and nerve conduction study procedure note. 

## 2014-07-18 NOTE — Procedures (Signed)
     HISTORY:  Donna Dyer is a 54 year old patient with a history of borderline diabetes, and a history of MGUS who reports a two-year history of right hip discomfort and some numbness of the plantar aspect of the feet bilaterally, right equal to left. The patient is being evaluated for these ongoing symptoms.  NERVE CONDUCTION STUDIES:  Nerve conduction studies were performed on both lower extremities. The distal motor latencies and motor amplitudes for the peroneal and posterior tibial nerves were within normal limits. The nerve conduction velocities for these nerves were also normal. The H reflex latencies were normal. The sensory latencies for the peroneal nerves were within normal limits. The medial and lateral plantar sensory latencies were symmetric and within normal limits bilaterally.   EMG STUDIES:  EMG study was performed on the right lower extremity:  The tibialis anterior muscle reveals 2 to 4K motor units with full recruitment. No fibrillations or positive waves were seen. The peroneus tertius muscle reveals 2 to 4K motor units with full recruitment. No fibrillations or positive waves were seen. The medial gastrocnemius muscle reveals 1 to 3K motor units with full recruitment. 2+ fibrillations and positive waves were seen. The vastus lateralis muscle reveals 2 to 4K motor units with full recruitment. No fibrillations or positive waves were seen. The iliopsoas muscle reveals 2 to 4K motor units with full recruitment. No fibrillations or positive waves were seen. The biceps femoris muscle (long head) reveals 2 to 4K motor units with full recruitment. No fibrillations or positive waves were seen. The lumbosacral paraspinal muscles were tested at 3 levels, and revealed no abnormalities of insertional activity at the upper level tested. 1+ positive waves were seen at the middle and lower levels. There was good relaxation.  EMG study was performed on the left lower  extremity:  The tibialis anterior muscle reveals 2 to 4K motor units with full recruitment. No fibrillations or positive waves were seen. The peroneus tertius muscle reveals 2 to 4K motor units with full recruitment. No fibrillations or positive waves were seen. The medial gastrocnemius muscle reveals 1 to 3K motor units with full recruitment. 1+ fibrillations and positive waves were seen. The vastus lateralis muscle reveals 2 to 4K motor units with full recruitment. No fibrillations or positive waves were seen. The iliopsoas muscle reveals 2 to 4K motor units with full recruitment. No fibrillations or positive waves were seen. The biceps femoris muscle (long head) reveals 2 to 4K motor units with full recruitment. No fibrillations or positive waves were seen. The lumbosacral paraspinal muscles were tested at 3 levels, and revealed no abnormalities of insertional activity at the upper level tested. 1+ positive waves were seen at the middle and lower levels. There was good relaxation.   IMPRESSION:  Nerve conduction studies done on both lower extremities were unremarkable. No evidence of a peripheral neuropathy was seen. EMG evaluation of both lower extremities reveals evidence of acute denervation most consistent with bilateral S1 radiculopathies.  Jill Alexanders MD 07/18/2014 1:15 PM  [facility] [address] [place], [place] [address]  Phone [phone] Fax [phone]

## 2014-07-25 ENCOUNTER — Encounter: Payer: Self-pay | Admitting: Family Medicine

## 2014-08-07 ENCOUNTER — Telehealth: Payer: Self-pay

## 2014-08-07 DIAGNOSIS — M5416 Radiculopathy, lumbar region: Secondary | ICD-10-CM

## 2014-08-07 NOTE — Telephone Encounter (Signed)
Pt is returning call, please call back

## 2014-08-07 NOTE — Telephone Encounter (Signed)
Female answered phone, stating patient will not be back until later this afternoon. I will call back later.

## 2014-08-07 NOTE — Telephone Encounter (Signed)
-----   Message from Star Age, MD sent at 08/07/2014  9:51 AM EDT ----- Please call patient back regarding her recent EMG and nerve conduction test results: On the positive side there is no evidence of widespread neuropathy or nerve damage, however there are signs of bilateral nerve root impingements coming from the very low end of the lumbar spine, in keeping with her prior diagnosis of lumbosacral degenerative spine disease. We talked about her lumbar spine MRI from February 2014 during our visit in June, which showed disc protrusions. If she wishes, we can repeat a lumbar spine MRI to look for signs of progression of her degenerative spine disease. I would be happy to order a lumbar spine MRI if she agrees. Please call patient and let me know.  Star Age, MD, PhD [facility])

## 2014-08-07 NOTE — Progress Notes (Signed)
Quick Note:  Please call patient back regarding her recent EMG and nerve conduction test results: On the positive side there is no evidence of widespread neuropathy or nerve damage, however there are signs of bilateral nerve root impingements coming from the very low end of the lumbar spine, in keeping with her prior diagnosis of lumbosacral degenerative spine disease. We talked about her lumbar spine MRI from February 2014 during our visit in June, which showed disc protrusions. If she wishes, we can repeat a lumbar spine MRI to look for signs of progression of her degenerative spine disease. I would be happy to order a lumbar spine MRI if she agrees. Please call patient and let me know.  Star Age, MD, PhD [facility] ([facility])  ______

## 2014-08-08 NOTE — Telephone Encounter (Signed)
I spoke to patient. She is aware of results. She would like to think about getting repeat MRI due to cost. She will call back next week to let us know what she would like to do.

## 2014-08-08 NOTE — Telephone Encounter (Signed)
Pt is calling again, stated no one has called her back

## 2014-08-11 NOTE — Telephone Encounter (Signed)
Patient is calling back and states she would like to go forward with MRI.  Please call.

## 2014-08-12 NOTE — Telephone Encounter (Signed)
Patient called again stating that she would like to set up her MRI, I advised the patient that I would need the order to be placed in first and that I would contact her once the order is placed in to schedule her appt.  Patient can be reached @ [phone]

## 2014-08-12 NOTE — Telephone Encounter (Signed)
MRI LS spine ordered

## 2014-08-12 NOTE — Telephone Encounter (Signed)
Left message stating that we will put order in and Seth Bake will call back to schedule her. What kind of MRI would you like?

## 2014-08-15 ENCOUNTER — Telehealth: Payer: Self-pay | Admitting: Family Medicine

## 2014-08-15 ENCOUNTER — Encounter: Payer: Self-pay | Admitting: Family Medicine

## 2014-08-15 DIAGNOSIS — E119 Type 2 diabetes mellitus without complications: Secondary | ICD-10-CM

## 2014-08-15 DIAGNOSIS — I10 Essential (primary) hypertension: Secondary | ICD-10-CM

## 2014-08-15 NOTE — Telephone Encounter (Signed)
Patient sent to my chart message today. Her radius endocrinologist is back in town and moved back to [place] area. She would like to get back in with her for consult for her diabetes and her blood pressures. Referral placed.

## 2014-08-20 ENCOUNTER — Ambulatory Visit (INDEPENDENT_AMBULATORY_CARE_PROVIDER_SITE_OTHER): Payer: Commercial Managed Care - PPO

## 2014-08-20 DIAGNOSIS — M5416 Radiculopathy, lumbar region: Secondary | ICD-10-CM | POA: Diagnosis not present

## 2014-09-01 NOTE — Progress Notes (Signed)
Quick Note:  I don't see that we called her with her MRI results yet. Please advise patient that she does have degenerative changes in her lower back at the lower end of her lumbar spine and while she does not have a herniated disc, she has disc bulging at L5-S1 and L4-5. This can cause symptoms in the lower extremities and we can discuss treatment options during her follow-up appointment next month. She may benefit from seeing an orthopedic doctor. Star Age, MD, PhD [facility] ([facility])  ______

## 2014-09-02 ENCOUNTER — Telehealth: Payer: Self-pay

## 2014-09-02 NOTE — Telephone Encounter (Signed)
I spoke to patient and she is aware of the results and recommendation. She requested a sooner appt and I was able to schedule her for 9/1.

## 2014-09-02 NOTE — Telephone Encounter (Signed)
-----   Message from Star Age, MD sent at 09/01/2014  4:38 PM EDT ----- I don't see that we called her with her MRI results yet. Please advise patient that she does have degenerative changes in her lower back at the lower end of her lumbar spine and while she does not have a herniated disc, she has disc bulging at L5-S1 and L4-5. This can cause symptoms in the lower extremities and we can discuss treatment options during her follow-up appointment next month. She may benefit from seeing an orthopedic doctor. Star Age, MD, PhD [facility])

## 2014-09-11 ENCOUNTER — Ambulatory Visit (INDEPENDENT_AMBULATORY_CARE_PROVIDER_SITE_OTHER): Payer: Commercial Managed Care - PPO | Admitting: Neurology

## 2014-09-11 ENCOUNTER — Encounter: Payer: Self-pay | Admitting: Neurology

## 2014-09-11 VITALS — BP 152/78 | HR 72 | Resp 16 | Ht 66.0 in | Wt 226.0 lb

## 2014-09-11 DIAGNOSIS — R7309 Other abnormal glucose: Secondary | ICD-10-CM

## 2014-09-11 DIAGNOSIS — R600 Localized edema: Secondary | ICD-10-CM | POA: Diagnosis not present

## 2014-09-11 DIAGNOSIS — R7303 Prediabetes: Secondary | ICD-10-CM

## 2014-09-11 DIAGNOSIS — M5417 Radiculopathy, lumbosacral region: Secondary | ICD-10-CM | POA: Diagnosis not present

## 2014-09-11 DIAGNOSIS — R208 Other disturbances of skin sensation: Secondary | ICD-10-CM

## 2014-09-11 DIAGNOSIS — R200 Anesthesia of skin: Secondary | ICD-10-CM

## 2014-09-11 NOTE — Progress Notes (Signed)
Subjective:    Patient ID: Donna Dyer is a 54 y.o. female.  HPI     Interim history:   Donna Dyer is a 54 year old right-handed woman with an underlying medical history of hypertension, diabetes, obesity, obstructive sleep apnea, hyperlipidemia, endometriosis, right hip pain, edema, goiter, low back pain, status post facet injection in 2/14, who presents for follow-up consultation of her lower extremity numbness and pain. The patient is unaccompanied today. I first met her on 06/26/2014 at the request of her primary care physician, at which time the patient reported bilateral toe numbness and toe pain especially from the balls of her feet forward, for the past 2-1/2 years or so. I suggested we bring her back for EMG and NCV testing. She had this on 07/18/2014: Nerve conduction studies done on both lower extremities were unremarkable. No evidence of a peripheral neuropathy was seen. EMG evaluation of both lower extremities reveals evidence of acute denervation most consistent with bilateral S1 radiculopathies. I suggested we proceed with a lumbar spine MRI. She had a L-spine MRI without contrast on 08/20/2014: Abnormal MRI lumbar spine (without) demonstrating: 1. At L5-S1: disc bulging with tiny disc protrusions with potential contact upon the descending bilateral S1 roots 2. At L4-5: far left lateral disc bulging with moderate-severe left foraminal stenosis  Today, 09/11/2014: She reports unchanged symptoms. She has toe numbness and sometimes tingling. She has seen a foot doctor. She had tendinitis of the left foot. She could not wear the boot as it was pressing on her leg. She has been treated for prediabetes with metformin but had diarrhea with it. Her latest A1c from June 2016 was 6.2. She has no other new symptoms. She has ongoing issues with her right hip. She had testing on her right hip for this.  Previously:  06/26/2014: Symptoms are mildly progressive. She feels that she has less  sensation in her toes. She worries about blood circulation. She has had lower extremity edema for the past 2+ years. She uses compression stockings. In the past, she had Doppler ultrasound testing of her lower extremities with reports unavailable for me today but she reports that she had no blood clots. She had echocardiogram in 2012 which was unremarkable. For pain in her toes she tries occasional arthritis strength Tylenol. In the past she was tried on gabapentin but as she was titrated up on it she started having more and more side effects including mood related side effects and dizziness and had to stop it. She's never had low back surgery. She denies any radiating pain currently but has ongoing right hip pain. She had L4-5 facet injection in February 2014. She had an EMG nerve conduction test with the left lower extremity in [place] at [facility] on 11/15/2013 which was a normal study. I reviewed the report. She has been seeing rheumatology, Dr. Estanislado Pandy, and has had blood work extensively through her. In September 2015, her vitamin B12 level was 604. She has a history of MGUS and has seen Dr. Marin Olp in hematology.   She had a lumbar spine MRI without contrast on 02/20/2012 and I reviewed the report: Mild lumbar spondylosis with L4-5 left foraminal protrusion of uncertain significant in this patient with right hip symptoms. Small L5-S1 central protrusion without stenosis. Compared to prior the small central L5-S1 disc protrusion without stenosis is new.   Her main concern is ongoing toe numbness and toe pain bilaterally. She has been trying to lose weight. Her weight fluctuates. She was diagnosed with  obstructive sleep apnea some 3 years ago and says she uses a CPAP machine regularly.  Her Past Medical History Is Significant For: Past Medical History  Diagnosis Date  . Hyperlipidemia   . Hypertension   . Anemia   . Endometriosis   . Pain, joint, hip, right     chronic- Dr Alvan Dame Dr Nelva Bush  .  Edema     LLE- podiatry in [place]  . Diabetes mellitus   . Goiter   . Hemorrhoids   . Lumbosacral radiculopathy at S1 07/18/2014    Bilateral    Her Past Surgical History Is Significant For: Past Surgical History  Procedure Laterality Date  . Esophagogastroduodenoscopy  7-11    for anemia  . Total abdominal hysterectomy  1995    bilat oophorectomy/ endometriosis  . Flexible sigmoidoscopy  01/31/2011    Procedure: FLEXIBLE SIGMOIDOSCOPY;  Surgeon: Missy Sabins, MD;  Location: [place];  Service: Endoscopy;  Laterality: N/A;  . Vulvar lesion removal  09/07/2011    Procedure: VULVAR LESION;  Surgeon: Alvino Chapel, MD;  Location: [facility];  Service: Gynecology;  Laterality: N/A;  vaginal lesion  . Back injections      Her Family History Is Significant For: Family History  Problem Relation Age of Onset  . Hyperlipidemia Mother   . Cancer Father     bladder  . Heart attack Father     MI at age 40  . Diabetes Sister   . Diabetes Brother   . Diabetes Brother   . Diabetes Brother   . Diabetes Brother   . Sarcoidosis Brother   . Sarcoidosis Sister   . Anesthesia problems Neg Hx   . Hypotension Neg Hx   . Malignant hyperthermia Neg Hx   . Pseudochol deficiency Neg Hx     Her Social History Is Significant For: Social History   Social History  . Marital Status: Married    Spouse Name: N/A  . Number of Children: 2  . Years of Education: N/A   Occupational History  . 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 Smoking status: Never Smoker   . Smokeless tobacco: Never Used  . Alcohol Use: No     Comment: Doesn't drink   . Drug Use: No  . Sexual Activity: Yes    Birth Control/ Protection: Surgical   Other Topics Concern  . None   Social History Narrative   Lives in [place] with husband and daughter. Ambulatory, no cane or walker. Works at [employer].    [employer].   Right handed.    Caffeine none.    Her Allergies Are:  Allergies  Allergen Reactions  . Pineapple Itching and Swelling    Tongue swells  . Onglyza [Saxagliptin] Itching  . Trulicity [Dulaglutide] Itching    ? Hypersensitivity reaction  . Amlodipine Besylate Other (See Comments)    muscle cramps  . Azithromycin Itching and Rash  . Chlorthalidone Other (See Comments)    Muscle cramps  . Codeine Nausea And Vomiting  . Hctz [Hydrochlorothiazide] Other (See Comments)    Muscle spasms    . Metformin And Related Diarrhea  . Sulfonamide Derivatives Rash  :   Her Current Medications Are:  Outpatient Encounter Prescriptions as of 09/11/2014  Medication Sig  . Acetaminophen (TYLENOL EXTRA STRENGTH PO) Take 2 tablets by mouth as needed.  . AMBULATORY NON FORMULARY MEDICATION One touch ultra strips and lancets for the  One Touch Mini. Check fasting blood sugar in the morning and random blood sugar in the evening. Diagnosis Diabetes Type 2, ICD-10 code E11.9  . Calcium-Vitamin D-Vitamin K (VIACTIV PO) Take by mouth.  . Cholecalciferol (VITAMIN D) 2000 UNITS CAPS Take 2,000 Units by mouth daily.   . diclofenac sodium (VOLTAREN) 1 % GEL Apply topically 4 (four) times daily.  . fexofenadine (ALLEGRA) 180 MG tablet Take 1 tablet (180 mg total) by mouth daily.  . Multiple Vitamin (MULTIVITAMIN) tablet Take 1 tablet by mouth daily.    . mupirocin ointment (BACTROBAN) 2 % Apply to inside of each nares daily for 10 days then twice a week for maintenance.  . nebivolol (BYSTOLIC) 10 MG tablet Take 2 tablets (20 mg total) by mouth daily.  . verapamil (CALAN-SR) 120 MG CR tablet Take 120 mg by mouth.  . metFORMIN (GLUCOPHAGE-XR) 500 MG 24 hr tablet Take 1 tablet (500 mg total) by mouth daily with breakfast. (Patient not taking: Reported on 09/11/2014)  . [DISCONTINUED] verapamil (CALAN-SR) 120 MG CR tablet Take 1 tablet (120 mg total) by mouth 2 (two) times daily.   No facility-administered encounter medications on file as  of 09/11/2014.  :  Review of Systems:  Out of a complete 14 point review of systems, all are reviewed and negative with the exception of these symptoms as listed below:   Review of Systems  Neurological:       Patient is here to discuss results of MRI, patient reports numbness, pain, and tingling in both feet. Reports hip and back pain.     Objective:  Neurologic Exam  Physical Exam Physical Examination:   Filed Vitals:   09/11/14 1525  BP: 152/78  Pulse: 72  Resp: 16    General Examination: The patient is a very pleasant 54 y.o. female in no acute distress. She appears well-developed and well-nourished and well groomed. She is in good spirits today.  HEENT: Normocephalic, atraumatic, pupils are equal, round and reactive to light and accommodation. Extraocular tracking is good without limitation to gaze excursion or nystagmus noted. Normal smooth pursuit is noted. Hearing is grossly intact. Face is symmetric with normal facial animation and normal facial sensation. Speech is clear with no dysarthria noted. There is no hypophonia. There is no lip, neck/head, jaw or voice tremor. Neck is supple with full range of passive and active motion. There are no carotid bruits on auscultation. Oropharynx exam reveals: mild mouth dryness, adequate dental hygiene and moderate airway crowding, due to larger tongue, larger uvula, and tonsils in place. Mallampati is class II. Tongue protrudes centrally and palate elevates symmetrically. Nasal inspection reveals no significant nasal mucosal bogginess or redness and no septal deviation.   Chest: Clear to auscultation without wheezing, rhonchi or crackles noted.  Heart: S1+S2+0, regular and normal without murmurs, rubs or gallops noted.   Abdomen: Soft, non-tender and non-distended with normal bowel sounds appreciated on auscultation.  Extremities: There is 1+ to 2+ pitting edema in the distal lower extremity bilaterally, mostly in her feet.   Skin:  Warm and dry without trophic changes noted. There are no varicose veins.  Musculoskeletal: exam reveals no obvious joint deformities, tenderness or joint swelling or erythema.   Neurologically:  Mental status: The patient is awake, alert and oriented in all 4 spheres. Her immediate and remote memory, attention, language skills and fund of knowledge are appropriate. There is no evidence of aphasia, agnosia, apraxia or anomia. Speech is clear with normal prosody and enunciation. Thought  process is linear. Mood is normal and affect is normal.  Cranial nerves II - XII are as described above under HEENT exam. In addition: shoulder shrug is normal with equal shoulder height noted. Motor exam: Normal bulk, strength and tone is noted. There is no drift, tremor or rebound. Romberg is negative. Reflexes are 2+ throughout. Babinski: Toes are flexor bilaterally. Fine motor skills and coordination: intact with normal finger taps, normal hand movements, normal rapid alternating patting, normal foot taps and normal foot agility.  Cerebellar testing: No dysmetria or intention tremor on finger to nose testing. Heel to shin is unremarkable bilaterally. There is no truncal or gait ataxia.  Sensory exam: intact to light touch, pinprick, vibration, temperature sense in the upper extremities and lower extremities with the exception of mild decrease in pinprick and temperature sense in the toes, b/l, largely unchanged from before. She reports less pain today.    Gait, station and balance: She stands easily. No veering to one side is noted. No leaning to one side is noted. Posture is age-appropriate and stance is narrow based. Gait shows normal stride length and normal pace. No problems turning are noted. She turns en bloc. Tandem walk is unremarkable.              Assessment and plan:   In summary, Donna Dyer is a very pleasant 54 year old female with an underlying medical history of hypertension, diabetes, obesity,  obstructive sleep apnea, hyperlipidemia, endometriosis, right hip pain, edema, goiter, low back pain, status post facet injection in 2/14, who  presents for follow-up consultation of her bilateral toe numbness and tingling of over 2-1/2 years duration. She has in the interim undergone EMG and nerve conduction testing which showed no evidence of widespread neuropathy but possible bilateral S1 radiculopathy, she also had a lumbar spine MRI which showed some degenerative changes and lower lumbar spine foraminal stenosis and possible bilateral S1 root impingements, but nothing sinister or surgical at this time. Her examination is stable. She has evidence of mild decrease in pinprick sensation in her toes. She may have mild or beginning stages of neuropathy. She is at risk for diabetes and has been treated for prediabetes. She could not tolerate metformin. She does have a borderline A1c. She had EMG and nerve conduction testing in November 2014. Today, we discussed her test results in detail. I reassured her that her exam is otherwise benign. I suggested we follow her clinically. She is advised to try to lose weight and pursue strict blood sugar control. She had in the past extensive blood work through different doctors including through rheumatology and hematology. At this juncture, I do not think we need to add any new tests. She has a history of MGUS And has seen hematology for this. Her lower extremity swelling  appears to be a little better today. She does report that she has been reducing her verapamil as this may be causing her swelling. In the past, as I understand, she had Doppler ultrasound of her lower extremities and was told that she did not have any blood clots. at this point, I will see her back in about 8 months, sooner if the need arises. For symptomatic treatment of her foot pain she has tried gabapentin but had side effects. I suggested no new medications at this moment. I answered all her questions  today and the patient was in agreement with the above. I spent 20 minutes in total face-to-face time with the patient, more than 50%  of which was spent in counseling and coordination of care, reviewing test results, reviewing medication and discussing or reviewing the diagnosis of neuropathy, its prognosis and treatment options.

## 2014-09-11 NOTE — Patient Instructions (Signed)
We will continue to monitor your symptoms. You may have the beginning symptoms of neuropathy, nerve damage, from pre-diabetes.   Try to lose weight, watch your blood sugar and A1c numbers.   Follow up in 8 months.

## 2014-09-16 ENCOUNTER — Ambulatory Visit: Payer: Commercial Managed Care - PPO | Admitting: Family Medicine

## 2014-10-02 ENCOUNTER — Other Ambulatory Visit: Payer: Self-pay | Admitting: Obstetrics and Gynecology

## 2014-10-06 ENCOUNTER — Ambulatory Visit: Payer: Self-pay | Admitting: Neurology

## 2014-10-06 ENCOUNTER — Ambulatory Visit: Payer: Commercial Managed Care - PPO | Admitting: Neurology

## 2014-10-06 LAB — CYTOLOGY - PAP

## 2014-10-20 ENCOUNTER — Other Ambulatory Visit: Payer: Self-pay | Admitting: Family Medicine

## 2014-11-21 ENCOUNTER — Ambulatory Visit (INDEPENDENT_AMBULATORY_CARE_PROVIDER_SITE_OTHER): Payer: Commercial Managed Care - PPO | Admitting: Family Medicine

## 2014-11-21 ENCOUNTER — Encounter: Payer: Self-pay | Admitting: Family Medicine

## 2014-11-21 VITALS — BP 161/69 | HR 64 | Temp 98.5°F | Resp 18 | Wt 223.2 lb

## 2014-11-21 DIAGNOSIS — R252 Cramp and spasm: Secondary | ICD-10-CM

## 2014-11-21 DIAGNOSIS — I10 Essential (primary) hypertension: Secondary | ICD-10-CM

## 2014-11-21 DIAGNOSIS — E781 Pure hyperglyceridemia: Secondary | ICD-10-CM | POA: Diagnosis not present

## 2014-11-21 DIAGNOSIS — E119 Type 2 diabetes mellitus without complications: Secondary | ICD-10-CM

## 2014-11-21 MED ORDER — NEBIVOLOL HCL 20 MG PO TABS: 20.0000 mg | ORAL_TABLET | Freq: Every day | ORAL | Status: DC

## 2014-11-21 NOTE — Patient Instructions (Signed)
Can try B6 for leg cramps.

## 2014-11-21 NOTE — Progress Notes (Signed)
   Subjective:    Patient ID: Donna Dyer, female    DOB: 10-24-1960, 54 y.o.   MRN: [id number]  HPI Hypertension- Pt denies chest pain, SOB, dizziness, or heart palpitations. She has had a headache the last few days.   Taking meds as directed w/o problems.  Denies medication side effects.    Diabetes - no hypoglycemic events. No wounds or sores that are not healing well. No increased thirst or urination. Checking glucose at home. Taking medications as prescribed without any side effects. She does well if she takes in the evening. She has lost about 3 lbs.   Saw her rheumatologist last Friday for PMR and BP was up then in the 150 range.   Dr. Hartford Poli started her on Invokana and she is doing well.  Last A1C was 6.4   Review of Systems     Objective:   Physical Exam  Constitutional: She is oriented to person, place, and time. She appears well-developed and well-nourished.  HENT:  Head: Normocephalic and atraumatic.  Cardiovascular: Normal rate, regular rhythm and normal heart sounds.   Pulmonary/Chest: Effort normal and breath sounds normal.  Neurological: She is alert and oriented to person, place, and time.  Skin: Skin is warm and dry.  Psychiatric: She has a normal mood and affect. Her behavior is normal.          Assessment & Plan:  HTN - uncontrolled. Increase the bystolic back to 20mg  and F/U in 4 weeks.   DM- last A1c with Dr. Oren Binet at [facility] was 6.4 on 10/10/2014. So far doing well on the Invokana. Has had a slight increase in cramping. Next  Muscle cramps-consider trial of B6. She says she or he takes a B complex. Encouraged her just to double check to see if it does have B6 and it.   Hyperlipidemia- now on 10mg  Lipitor as recommended by Dr. Oren Binet. She's tolerating it well so far. Medication added to her list..

## 2014-12-05 ENCOUNTER — Ambulatory Visit (HOSPITAL_BASED_OUTPATIENT_CLINIC_OR_DEPARTMENT_OTHER): Payer: Commercial Managed Care - PPO | Admitting: Hematology & Oncology

## 2014-12-05 ENCOUNTER — Ambulatory Visit (HOSPITAL_BASED_OUTPATIENT_CLINIC_OR_DEPARTMENT_OTHER): Payer: Commercial Managed Care - PPO

## 2014-12-05 ENCOUNTER — Encounter: Payer: Self-pay | Admitting: Hematology & Oncology

## 2014-12-05 VITALS — BP 158/77 | HR 70 | Temp 97.8°F | Resp 16 | Ht 66.0 in | Wt 222.0 lb

## 2014-12-05 DIAGNOSIS — D472 Monoclonal gammopathy: Secondary | ICD-10-CM

## 2014-12-05 DIAGNOSIS — D509 Iron deficiency anemia, unspecified: Secondary | ICD-10-CM

## 2014-12-05 DIAGNOSIS — D500 Iron deficiency anemia secondary to blood loss (chronic): Secondary | ICD-10-CM

## 2014-12-05 LAB — CBC WITH DIFFERENTIAL (CANCER CENTER ONLY)
BASO#: 0 10*3/uL (ref 0.0–0.2)
BASO%: 0.1 % (ref 0.0–2.0)
EOS%: 1.5 % (ref 0.0–7.0)
Eosinophils Absolute: 0.1 10*3/uL (ref 0.0–0.5)
HCT: 38.5 % (ref 34.8–46.6)
HGB: 12.4 g/dL (ref 11.6–15.9)
LYMPH#: 2.4 10*3/uL (ref 0.9–3.3)
LYMPH%: 31.1 % (ref 14.0–48.0)
MCH: 26.8 pg (ref 26.0–34.0)
MCHC: 32.2 g/dL (ref 32.0–36.0)
MCV: 83 fL (ref 81–101)
MONO#: 0.5 10*3/uL (ref 0.1–0.9)
MONO%: 5.7 % (ref 0.0–13.0)
NEUT#: 4.8 10*3/uL (ref 1.5–6.5)
NEUT%: 61.6 % (ref 39.6–80.0)
Platelets: 302 10*3/uL (ref 145–400)
RBC: 4.63 10*6/uL (ref 3.70–5.32)
RDW: 14.1 % (ref 11.1–15.7)
WBC: 7.8 10*3/uL (ref 3.9–10.0)

## 2014-12-05 LAB — IRON AND TIBC CHCC
%SAT: 13 % — ABNORMAL LOW (ref 21–57)
Iron: 40 ug/dL — ABNORMAL LOW (ref 41–142)
TIBC: 314 ug/dL (ref 236–444)
UIBC: 274 ug/dL (ref 120–384)

## 2014-12-05 LAB — COMPREHENSIVE METABOLIC PANEL (CC13)
ALBUMIN: 3.9 g/dL (ref 3.5–5.0)
ALK PHOS: 158 U/L — AB (ref 40–150)
ALT: 23 U/L (ref 0–55)
AST: 22 U/L (ref 5–34)
Anion Gap: 12 mEq/L — ABNORMAL HIGH (ref 3–11)
BILIRUBIN TOTAL: 0.58 mg/dL (ref 0.20–1.20)
BUN: 12.8 mg/dL (ref 7.0–26.0)
CALCIUM: 9.4 mg/dL (ref 8.4–10.4)
CO2: 22 mEq/L (ref 22–29)
Chloride: 104 mEq/L (ref 98–109)
Creatinine: 0.9 mg/dL (ref 0.6–1.1)
EGFR: 84 mL/min/{1.73_m2} — ABNORMAL LOW (ref >90)
Glucose: 112 mg/dl (ref 70–140)
POTASSIUM: 3.8 meq/L (ref 3.5–5.1)
Sodium: 139 mEq/L (ref 136–145)
Total Protein: 8.5 g/dL — ABNORMAL HIGH (ref 6.4–8.3)

## 2014-12-05 LAB — FERRITIN CHCC: Ferritin: 65 ng/ml (ref 9–269)

## 2014-12-05 NOTE — Progress Notes (Signed)
Hematology and Oncology Follow Up Visit  Donna LAFOLLETTE [id number] 12/29/1960 54 y.o. 12/05/2014   Principle Diagnosis:  IgG kappa monoclonal gammopathy of undetermined significance. 2. Anemia - possible iron deficiency.  Current Therapy:    Observation     Interim History:  [name] is back for followup. She is doing well. Her husband had a bone marrow transplant back in March for myelofibrosis. He seems to be improving.  She had a good Thanksgiving. It was a fairly quiet Thanksgiving with her husband still recovering from the transplant.  Her iron studies in the past have been a little bit on the lower side. She is taking oral iron.  Her monoclonal studies done a year ago showed a monoclonal spike of 1.34 g/dL. Her IgG level was 2300 mg/dL. Her Kappa Lightchain was 7.24 mg/dL. All this is holding very steady.  She has no bone pain. There's been no change in bowel bladder habits. She's had no nausea or vomiting. She's had no rashes. She's had a little bit of leg swelling, probably because of her medications.  Overall, her performance status is ECOG 0.  Medications:  Current outpatient prescriptions:  .  Acetaminophen (TYLENOL EXTRA STRENGTH PO), Take 2 tablets by mouth as needed., Disp: , Rfl:  .  AMBULATORY NON FORMULARY MEDICATION, One touch ultra strips and lancets for the One Touch Mini. Check fasting blood sugar in the morning and random blood sugar in the evening. Diagnosis Diabetes Type 2, ICD-10 code E11.9, Disp: 3 each, Rfl: 3 .  atorvastatin (LIPITOR) 10 MG tablet, TAKE ONE TABLET (10 MG TOTAL) BY MOUTH DAILY., Disp: , Rfl: 5 .  Calcium-Vitamin D-Vitamin K (VIACTIV PO), Take by mouth., Disp: , Rfl:  .  Cholecalciferol (VITAMIN D) 2000 UNITS CAPS, Take 2,000 Units by mouth daily. , Disp: , Rfl:  .  diclofenac sodium (VOLTAREN) 1 % GEL, Apply topically 4 (four) times daily., Disp: , Rfl:  .  fexofenadine (ALLEGRA) 180 MG tablet, Take 1 tablet (180 mg total) by mouth  daily., Disp: 90 tablet, Rfl: 1 .  INVOKANA 100 MG TABS tablet, TAKE ONE TABLET (100 MG TOTAL) BY MOUTH DAILY., Disp: , Rfl: 5 .  Multiple Vitamin (MULTIVITAMIN) tablet, Take 1 tablet by mouth daily.  , Disp: , Rfl:  .  mupirocin ointment (BACTROBAN) 2 %, Apply to inside of each nares daily for 10 days then twice a week for maintenance., Disp: 30 g, Rfl: 0 .  Nebivolol HCl 20 MG TABS, Take 1 tablet (20 mg total) by mouth daily., Disp: 30 tablet, Rfl: 5 .  verapamil (CALAN-SR) 120 MG CR tablet, Take 120 mg by mouth., Disp: , Rfl:   Allergies:  Allergies  Allergen Reactions  . Pineapple Itching and Swelling    Tongue swells  . Onglyza [Saxagliptin] Itching  . Trulicity [Dulaglutide] Itching    ? Hypersensitivity reaction  . Amlodipine Besylate Other (See Comments)    muscle cramps  . Azithromycin Itching and Rash  . Chlorthalidone Other (See Comments)    Muscle cramps  . Codeine Nausea And Vomiting  . Hctz [Hydrochlorothiazide] Other (See Comments)    Muscle spasms    . Metformin And Related Diarrhea  . Sulfonamide Derivatives Rash    Past Medical History, Surgical history, Social history, and Family History were reviewed and updated.  Review of Systems: As above  Physical Exam:  height is 5\' 6"  (1.676 m) and weight is 222 lb (100.699 kg). Her oral temperature is 97.8 F (36.6  C). Her blood pressure is 158/77 and her pulse is 70. Her respiration is 16.   Obese African-American female in no obvious distress. Head and neck exam shows no ocular or oral lesions. Pupils reacted properly. Lungs are clear. Cardiac exam regular rate and rhythm with no murmurs rubs or bruits. Abdomen soft. She is obese. Good bowel sounds. There is no fluid wave. There is no palpable liver or spleen tip. Back exam shows no tenderness over the spine ribs or hips. Extremities shows some 1+ edema in her lower legs. She has no joint swelling or erythema. Muscle strength is adequate. Strength is good. Skin exam  shows  no rashes ecchymoses or petechia. Neurological exam shows no focal neurological deficits.  Lab Results  Component Value Date   WBC 7.8 12/05/2014   HGB 12.4 12/05/2014   HCT 38.5 12/05/2014   MCV 83 12/05/2014   PLT 302 12/05/2014     Chemistry      Component Value Date/Time   NA 138 04/11/2014 0745   NA 142 04/26/2013 0805   K 3.9 04/11/2014 0745   K 3.9 04/26/2013 0805   CL 103 04/11/2014 0745   CL 101 04/26/2013 0805   CO2 25 04/11/2014 0745   CO2 30 04/26/2013 0805   BUN 14 04/11/2014 0745   BUN 16 04/26/2013 0805   CREATININE 0.89 04/11/2014 0745   CREATININE 0.90 11/29/2013 1150      Component Value Date/Time   CALCIUM 8.9 04/11/2014 0745   CALCIUM 9.0 04/26/2013 0805   ALKPHOS 110 03/04/2014 1417   ALKPHOS 115* 04/26/2013 0805   AST 16 03/04/2014 1417   AST 20 04/26/2013 0805   ALT 16 03/04/2014 1417   ALT 12 04/26/2013 0805   BILITOT 0.4 03/04/2014 1417   BILITOT 0.80 04/26/2013 0805       Impression and Plan: Donna Dyer is 54 year old African American female. She has an IgG kappa monoclonal spike. I think this is an MGUS.  Her anemia has resolved. Iron certainly has helped her.  For now, I think we can have her come back in 6 months. I don't think we have to do a bone survey given the fact that her blood counts looked okay. We will see what her monoclonal studies show but I would be surprised if they are different in a negative way.  It sounds like her husband is doing quite well after his allogeneic transplant for myelofibrosis.   [name], MD 11/25/201611:41 AM

## 2014-12-08 ENCOUNTER — Telehealth: Payer: Self-pay | Admitting: *Deleted

## 2014-12-08 ENCOUNTER — Other Ambulatory Visit: Payer: Self-pay | Admitting: *Deleted

## 2014-12-08 DIAGNOSIS — D509 Iron deficiency anemia, unspecified: Secondary | ICD-10-CM

## 2014-12-08 NOTE — Telephone Encounter (Addendum)
Patient aware of results. Appointment made.   ----- Message from [name], MD sent at 12/05/2014  4:06 PM EST ----- Call - Iron is actually low!!  Need 1 dose of Feraheme!!  Please set this up for 2-3 weeks!!  Laurey Arrow

## 2014-12-10 LAB — PROTEIN ELECTROPHORESIS, SERUM, WITH REFLEX
ALBUMIN ELP: 4 g/dL (ref 3.8–4.8)
Abnormal Protein Band1: 1.5 g/dL
Alpha-1-Globulin: 0.4 g/dL — ABNORMAL HIGH (ref 0.2–0.3)
Alpha-2-Globulin: 0.8 g/dL (ref 0.5–0.9)
BETA 2: 0.5 g/dL (ref 0.2–0.5)
Beta Globulin: 0.5 g/dL (ref 0.4–0.6)
GAMMA GLOBULIN: 2.1 g/dL — AB (ref 0.8–1.7)
Total Protein, Serum Electrophoresis: 8.2 g/dL — ABNORMAL HIGH (ref 6.1–8.1)

## 2014-12-10 LAB — IFE INTERPRETATION

## 2014-12-10 LAB — KAPPA/LAMBDA LIGHT CHAINS
KAPPA LAMBDA RATIO: 11.61 — AB (ref 0.26–1.65)
Kappa free light chain: 13.7 mg/dL — ABNORMAL HIGH (ref 0.33–1.94)
LAMBDA FREE LGHT CHN: 1.18 mg/dL (ref 0.57–2.63)

## 2014-12-10 LAB — IGG, IGA, IGM
IGA: 278 mg/dL (ref 69–380)
IGM, SERUM: 77 mg/dL (ref 52–322)
IgG (Immunoglobin G), Serum: 2390 mg/dL — ABNORMAL HIGH (ref 690–1700)

## 2014-12-10 LAB — LACTATE DEHYDROGENASE: LDH: 161 U/L (ref 94–250)

## 2014-12-16 ENCOUNTER — Ambulatory Visit: Payer: Commercial Managed Care - PPO

## 2014-12-19 ENCOUNTER — Ambulatory Visit (INDEPENDENT_AMBULATORY_CARE_PROVIDER_SITE_OTHER): Payer: Commercial Managed Care - PPO | Admitting: Family Medicine

## 2014-12-19 ENCOUNTER — Encounter: Payer: Self-pay | Admitting: Family Medicine

## 2014-12-19 ENCOUNTER — Ambulatory Visit: Payer: Commercial Managed Care - PPO | Admitting: Family Medicine

## 2014-12-19 VITALS — BP 160/84 | HR 66 | Wt 222.0 lb

## 2014-12-19 DIAGNOSIS — I10 Essential (primary) hypertension: Secondary | ICD-10-CM

## 2014-12-19 DIAGNOSIS — H9201 Otalgia, right ear: Secondary | ICD-10-CM

## 2014-12-19 NOTE — Patient Instructions (Signed)
Restart your flonase 2 sprays in each nostril daily for one week and then decrease to 1 squirt in each nostril.  Increase verapamil to 180 mg daily.  If BP better after 3 weeks then we can decrease you bystolic to 10mg .

## 2014-12-19 NOTE — Progress Notes (Signed)
   Subjective:    Patient ID: Donna Dyer, female    DOB: 07/24/1960, 54 y.o.   MRN: [id number]  HPI Hypertension- Pt denies chest pain, SOB, dizziness, or heart palpitations.  Taking meds as directed w/o problems.  Denies medication side effects.   We did increase the Bystolic 20 mg when I last saw her. She has to home blood pressure cuffs. A arm cuff anda wrist cuff and brought bothof those in today. One was readin a little higher and one was reading a little lower than the pressure that we got here. Most of her home blood pressures have been running in the15s and 160s over 80s and low 90s.   Right ear pain -  She says a couple days ago she got very sharp pain in her right ear while driving. She also has hada lttle bitmore nasal pressure and congeston the last couple of days. No significant drainage or cough. No fevers chills or sweats. There have been several eople have been sickat work.  Review of Systems     Objective:   Physical Exam  Constitutional: She is oriented to person, place, and time. She appears well-developed and well-nourished.  HENT:  Head: Normocephalic and atraumatic.  Right Ear: External ear normal.  Left Ear: External ear normal.  Nose: Nose normal.  Mouth/Throat: Oropharynx is clear and moist.  TMs and canals are clear. Turbinates are pale ans swollen bilaterally  Eyes: Conjunctivae and EOM are normal. Pupils are equal, round, and reactive to light.  Neck: Neck supple. No thyromegaly present.  Cardiovascular: Normal rate, regular rhythm and normal heart sounds.   Pulmonary/Chest: Effort normal and breath sounds normal. She has no wheezes.  Lymphadenopathy:    She has no cervical adenopathy.  Neurological: She is alert and oriented to person, place, and time.  Skin: Skin is warm and dry.  Psychiatric: She has a normal mood and affect.          Assessment & Plan:  hypertenion- her blood pressure was much better controlld whenshe was on increasd dose of  verapamil but unfortunately she tarted swelng. At one point time we had her on 180 mg once a day instead of twice a day and she was not expensing swelling at that time.  Right now she is on 120 mgand we are going to increase her back 280 mg. She think she still has some tabs at home and so will let us know if she needs a refill. If she does well on this and we can conside decrease the Bystolic back own to 10 mg as I really don't see a significant difference between 10 mg dose and 20 mg dose on her blood pressures.   right otalgia- possibly  Related to allergic rhinitis-her turbinates are swollen on exam today. Recommend she restart her fluticasone and call us if she' not feeling better in 5-10 ays. Call sooner if she feels lik she's getting worse or develos a fvr.

## 2014-12-22 ENCOUNTER — Encounter: Payer: Self-pay | Admitting: Family Medicine

## 2014-12-23 ENCOUNTER — Ambulatory Visit (HOSPITAL_BASED_OUTPATIENT_CLINIC_OR_DEPARTMENT_OTHER): Payer: Commercial Managed Care - PPO

## 2014-12-23 VITALS — BP 151/69 | HR 57 | Temp 98.2°F | Resp 18

## 2014-12-23 DIAGNOSIS — D509 Iron deficiency anemia, unspecified: Secondary | ICD-10-CM | POA: Diagnosis not present

## 2014-12-23 MED ORDER — SODIUM CHLORIDE 0.9 % IV SOLN
510.0000 mg | Freq: Once | INTRAVENOUS | Status: AC
  Administered 2014-12-23: 510 mg via INTRAVENOUS
  Filled 2014-12-23: qty 17

## 2014-12-23 MED ORDER — SODIUM CHLORIDE 0.9 % IV SOLN
INTRAVENOUS | Status: DC
Start: 2014-12-23 — End: 2014-12-23
  Administered 2014-12-23: 13:00:00 via INTRAVENOUS

## 2014-12-23 NOTE — Patient Instructions (Signed)

## 2014-12-25 ENCOUNTER — Other Ambulatory Visit: Payer: Self-pay | Admitting: Family Medicine

## 2014-12-26 MED ORDER — VERAPAMIL HCL ER 120 MG PO TBCR: 120.0000 mg | EXTENDED_RELEASE_TABLET | Freq: Every day | ORAL | Status: DC

## 2015-01-01 ENCOUNTER — Other Ambulatory Visit: Payer: Self-pay | Admitting: Family Medicine

## 2015-01-01 MED ORDER — NEBIVOLOL HCL 10 MG PO TABS: 10.0000 mg | ORAL_TABLET | Freq: Every day | ORAL | Status: DC

## 2015-01-01 NOTE — Telephone Encounter (Signed)
Needed Rx sent to mail order.

## 2015-01-02 ENCOUNTER — Other Ambulatory Visit: Payer: Self-pay | Admitting: Family Medicine

## 2015-01-02 MED ORDER — NEBIVOLOL HCL 10 MG PO TABS: 20.0000 mg | ORAL_TABLET | Freq: Every day | ORAL | Status: DC

## 2015-01-02 MED ORDER — VERAPAMIL HCL ER 180 MG PO CP24: 180.0000 mg | ORAL_CAPSULE | Freq: Every day | ORAL | Status: DC

## 2015-01-06 LAB — HEMOGLOBIN A1C: HEMOGLOBIN A1C: 6

## 2015-01-16 LAB — LIPID PANEL
CHOLESTEROL: 145 mg/dL (ref 0–200)
HDL: 52 mg/dL (ref 35–70)
LDL Cholesterol: 55 mg/dL
TRIGLYCERIDES: 191 mg/dL — AB (ref 40–160)

## 2015-02-03 ENCOUNTER — Encounter: Payer: Self-pay | Admitting: Family Medicine

## 2015-05-12 ENCOUNTER — Ambulatory Visit (INDEPENDENT_AMBULATORY_CARE_PROVIDER_SITE_OTHER): Payer: Commercial Managed Care - PPO | Admitting: Neurology

## 2015-05-12 ENCOUNTER — Encounter: Payer: Self-pay | Admitting: Neurology

## 2015-05-12 VITALS — BP 146/78 | HR 72 | Resp 16 | Ht 66.0 in | Wt 227.0 lb

## 2015-05-12 DIAGNOSIS — R208 Other disturbances of skin sensation: Secondary | ICD-10-CM | POA: Diagnosis not present

## 2015-05-12 DIAGNOSIS — M5417 Radiculopathy, lumbosacral region: Secondary | ICD-10-CM | POA: Diagnosis not present

## 2015-05-12 DIAGNOSIS — R600 Localized edema: Secondary | ICD-10-CM

## 2015-05-12 DIAGNOSIS — R7303 Prediabetes: Secondary | ICD-10-CM

## 2015-05-12 DIAGNOSIS — R200 Anesthesia of skin: Secondary | ICD-10-CM

## 2015-05-12 NOTE — Progress Notes (Signed)
Subjective:    Patient ID: Donna Dyer is a 55 y.o. female.  HPI     Interim history:  Donna Dyer is a 55 year old right-handed woman with an underlying medical history of hypertension, diabetes, obesity, obstructive sleep apnea, hyperlipidemia, endometriosis, right hip pain, edema, goiter, low back pain, status post facet injection in 2/14, who presents for follow-up consultation of her lower extremity numbness and pain. The patient is unaccompanied today. I last saw her on 09/11/2014, at which time she reported no significant change in her symptoms. She reported toe numbness and sometimes tingling. She had seen a foot doctor and was told she had tendinitis of the left foot. She was not able to wear the boot as it was pressing on her leg. She was treated for prediabetes with metformin but had diarrhea with it. Her most recent A1c from June 2016 was 6.3. We talked about her prior test results including lumbar spine MRI, EMG and nerve conduction testing, and blood test results.  Today, 05/12/2015: She reports feeling about the same, saw foot doctor, had PT, uses heel inserts. Now on Invokana since 11/16, latest A1c in 12/16 was 6.0. She has some LBP and some radiation to the R>L, occasional muscle cramps in the hamstring and calves.   Previously:  I first met her on 06/26/2014 at the request of her primary care physician, at which time the patient reported bilateral toe numbness and toe pain especially from the balls of her feet forward, for the past 2-1/2 years or so. I suggested we bring her back for EMG and NCV testing. She had this on 07/18/2014: Nerve conduction studies done on both lower extremities were unremarkable. No evidence of a peripheral neuropathy was seen. EMG evaluation of both lower extremities reveals evidence of acute denervation most consistent with bilateral S1 radiculopathies. I suggested we proceed with a lumbar spine MRI. She had a L-spine MRI without contrast on  08/20/2014: Abnormal MRI lumbar spine (without) demonstrating: 1. At L5-S1: disc bulging with tiny disc protrusions with potential contact upon the descending bilateral S1 roots 2. At L4-5: far left lateral disc bulging with moderate-severe left foraminal stenosis  06/26/2014: Symptoms are mildly progressive. She feels that she has less sensation in her toes. She worries about blood circulation. She has had lower extremity edema for the past 2+ years. She uses compression stockings. In the past, she had Doppler ultrasound testing of her lower extremities with reports unavailable for me today but she reports that she had no blood clots. She had echocardiogram in 2012 which was unremarkable. For pain in her toes she tries occasional arthritis strength Tylenol. In the past she was tried on gabapentin but as she was titrated up on it she started having more and more side effects including mood related side effects and dizziness and had to stop it. She's never had low back surgery. She denies any radiating pain currently but has ongoing right hip pain. She had L4-5 facet injection in February 2014. She had an EMG nerve conduction test with the left lower extremity in [place] at [facility] on 11/15/2013 which was a normal study. I reviewed the report. She has been seeing rheumatology, Dr. Estanislado Pandy, and has had blood work extensively through her. In September 2015, her vitamin B12 level was 604. She has a history of MGUS and has seen Dr. Marin Olp in hematology.   She had a lumbar spine MRI without contrast on 02/20/2012 and I reviewed the report: Mild lumbar spondylosis with L4-5  left foraminal protrusion of uncertain significant in this patient with right hip symptoms. Small L5-S1 central protrusion without stenosis. Compared to prior the small central L5-S1 disc protrusion without stenosis is new.   Her main concern is ongoing toe numbness and toe pain bilaterally. She has been trying to lose weight. Her  weight fluctuates. She was diagnosed with obstructive sleep apnea some 3 years ago and says she uses a CPAP machine regularly.  Her Past Medical History Is Significant For: Past Medical History  Diagnosis Date  . Hyperlipidemia   . Hypertension   . Anemia   . Endometriosis   . Pain, joint, hip, right     chronic- Dr Alvan Dame Dr Nelva Bush  . Edema     LLE- podiatry in [place]  . Diabetes mellitus   . Goiter   . Hemorrhoids   . Lumbosacral radiculopathy at S1 07/18/2014    Bilateral    Her Past Surgical History Is Significant For: Past Surgical History  Procedure Laterality Date  . Esophagogastroduodenoscopy  7-11    for anemia  . Total abdominal hysterectomy  1995    bilat oophorectomy/ endometriosis  . Flexible sigmoidoscopy  01/31/2011    Procedure: FLEXIBLE SIGMOIDOSCOPY;  Surgeon: Missy Sabins, MD;  Location: [facility];  Service: Endoscopy;  Laterality: N/A;  . Vulvar lesion removal  09/07/2011    Procedure: VULVAR LESION;  Surgeon: Alvino Chapel, MD;  Location[facility];  Service: Gynecology;  Laterality: N/A;  vaginal lesion  . Back injections      Her Family History Is Significant For: Family History  Problem Relation Age of Onset  . Hyperlipidemia Mother   . Cancer Father     bladder  . Heart attack Father     MI at age 63  . Diabetes Sister   . Diabetes Brother   . Diabetes Brother   . Diabetes Brother   . Diabetes Brother   . Sarcoidosis Brother   . Sarcoidosis Sister   . Anesthesia problems Neg Hx   . Hypotension Neg Hx   . Malignant hyperthermia Neg Hx   . Pseudochol deficiency Neg Hx     Her Social History Is Significant For: Social History   Social History  . Marital Status: Married    Spouse Name: N/A  . Number of Children: 2  . Years of Education: N/A   Occupational History  . 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 Smoking status: Never Smoker   . Smokeless tobacco: Never Used  .  Alcohol Use: No     Comment: Doesn't drink   . Drug Use: No  . Sexual Activity: Yes    Birth Control/ Protection: Surgical   Other Topics Concern  . None   Social History Narrative   Lives in [place] with husband and daughter. Ambulatory, no cane or walker. Works at [employer].    [employer].   Right handed.   Caffeine none.    Her Allergies Are:  Allergies  Allergen Reactions  . Pineapple Itching and Swelling    Tongue swells  . Onglyza [Saxagliptin] Itching  . Trulicity [Dulaglutide] Itching    ? Hypersensitivity reaction  . Amlodipine Besylate Other (See Comments)    muscle cramps  . Azithromycin Itching and Rash  . Chlorthalidone Other (See Comments)    Muscle cramps  . Codeine Nausea And Vomiting  . Hctz [Hydrochlorothiazide] Other (See Comments)    Muscle spasms    .  Metformin And Related Diarrhea  . Sulfonamide Derivatives Rash  :   Her Current Medications Are:  Outpatient Encounter Prescriptions as of 05/12/2015  Medication Sig  . Acetaminophen (TYLENOL EXTRA STRENGTH PO) Take 2 tablets by mouth as needed.  . AMBULATORY NON FORMULARY MEDICATION One touch ultra strips and lancets for the One Touch Mini. Check fasting blood sugar in the morning and random blood sugar in the evening. Diagnosis Diabetes Type 2, ICD-10 code E11.9  . atorvastatin (LIPITOR) 10 MG tablet TAKE ONE TABLET (10 MG TOTAL) BY MOUTH DAILY.  . Calcium-Vitamin D-Vitamin K (VIACTIV PO) Take by mouth.  . Cholecalciferol (VITAMIN D) 2000 UNITS CAPS Take 2,000 Units by mouth daily.   . diclofenac sodium (VOLTAREN) 1 % GEL Apply topically 4 (four) times daily.  . fexofenadine (ALLEGRA) 180 MG tablet Take 1 tablet (180 mg total) by mouth daily.  . INVOKANA 100 MG TABS tablet TAKE ONE TABLET (100 MG TOTAL) BY MOUTH DAILY.  . Multiple Vitamin (MULTIVITAMIN) tablet Take 1 tablet by mouth daily.    . mupirocin ointment (BACTROBAN) 2 % Apply to inside of each nares daily for 10 days then  twice a week for maintenance.  . nebivolol (BYSTOLIC) 10 MG tablet Take 2 tablets (20 mg total) by mouth daily.  . verapamil (VERELAN PM) 180 MG 24 hr capsule Take 1 capsule (180 mg total) by mouth at bedtime.   No facility-administered encounter medications on file as of 05/12/2015.  :  Review of Systems:  Out of a complete 14 point review of systems, all are reviewed and negative with the exception of these symptoms as listed below:    Review of Systems  Neurological:       Patient c/o the numbness in her toes. States it is the same as last visit.     Objective:  Neurologic Exam  Physical Exam Physical Examination:   Filed Vitals:   05/12/15 1354  BP: 146/78  Pulse: 72  Resp: 16    General Examination: The patient is a very pleasant 55 y.o. female in no acute distress. She appears well-developed and well-nourished and well groomed. She is in good spirits today.  HEENT: Normocephalic, atraumatic, pupils are equal, round and reactive to light and accommodation. Extraocular tracking is good without limitation to gaze excursion or nystagmus noted. Normal smooth pursuit is noted. Hearing is grossly intact. Face is symmetric with normal facial animation and normal facial sensation. Speech is clear with no dysarthria noted. There is no hypophonia. There is no lip, neck/head, jaw or voice tremor. Neck is supple with full range of passive and active motion. There are no carotid bruits on auscultation. Oropharynx exam reveals: mild mouth dryness, adequate dental hygiene and moderate airway crowding. Mallampati is class II. Tongue protrudes centrally and palate elevates symmetrically.  Chest: Clear to auscultation without wheezing, rhonchi or crackles noted.  Heart: S1+S2+0, regular and normal without murmurs, rubs or gallops noted.   Abdomen: Soft, non-tender and non-distended with normal bowel sounds appreciated on auscultation.  Extremities: There is 1+ pitting edema in the distal  lower extremity bilaterally, mostly in her feet and around both ankles.  Skin: Warm and dry without trophic changes noted. There are no varicose veins.  Musculoskeletal: exam reveals no obvious joint deformities, tenderness or joint swelling or erythema.   Neurologically:  Mental status: The patient is awake, alert and oriented in all 4 spheres. Her immediate and remote memory, attention, language skills and fund of knowledge are appropriate. There is  no evidence of aphasia, agnosia, apraxia or anomia. Speech is clear with normal prosody and enunciation. Thought process is linear. Mood is normal and affect is normal.  Cranial nerves II - XII are as described above under HEENT exam. In addition: shoulder shrug is normal with equal shoulder height noted. Motor exam: Normal bulk, strength and tone is noted. There is no drift, tremor or rebound. Romberg is negative. Reflexes are 2+ throughout. Babinski: Toes are flexor bilaterally. Fine motor skills and coordination: intact with normal finger taps, normal hand movements, normal rapid alternating patting, normal foot taps and normal foot agility.  Cerebellar testing: No dysmetria or intention tremor on finger to nose testing. Heel to shin is unremarkable bilaterally. There is no truncal or gait ataxia.  Sensory exam: intact to light touch, pinprick, vibration, temperature sense in the upper extremities and lower extremities with the exception of mild decrease in pinprick and temperature sense in the toes, b/l, and dorsi of the feet, largely unchanged from before.     Gait, station and balance: She stands easily. No veering to one side is noted. No leaning to one side is noted. Posture is age-appropriate and stance is narrow based. Gait shows normal stride length and normal pace. No problems turning are noted.               Assessment and plan:   In summary, FANI ROTONDO is a very pleasant 55 year old female with an underlying medical history of  hypertension, diabetes, obesity, obstructive sleep apnea, hyperlipidemia, endometriosis, right hip pain, edema, goiter, low back pain, status post facet injection in 2/14, who presents for follow-up consultation of her bilateral toe numbness and tingling of over 2-1/2 to 3 years' duration with stable symptoms and exam. Workup wise, she had EMG and nerve conduction testing in July 2016 which showed no evidence of widespread neuropathy, possible bilateral S1 radiculopathy, she had lumbar spine MRI in August 2016 which showed some degenerative changes and lower lumbar spine foraminal stenosis and possible bilateral S1 root impingements, but nothing sinister or surgical at this time. Her examination has remained stable. We did blood work as well. A1c has come down from 6.3-6.0. She is on Inderal, at this time. She had prior EMG and nerve conduction testing in November 2014. We reviewed all her test results again today. She had also previous blood work under rheumatology and hematology and now sees an endocrinologist as well. At this juncture I suggested we monitor her symptoms and her exam. She is advised to follow-up in about 8 months, sooner if needed. I answered all her questions today and she was in agreement. I spent 25 minutes in total face-to-face time with the patient, more than 50% of which was spent in counseling and coordination of care, reviewing test results, reviewing medication and discussing or reviewing the diagnosis of neuropathy, its prognosis and treatment options.

## 2015-05-12 NOTE — Patient Instructions (Signed)
We will continue to monitor your symptoms and exam.  Work up and exam does not suggest a widespread neuropathy/nerve damage. Your A1c has also improved.

## 2015-06-05 ENCOUNTER — Other Ambulatory Visit (HOSPITAL_BASED_OUTPATIENT_CLINIC_OR_DEPARTMENT_OTHER): Payer: Commercial Managed Care - PPO

## 2015-06-05 ENCOUNTER — Ambulatory Visit (HOSPITAL_BASED_OUTPATIENT_CLINIC_OR_DEPARTMENT_OTHER): Payer: Commercial Managed Care - PPO | Admitting: Hematology & Oncology

## 2015-06-05 ENCOUNTER — Encounter: Payer: Self-pay | Admitting: Hematology & Oncology

## 2015-06-05 VITALS — BP 131/63 | HR 53 | Temp 97.9°F | Resp 16 | Ht 66.0 in | Wt 228.0 lb

## 2015-06-05 DIAGNOSIS — D472 Monoclonal gammopathy: Secondary | ICD-10-CM

## 2015-06-05 DIAGNOSIS — D508 Other iron deficiency anemias: Secondary | ICD-10-CM | POA: Insufficient documentation

## 2015-06-05 DIAGNOSIS — E119 Type 2 diabetes mellitus without complications: Secondary | ICD-10-CM

## 2015-06-05 DIAGNOSIS — D500 Iron deficiency anemia secondary to blood loss (chronic): Secondary | ICD-10-CM | POA: Diagnosis not present

## 2015-06-05 DIAGNOSIS — D649 Anemia, unspecified: Secondary | ICD-10-CM | POA: Diagnosis not present

## 2015-06-05 DIAGNOSIS — D509 Iron deficiency anemia, unspecified: Secondary | ICD-10-CM

## 2015-06-05 DIAGNOSIS — K649 Unspecified hemorrhoids: Secondary | ICD-10-CM

## 2015-06-05 DIAGNOSIS — K909 Intestinal malabsorption, unspecified: Secondary | ICD-10-CM

## 2015-06-05 HISTORY — DX: Monoclonal gammopathy: D47.2

## 2015-06-05 HISTORY — DX: Intestinal malabsorption, unspecified: K90.9

## 2015-06-05 HISTORY — DX: Other iron deficiency anemias: D50.8

## 2015-06-05 HISTORY — DX: Unspecified hemorrhoids: K64.9

## 2015-06-05 LAB — CBC WITH DIFFERENTIAL (CANCER CENTER ONLY)
BASO#: 0 10*3/uL (ref 0.0–0.2)
BASO%: 0.2 % (ref 0.0–2.0)
EOS%: 2 % (ref 0.0–7.0)
Eosinophils Absolute: 0.2 10*3/uL (ref 0.0–0.5)
HCT: 40.3 % (ref 34.8–46.6)
HGB: 13.5 g/dL (ref 11.6–15.9)
LYMPH#: 2.3 10*3/uL (ref 0.9–3.3)
LYMPH%: 27.2 % (ref 14.0–48.0)
MCH: 28.2 pg (ref 26.0–34.0)
MCHC: 33.5 g/dL (ref 32.0–36.0)
MCV: 84 fL (ref 81–101)
MONO#: 0.5 10*3/uL (ref 0.1–0.9)
MONO%: 6.4 % (ref 0.0–13.0)
NEUT%: 64.2 % (ref 39.6–80.0)
NEUTROS ABS: 5.4 10*3/uL (ref 1.5–6.5)
PLATELETS: 285 10*3/uL (ref 145–400)
RBC: 4.78 10*6/uL (ref 3.70–5.32)
RDW: 13.3 % (ref 11.1–15.7)
WBC: 8.5 10*3/uL (ref 3.9–10.0)

## 2015-06-05 LAB — COMPREHENSIVE METABOLIC PANEL
ALT: 20 U/L (ref 0–55)
ANION GAP: 11 meq/L (ref 3–11)
AST: 19 U/L (ref 5–34)
Albumin: 3.9 g/dL (ref 3.5–5.0)
Alkaline Phosphatase: 163 U/L — ABNORMAL HIGH (ref 40–150)
BILIRUBIN TOTAL: 0.63 mg/dL (ref 0.20–1.20)
BUN: 13.1 mg/dL (ref 7.0–26.0)
CO2: 24 meq/L (ref 22–29)
CREATININE: 1 mg/dL (ref 0.6–1.1)
Calcium: 9.2 mg/dL (ref 8.4–10.4)
Chloride: 102 mEq/L (ref 98–109)
EGFR: 73 mL/min/{1.73_m2} — ABNORMAL LOW (ref >90)
GLUCOSE: 135 mg/dL (ref 70–140)
Potassium: 3.8 mEq/L (ref 3.5–5.1)
Sodium: 137 mEq/L (ref 136–145)
TOTAL PROTEIN: 8.8 g/dL — AB (ref 6.4–8.3)

## 2015-06-05 LAB — FERRITIN: FERRITIN: 125 ng/mL (ref 9–269)

## 2015-06-05 LAB — CHCC SATELLITE - SMEAR

## 2015-06-05 LAB — IRON AND TIBC
%SAT: 13 % — AB (ref 21–57)
IRON: 38 ug/dL — AB (ref 41–142)
TIBC: 295 ug/dL (ref 236–444)
UIBC: 257 ug/dL (ref 120–384)

## 2015-06-05 NOTE — Progress Notes (Signed)
Hematology and Oncology Follow Up Visit  Donna STANISZEWSKI [id number] 1960-05-23 55 y.o. 06/05/2015   Principle Diagnosis:  IgG kappa monoclonal gammopathy of undetermined significance. 2. Anemia - possible iron deficiency.  Current Therapy:    Observation     Interim History:  [name] is back for followup. She sees be doing okay. We last saw her back in November. She had iron back in December. She does not absorb iron because of all of her medications. The I'm really has helped her.  Her blood sugars have not been monitored by herself. Measures to why. She does see a doctor for this.  Her last myeloma studies haven't Donna Dyer very steady. Her last M spike was 1.5 g/dL.  Her IgG level was 239 0 mg/dL. Her Kappa Lightchain was up a little bit at 13.7 mg/dL.  She did complain of some abdominal discomfort. This is in the per amp umbilical region. She does have a gastroenterologist but would like to change. I did give her the name of a another gastroenterologist that she can see.  There's not been any issues with fever. She had no cough. She is due for a mammogram I think in July.   She's had no rashes. Has not been any leg swelling.  Overall, her performance status is ECOG 0.  Medications:  Current outpatient prescriptions:  .  Acetaminophen (TYLENOL EXTRA STRENGTH PO), Take 2 tablets by mouth as needed., Disp: , Rfl:  .  AMBULATORY NON FORMULARY MEDICATION, One touch ultra strips and lancets for the One Touch Mini. Check fasting blood sugar in the morning and random blood sugar in the evening. Diagnosis Diabetes Type 2, ICD-10 code E11.9, Disp: 3 each, Rfl: 3 .  atorvastatin (LIPITOR) 10 MG tablet, TAKE ONE TABLET (10 MG TOTAL) BY MOUTH DAILY., Disp: , Rfl: 5 .  Calcium-Vitamin D-Vitamin K (VIACTIV PO), Take by mouth., Disp: , Rfl:  .  Cholecalciferol (VITAMIN D) 2000 UNITS CAPS, Take 2,000 Units by mouth daily. , Disp: , Rfl:  .  diclofenac sodium (VOLTAREN) 1 % GEL, Apply  topically 4 (four) times daily., Disp: , Rfl:  .  fexofenadine (ALLEGRA) 180 MG tablet, Take 1 tablet (180 mg total) by mouth daily., Disp: 90 tablet, Rfl: 1 .  INVOKANA 100 MG TABS tablet, TAKE ONE TABLET (100 MG TOTAL) BY MOUTH DAILY., Disp: , Rfl: 5 .  Multiple Vitamin (MULTIVITAMIN) tablet, Take 1 tablet by mouth daily.  , Disp: , Rfl:  .  mupirocin ointment (BACTROBAN) 2 %, Apply to inside of each nares daily for 10 days then twice a week for maintenance., Disp: 30 g, Rfl: 0 .  nebivolol (BYSTOLIC) 10 MG tablet, Take 2 tablets (20 mg total) by mouth daily., Disp: 180 tablet, Rfl: 1 .  verapamil (VERELAN PM) 180 MG 24 hr capsule, Take 1 capsule (180 mg total) by mouth at bedtime., Disp: 90 capsule, Rfl: 1  Allergies:  Allergies  Allergen Reactions  . Pineapple Itching and Swelling    Tongue swells  . Onglyza [Saxagliptin] Itching  . Trulicity [Dulaglutide] Itching    ? Hypersensitivity reaction  . Amlodipine Besylate Other (See Comments)    muscle cramps  . Azithromycin Itching and Rash  . Chlorthalidone Other (See Comments)    Muscle cramps  . Codeine Nausea And Vomiting  . Hctz [Hydrochlorothiazide] Other (See Comments)    Muscle spasms    . Metformin And Related Diarrhea  . Sulfonamide Derivatives Rash    Past Medical History, Surgical  history, Social history, and Family History were reviewed and updated.  Review of Systems: As above  Physical Exam:  height is 5\' 6"  (1.676 m) and weight is 228 lb (103.42 kg). Her oral temperature is 97.9 F (36.6 C). Her blood pressure is 131/63 and her pulse is 53. Her respiration is 16.   Obese African-American female in no obvious distress. Head and neck exam shows no ocular or oral lesions. Pupils reacted properly. Lungs are clear. Cardiac exam regular rate and rhythm with no murmurs rubs or bruits. Abdomen soft. She is obese. Good bowel sounds. There is no fluid wave. There is no palpable liver or spleen tip. Back exam shows no  tenderness over the spine ribs or hips. Extremities shows some 1+ edema in her lower legs. She has no joint swelling or erythema. Muscle strength is adequate. Strength is good. Skin exam shows  no rashes ecchymoses or petechia. Neurological exam shows no focal neurological deficits.  Lab Results  Component Value Date   WBC 8.5 06/05/2015   HGB 13.5 06/05/2015   HCT 40.3 06/05/2015   MCV 84 06/05/2015   PLT 285 06/05/2015     Chemistry      Component Value Date/Time   NA 139 12/05/2014 1036   NA 138 04/11/2014 0745   NA 142 04/26/2013 0805   K 3.8 12/05/2014 1036   K 3.9 04/11/2014 0745   K 3.9 04/26/2013 0805   CL 103 04/11/2014 0745   CL 101 04/26/2013 0805   CO2 22 12/05/2014 1036   CO2 25 04/11/2014 0745   CO2 30 04/26/2013 0805   BUN 12.8 12/05/2014 1036   BUN 14 04/11/2014 0745   BUN 16 04/26/2013 0805   CREATININE 0.9 12/05/2014 1036   CREATININE 0.89 04/11/2014 0745   CREATININE 0.90 11/29/2013 1150      Component Value Date/Time   CALCIUM 9.4 12/05/2014 1036   CALCIUM 8.9 04/11/2014 0745   CALCIUM 9.0 04/26/2013 0805   ALKPHOS 158* 12/05/2014 1036   ALKPHOS 110 03/04/2014 1417   ALKPHOS 115* 04/26/2013 0805   AST 22 12/05/2014 1036   AST 16 03/04/2014 1417   AST 20 04/26/2013 0805   ALT 23 12/05/2014 1036   ALT 16 03/04/2014 1417   ALT 12 04/26/2013 0805   BILITOT 0.58 12/05/2014 1036   BILITOT 0.4 03/04/2014 1417   BILITOT 0.80 04/26/2013 0805       Impression and Plan: Donna Dyer is 55 year old African American female. She has an IgG kappa monoclonal spike. I think this is an MGUS.  Her anemia has resolved. Iron certainly has helped her.  For now, I think we can have her come back in 6 months. I don't think we have to do a bone survey given the fact that her blood counts looked okay. We will see what her monoclonal studies show but I would be surprised if they are different in a negative way.  Hopefully if she sees gastroenterology, they can help  with her abdominal issues.  Donna Napoleon, MD 5/26/20178:22 AM

## 2015-06-06 LAB — IGG, IGA, IGM
IgA, Qn, Serum: 239 mg/dL (ref 87–352)
IgM, Qn, Serum: 81 mg/dL (ref 26–217)

## 2015-06-06 LAB — BETA 2 MICROGLOBULIN, SERUM: BETA 2: 2 mg/L (ref 0.6–2.4)

## 2015-06-09 LAB — KAPPA/LAMBDA LIGHT CHAINS
IG KAPPA FREE LIGHT CHAIN: 100.98 mg/L — AB (ref 3.30–19.40)
Ig Lambda Free Light Chain: 14.76 mg/L (ref 5.71–26.30)
Kappa/Lambda FluidC Ratio: 6.84 — ABNORMAL HIGH (ref 0.26–1.65)

## 2015-06-11 ENCOUNTER — Encounter: Payer: Self-pay | Admitting: Nurse Practitioner

## 2015-06-11 LAB — PROTEIN ELECTROPHORESIS, SERUM, WITH REFLEX
A/G Ratio: 0.9 (ref 0.7–1.7)
ALPHA 2: 1 g/dL (ref 0.4–1.0)
Albumin: 3.8 g/dL (ref 2.9–4.4)
Alpha 1: 0.3 g/dL (ref 0.0–0.4)
BETA: 1.2 g/dL (ref 0.7–1.3)
GLOBULIN, TOTAL: 4.4 g/dL — AB (ref 2.2–3.9)
Gamma Globulin: 2 g/dL — ABNORMAL HIGH (ref 0.4–1.8)
Interpretation(See Below): 0
M-SPIKE, %: 1.5 g/dL — AB
Total Protein: 8.2 g/dL (ref 6.0–8.5)

## 2015-06-13 ENCOUNTER — Other Ambulatory Visit: Payer: Self-pay | Admitting: Family Medicine

## 2015-06-24 ENCOUNTER — Encounter: Payer: Self-pay | Admitting: Physician Assistant

## 2015-06-24 ENCOUNTER — Ambulatory Visit (INDEPENDENT_AMBULATORY_CARE_PROVIDER_SITE_OTHER): Payer: Commercial Managed Care - PPO | Admitting: Physician Assistant

## 2015-06-24 VITALS — BP 161/63 | HR 67 | Ht 66.0 in | Wt 229.0 lb

## 2015-06-24 DIAGNOSIS — R208 Other disturbances of skin sensation: Secondary | ICD-10-CM | POA: Diagnosis not present

## 2015-06-24 DIAGNOSIS — R030 Elevated blood-pressure reading, without diagnosis of hypertension: Secondary | ICD-10-CM

## 2015-06-24 DIAGNOSIS — K1379 Other lesions of oral mucosa: Secondary | ICD-10-CM

## 2015-06-24 DIAGNOSIS — R209 Unspecified disturbances of skin sensation: Secondary | ICD-10-CM | POA: Diagnosis not present

## 2015-06-24 DIAGNOSIS — R200 Anesthesia of skin: Secondary | ICD-10-CM

## 2015-06-24 DIAGNOSIS — IMO0001 Reserved for inherently not codable concepts without codable children: Secondary | ICD-10-CM

## 2015-06-24 MED ORDER — MAGIC MOUTHWASH W/LIDOCAINE: 5.0000 mL | Freq: Four times a day (QID) | ORAL | Status: DC

## 2015-06-24 NOTE — Progress Notes (Signed)
   Subjective:    Patient ID: Donna Dyer, female    DOB: 02-09-1960, 55 y.o.   MRN: [id number]  HPI  Patient is a 55 year old female who presents to the clinic with mouth tenderness and lip swelling that occurred 3 days ago. Symptoms started on Sunday morning after she had eaten oatmeal and then right before church ate some peanut butter and crackers. While she was sitting in church she felt like her lips were going numb and her mouth was swollen and tender. She remembers feeling like she could bite her cheek. When she got home she took a couple doses of Benadryl and it seemed better the next morning. She is continuing to take Benadryl for the past 2 days. She has not had a [place] since Sunday. She still has a rawness in her mouth. She denies any worsening swelling. For the most part she feels like her symptoms are better. She denies any new look loss or makeup. She denies any new medications or changes in her medications. She does have a history of sensitivities to medications and foods. She denies any shortness of breath or problems swallowing.    Review of Systems  All other systems reviewed and are negative.      Objective:   Physical Exam  Constitutional: She is oriented to person, place, and time. She appears well-developed and well-nourished.  HENT:  Head: Normocephalic and atraumatic.  Right Ear: External ear normal.  Left Ear: External ear normal.  Nose: Nose normal.  Mouth/Throat: Oropharynx is clear and moist. No oropharyngeal exudate.  No mouth lesions noted.  Erythema of upper palate in between first 2 teeth.  Normal tonsils without exudate.  No lip edema today.   Eyes: Conjunctivae are normal. Right eye exhibits no discharge. Left eye exhibits no discharge.  Neck: Normal range of motion. Neck supple.  Cardiovascular: Normal rate, regular rhythm and normal heart sounds.   Pulmonary/Chest: Effort normal and breath sounds normal.  Lymphadenopathy:    She has no cervical  adenopathy.  Neurological: She is alert and oriented to person, place, and time.  Skin: Skin is dry.  Psychiatric: She has a normal mood and affect. Her behavior is normal.          Assessment & Plan:  Lip numbness/mouth tenderness/allergic reaction- I did send over a Magic mouthwash that can help the rawness in her mouth. Will order some food IGG and IGE to focus on wheat/oats and peanuts. Discussed to continue using Benadryl as needed. Contact us with any new symptoms or allergies. Continue to take Allegra daily. Certainly we have to consider a medication with new allergy but it seems consistent with something she ate or exposed her mouth too.  Elevated blood pressure- no hx of elevation. Asymptomatic. Will continue to monitor. Likely elevated due to recent allergic reaction.

## 2015-06-26 DIAGNOSIS — K1379 Other lesions of oral mucosa: Secondary | ICD-10-CM | POA: Insufficient documentation

## 2015-06-26 DIAGNOSIS — R200 Anesthesia of skin: Secondary | ICD-10-CM | POA: Insufficient documentation

## 2015-06-26 LAB — IGE: IGE (IMMUNOGLOBULIN E), SERUM: 22 kU/L (ref <115)

## 2015-06-26 LAB — ALLERGEN, WHEAT, F4: Wheat IgE: <0.1 kU/L

## 2015-07-02 LAB — IGG FOOD PANEL
ALLERGEN BEEF IGG: 9.5 ug/mL — AB (ref <2.0)
ALLERGEN EGG YOLK IGG: 9.8 ug/mL — AB (ref <2.0)
ALLERGEN, MILK, IGG: 7.41 ug/mL — AB (ref <0.15)
Allergen, Corn, IgG4: <0.15 ug/mL (ref <0.15)
Chicken, IgG: <0.15 ug/mL (ref <0.15)
Egg white, IgG: 5.4 ug/mL — ABNORMAL HIGH (ref <2.0)
Peanut, IgG: <0.15 ug/mL (ref <0.15)
Wheat, IgG: <0.15 ug/mL (ref <0.15)

## 2015-07-03 ENCOUNTER — Encounter: Payer: Self-pay | Admitting: Physician Assistant

## 2015-07-03 DIAGNOSIS — K9049 Malabsorption due to intolerance, not elsewhere classified: Secondary | ICD-10-CM | POA: Insufficient documentation

## 2015-07-10 DIAGNOSIS — I10 Essential (primary) hypertension: Secondary | ICD-10-CM | POA: Diagnosis not present

## 2015-07-10 DIAGNOSIS — E785 Hyperlipidemia, unspecified: Secondary | ICD-10-CM | POA: Diagnosis not present

## 2015-07-10 DIAGNOSIS — R7303 Prediabetes: Secondary | ICD-10-CM | POA: Diagnosis not present

## 2015-07-10 DIAGNOSIS — G4733 Obstructive sleep apnea (adult) (pediatric): Secondary | ICD-10-CM | POA: Diagnosis not present

## 2015-10-01 ENCOUNTER — Other Ambulatory Visit: Payer: Self-pay | Admitting: Family Medicine

## 2015-10-02 LAB — HM MAMMOGRAPHY

## 2015-10-05 DIAGNOSIS — G4733 Obstructive sleep apnea (adult) (pediatric): Secondary | ICD-10-CM | POA: Diagnosis not present

## 2015-10-09 DIAGNOSIS — L720 Epidermal cyst: Secondary | ICD-10-CM | POA: Diagnosis not present

## 2015-10-09 DIAGNOSIS — L300 Nummular dermatitis: Secondary | ICD-10-CM | POA: Diagnosis not present

## 2015-10-12 ENCOUNTER — Ambulatory Visit (INDEPENDENT_AMBULATORY_CARE_PROVIDER_SITE_OTHER): Payer: Commercial Managed Care - PPO | Admitting: Family Medicine

## 2015-10-12 ENCOUNTER — Encounter: Payer: Self-pay | Admitting: Family Medicine

## 2015-10-12 ENCOUNTER — Ambulatory Visit (INDEPENDENT_AMBULATORY_CARE_PROVIDER_SITE_OTHER): Payer: Commercial Managed Care - PPO

## 2015-10-12 VITALS — BP 136/84 | HR 68 | Ht 66.0 in | Wt 225.0 lb

## 2015-10-12 DIAGNOSIS — M109 Gout, unspecified: Secondary | ICD-10-CM

## 2015-10-12 DIAGNOSIS — M79675 Pain in left toe(s): Secondary | ICD-10-CM | POA: Diagnosis not present

## 2015-10-12 DIAGNOSIS — I10 Essential (primary) hypertension: Secondary | ICD-10-CM

## 2015-10-12 DIAGNOSIS — M7989 Other specified soft tissue disorders: Secondary | ICD-10-CM

## 2015-10-12 DIAGNOSIS — E119 Type 2 diabetes mellitus without complications: Secondary | ICD-10-CM | POA: Diagnosis not present

## 2015-10-12 NOTE — Progress Notes (Signed)
Subjective:    CC: HTN  HPI: Hypertension- Pt denies chest pain, SOB, dizziness, or heart palpitations.  Taking meds as directed w/o problems.  Denies medication side effects.  She takes 2 tabs of Bystolic.   Diabetes - no hypoglycemic events. No wounds or sores that are not healing well. No increased thirst or urination. Checking glucose at home. Taking medications as prescribed without any side effects. She plans on changing her Endocrinologist.    Pain over her great toe on the left foot. Says woke up with pain and swelling today. Says she stool a little more at church yesterday but nothing unusual. No trauma or injury. Never had it swell before.     Past medical history, Surgical history, Family history not pertinant except as noted below, Social history, Allergies, and medications have been entered into the medical record, reviewed, and corrections made.   Review of Systems: No fevers, chills, night sweats, weight loss, chest pain, or shortness of breath.   Objective:    General: Well Developed, well nourished, and in no acute distress.  Neuro: Alert and oriented x3, extra-ocular muscles intact, sensation grossly intact.  HEENT: Normocephalic, atraumatic  Skin: Warm and dry, no rashes. Cardiac: Regular rate and rhythm, no murmurs rubs or gallops, no lower extremity edema.  Respiratory: Clear to auscultation bilaterally. Not using accessory muscles, speaking in full sentences. MSK: left great toe swollen at the joint.  Tender with compression.  No erythema.  NOrmal flexion and extension of the toes.    Impression and Recommendations:    HTN - Well controlled. Continue current regimen. Follow up in  6 mo.   DM- she plans on changing endocrinologist. She wants to wait for her info.    Left great toe pain - Will start with x-ray just to rule out any type of fracture. It is quite swollen on exam today. Also consider other things such as arthritis or gout.

## 2015-10-13 ENCOUNTER — Encounter: Payer: Self-pay | Admitting: Family Medicine

## 2015-10-13 DIAGNOSIS — E559 Vitamin D deficiency, unspecified: Secondary | ICD-10-CM | POA: Insufficient documentation

## 2015-10-16 DIAGNOSIS — M109 Gout, unspecified: Secondary | ICD-10-CM | POA: Diagnosis not present

## 2015-10-16 DIAGNOSIS — M79675 Pain in left toe(s): Secondary | ICD-10-CM | POA: Diagnosis not present

## 2015-10-16 DIAGNOSIS — E119 Type 2 diabetes mellitus without complications: Secondary | ICD-10-CM | POA: Diagnosis not present

## 2015-10-16 DIAGNOSIS — E559 Vitamin D deficiency, unspecified: Secondary | ICD-10-CM | POA: Diagnosis not present

## 2015-10-16 DIAGNOSIS — E042 Nontoxic multinodular goiter: Secondary | ICD-10-CM | POA: Diagnosis not present

## 2015-10-20 DIAGNOSIS — E042 Nontoxic multinodular goiter: Secondary | ICD-10-CM | POA: Diagnosis not present

## 2015-10-20 DIAGNOSIS — E559 Vitamin D deficiency, unspecified: Secondary | ICD-10-CM | POA: Diagnosis not present

## 2015-10-20 DIAGNOSIS — E1169 Type 2 diabetes mellitus with other specified complication: Secondary | ICD-10-CM | POA: Diagnosis not present

## 2015-10-20 DIAGNOSIS — E119 Type 2 diabetes mellitus without complications: Secondary | ICD-10-CM | POA: Diagnosis not present

## 2015-10-27 DIAGNOSIS — E042 Nontoxic multinodular goiter: Secondary | ICD-10-CM | POA: Diagnosis not present

## 2015-11-02 ENCOUNTER — Other Ambulatory Visit: Payer: Self-pay | Admitting: Family Medicine

## 2015-11-06 DIAGNOSIS — D485 Neoplasm of uncertain behavior of skin: Secondary | ICD-10-CM | POA: Diagnosis not present

## 2015-11-06 DIAGNOSIS — L729 Follicular cyst of the skin and subcutaneous tissue, unspecified: Secondary | ICD-10-CM | POA: Diagnosis not present

## 2015-11-13 ENCOUNTER — Ambulatory Visit: Payer: Commercial Managed Care - PPO | Admitting: Rheumatology

## 2015-11-26 DIAGNOSIS — M170 Bilateral primary osteoarthritis of knee: Secondary | ICD-10-CM | POA: Insufficient documentation

## 2015-11-26 NOTE — Progress Notes (Deleted)
Office Visit Note  Patient: Donna Dyer             Date of Birth: 05/12/1960           MRN: [id number]             PCP: Beatrice Lecher, MD Referring: Hali Marry, * Visit Date: 12/02/2015 Occupation: @GUAROCC @    Subjective:  No chief complaint on file.   History of Present Illness: Donna Dyer is a 55 y.o. female ***   Activities of Daily Living:  Patient reports morning stiffness for *** {minute/hour:19697}.   Patient {ACTIONS;DENIES/REPORTS:21021675::"Denies"} nocturnal pain.  Difficulty dressing/grooming: {ACTIONS;DENIES/REPORTS:21021675::"Denies"} Difficulty climbing stairs: {ACTIONS;DENIES/REPORTS:21021675::"Denies"} Difficulty getting out of chair: {ACTIONS;DENIES/REPORTS:21021675::"Denies"} Difficulty using hands for taps, buttons, cutlery, and/or writing: {ACTIONS;DENIES/REPORTS:21021675::"Denies"}   No Rheumatology ROS completed.   PMFS History:  Patient Active Problem List   Diagnosis Date Noted  . Food intolerance 07/03/2015  . Lip numbness 06/26/2015  . Tenderness of mouth 06/26/2015  . MGUS (monoclonal gammopathy of unknown significance) 06/05/2015  . Other iron deficiency anemias 06/05/2015  . Iron malabsorption 06/05/2015  . Bleeding hemorrhoids 06/05/2015  . Lumbosacral radiculopathy at S1 07/18/2014  . Other seasonal allergic rhinitis 11/29/2013  . Venous stasis 11/29/2013  . Cramping of feet 09/10/2013  . Polymyalgia rheumatica ([place]) 03/08/2012  . Right L4-L5 Lumbosacral facet joint syndrome 02/16/2012  . Hypertriglyceridemia 09/26/2011  . Vaginal intraepithelial neoplasia III (VAIN III) 08/23/2011  . OSA (obstructive sleep apnea) 03/29/2011  . Multinodular goiter 02/28/2011  . Internal hemorrhoids 01/31/2011  . Diabetes mellitus type 2, controlled, without complications ([place]) 123456  . Essential hypertension, benign 02/23/2010  . Edema 09/23/2009    Past Medical History:  Diagnosis Date  . Anemia   . Bleeding  hemorrhoids 06/05/2015  . Diabetes mellitus   . Edema    LLE- podiatry in [place]  . Endometriosis   . Goiter   . Hemorrhoids   . Hyperlipidemia   . Hypertension   . Iron malabsorption 06/05/2015  . Lumbosacral radiculopathy at S1 07/18/2014   Bilateral  . MGUS (monoclonal gammopathy of unknown significance) 06/05/2015  . Other iron deficiency anemias 06/05/2015  . Pain, joint, hip, right    chronic- Dr Alvan Dame Dr Nelva Bush    Family History  Problem Relation Age of Onset  . Hyperlipidemia Mother   . Cancer Father     bladder  . Heart attack Father     MI at age 37  . Diabetes Sister   . Diabetes Brother   . Diabetes Brother   . Diabetes Brother   . Diabetes Brother   . Sarcoidosis Brother   . Sarcoidosis Sister   . Anesthesia problems Neg Hx   . Hypotension Neg Hx   . Malignant hyperthermia Neg Hx   . Pseudochol deficiency Neg Hx    Past Surgical History:  Procedure Laterality Date  . Back Injections    . ESOPHAGOGASTRODUODENOSCOPY  7-11   for anemia  . FLEXIBLE SIGMOIDOSCOPY  01/31/2011   Procedure: FLEXIBLE SIGMOIDOSCOPY;  Surgeon: Missy Sabins, MD;  Location: [facility];  Service: Endoscopy;  Laterality: N/A;  . TOTAL ABDOMINAL HYSTERECTOMY  1995   bilat oophorectomy/ endometriosis  . VULVAR LESION REMOVAL  09/07/2011   Procedure: VULVAR LESION;  Surgeon: Alvino Chapel, MD;  Location[facility];  Service: Gynecology;  Laterality: N/A;  vaginal lesion   Social History   Social History Narrative   Lives in [place] with husband and daughter. Ambulatory, no  cane or walker. Works at [employer].    [employer].   Right handed.   Caffeine none.     Objective: Vital Signs: There were no vitals taken for this visit.   Physical Exam   Musculoskeletal Exam: ***  CDAI Exam: No CDAI exam completed.    Investigation: Findings:  She has a history of monoclonal IgG CapA protein and Dr. Marin Olp evaluated the patient and he  diagnosed her with MGUS. (2014) CK was elevated at 281.  Sed rate was 44.  IgG was elevated at 2340.  UA was negative.  SPEP positive for immunoglobulin, kappa monoprotein.  PAN-ANCA was negative.  CCP was negative.  Myositis was negative.  Vitamin D was 37.   04/2012  MRI of lumbar spine showed multilevel spondylosis with left-sided disk protrusion at L3-4 and L4-5 and L4-5 facet joint arthropathy.   04/12/2012 x-ray of right hip joint, 2 views, as she was having a lot of hip joint discomfort, which was within normal limits.  Also SI joint appeared normal.  Bilateral knee joint x-rays were also obtained due to knee joint discomfort, AP and lateral, which showed bilateral moderate medial compartment narrowing and mild patellofemoral narrowing consistent with moderate osteoarthritis and chondromalacia, patella.      Imaging: No results found.  Speciality Comments: No specialty comments available.    Procedures:  No procedures performed Allergies: Pineapple; Onglyza [saxagliptin]; Trulicity [dulaglutide]; Amlodipine besylate; Azithromycin; Chlorthalidone; Codeine; Hctz [hydrochlorothiazide]; Metformin and related; and Sulfonamide derivatives   Assessment / Plan: Visit Diagnoses: No diagnosis found.    Orders: No orders of the defined types were placed in this encounter.  No orders of the defined types were placed in this encounter.   Face-to-face time spent with patient was *** minutes. 50% of time was spent in counseling and coordination of care.  Follow-Up Instructions: No Follow-up on file.   Amy Littrell, RT

## 2015-11-30 ENCOUNTER — Encounter: Payer: Self-pay | Admitting: *Deleted

## 2015-11-30 DIAGNOSIS — M810 Age-related osteoporosis without current pathological fracture: Secondary | ICD-10-CM

## 2015-11-30 DIAGNOSIS — M19041 Primary osteoarthritis, right hand: Secondary | ICD-10-CM

## 2015-11-30 DIAGNOSIS — M5136 Other intervertebral disc degeneration, lumbar region: Secondary | ICD-10-CM

## 2015-11-30 DIAGNOSIS — M19042 Primary osteoarthritis, left hand: Secondary | ICD-10-CM

## 2015-11-30 DIAGNOSIS — M170 Bilateral primary osteoarthritis of knee: Secondary | ICD-10-CM

## 2015-11-30 DIAGNOSIS — J45909 Unspecified asthma, uncomplicated: Secondary | ICD-10-CM

## 2015-11-30 DIAGNOSIS — M797 Fibromyalgia: Secondary | ICD-10-CM

## 2015-11-30 DIAGNOSIS — D649 Anemia, unspecified: Secondary | ICD-10-CM | POA: Insufficient documentation

## 2015-11-30 DIAGNOSIS — M51369 Other intervertebral disc degeneration, lumbar region without mention of lumbar back pain or lower extremity pain: Secondary | ICD-10-CM

## 2015-11-30 DIAGNOSIS — M5030 Other cervical disc degeneration, unspecified cervical region: Secondary | ICD-10-CM

## 2015-11-30 DIAGNOSIS — M1120 Other chondrocalcinosis, unspecified site: Secondary | ICD-10-CM | POA: Insufficient documentation

## 2015-11-30 HISTORY — DX: Bilateral primary osteoarthritis of knee: M17.0

## 2015-11-30 HISTORY — DX: Other intervertebral disc degeneration, lumbar region: M51.36

## 2015-11-30 HISTORY — DX: Other chondrocalcinosis, unspecified site: M11.20

## 2015-11-30 HISTORY — DX: Age-related osteoporosis without current pathological fracture: M81.0

## 2015-11-30 HISTORY — DX: Fibromyalgia: M79.7

## 2015-11-30 HISTORY — DX: Primary osteoarthritis, right hand: M19.041

## 2015-11-30 HISTORY — DX: Other cervical disc degeneration, unspecified cervical region: M50.30

## 2015-11-30 HISTORY — DX: Other intervertebral disc degeneration, lumbar region without mention of lumbar back pain or lower extremity pain: M51.369

## 2015-11-30 HISTORY — DX: Unspecified asthma, uncomplicated: J45.909

## 2015-12-02 ENCOUNTER — Ambulatory Visit: Payer: Commercial Managed Care - PPO | Admitting: Rheumatology

## 2015-12-10 DIAGNOSIS — R252 Cramp and spasm: Secondary | ICD-10-CM | POA: Diagnosis not present

## 2015-12-10 DIAGNOSIS — E669 Obesity, unspecified: Secondary | ICD-10-CM | POA: Diagnosis not present

## 2015-12-10 DIAGNOSIS — G4733 Obstructive sleep apnea (adult) (pediatric): Secondary | ICD-10-CM | POA: Diagnosis not present

## 2015-12-10 DIAGNOSIS — G4710 Hypersomnia, unspecified: Secondary | ICD-10-CM | POA: Diagnosis not present

## 2015-12-11 ENCOUNTER — Other Ambulatory Visit (HOSPITAL_BASED_OUTPATIENT_CLINIC_OR_DEPARTMENT_OTHER): Payer: Commercial Managed Care - PPO

## 2015-12-11 ENCOUNTER — Ambulatory Visit (HOSPITAL_BASED_OUTPATIENT_CLINIC_OR_DEPARTMENT_OTHER): Payer: Commercial Managed Care - PPO | Admitting: Hematology & Oncology

## 2015-12-11 VITALS — BP 156/71 | HR 61 | Temp 98.9°F | Resp 20 | Wt 225.1 lb

## 2015-12-11 DIAGNOSIS — E119 Type 2 diabetes mellitus without complications: Secondary | ICD-10-CM | POA: Diagnosis not present

## 2015-12-11 DIAGNOSIS — D649 Anemia, unspecified: Secondary | ICD-10-CM

## 2015-12-11 DIAGNOSIS — D472 Monoclonal gammopathy: Secondary | ICD-10-CM

## 2015-12-11 DIAGNOSIS — K909 Intestinal malabsorption, unspecified: Secondary | ICD-10-CM

## 2015-12-11 DIAGNOSIS — D509 Iron deficiency anemia, unspecified: Secondary | ICD-10-CM | POA: Diagnosis not present

## 2015-12-11 LAB — IRON AND TIBC
%SAT: 21 % (ref 21–57)
Iron: 67 ug/dL (ref 41–142)
TIBC: 319 ug/dL (ref 236–444)
UIBC: 252 ug/dL (ref 120–384)

## 2015-12-11 LAB — CBC WITH DIFFERENTIAL (CANCER CENTER ONLY)
BASO#: 0 10*3/uL (ref 0.0–0.2)
BASO%: 0.3 % (ref 0.0–2.0)
EOS ABS: 0.1 10*3/uL (ref 0.0–0.5)
EOS%: 1.4 % (ref 0.0–7.0)
HCT: 36.9 % (ref 34.8–46.6)
HGB: 12.3 g/dL (ref 11.6–15.9)
LYMPH#: 2.2 10*3/uL (ref 0.9–3.3)
LYMPH%: 31.3 % (ref 14.0–48.0)
MCH: 28 pg (ref 26.0–34.0)
MCHC: 33.3 g/dL (ref 32.0–36.0)
MCV: 84 fL (ref 81–101)
MONO#: 0.5 10*3/uL (ref 0.1–0.9)
MONO%: 7.1 % (ref 0.0–13.0)
NEUT#: 4.2 10*3/uL (ref 1.5–6.5)
NEUT%: 59.9 % (ref 39.6–80.0)
PLATELETS: 287 10*3/uL (ref 145–400)
RBC: 4.39 10*6/uL (ref 3.70–5.32)
RDW: 13.4 % (ref 11.1–15.7)
WBC: 7 10*3/uL (ref 3.9–10.0)

## 2015-12-11 LAB — COMPREHENSIVE METABOLIC PANEL
ALBUMIN: 3.8 g/dL (ref 3.5–5.0)
ALT: 19 U/L (ref 0–55)
AST: 20 U/L (ref 5–34)
Alkaline Phosphatase: 141 U/L (ref 40–150)
Anion Gap: 10 mEq/L (ref 3–11)
BUN: 11.6 mg/dL (ref 7.0–26.0)
CALCIUM: 9.3 mg/dL (ref 8.4–10.4)
CHLORIDE: 100 meq/L (ref 98–109)
CO2: 25 mEq/L (ref 22–29)
CREATININE: 1 mg/dL (ref 0.6–1.1)
EGFR: 77 mL/min/{1.73_m2} — ABNORMAL LOW (ref >90)
GLUCOSE: 115 mg/dL (ref 70–140)
POTASSIUM: 3.8 meq/L (ref 3.5–5.1)
SODIUM: 136 meq/L (ref 136–145)
Total Bilirubin: 0.73 mg/dL (ref 0.20–1.20)
Total Protein: 9.1 g/dL — ABNORMAL HIGH (ref 6.4–8.3)

## 2015-12-11 LAB — FERRITIN: Ferritin: 175 ng/ml (ref 9–269)

## 2015-12-11 LAB — LACTATE DEHYDROGENASE: LDH: 198 U/L (ref 125–245)

## 2015-12-11 NOTE — Progress Notes (Signed)
Hematology and Oncology Follow Up Visit  Donna Dyer [id number] [birth year] 55 y.o. 12/11/2015   Principle Diagnosis:  IgG kappa monoclonal gammopathy of undetermined significance. 2. Anemia - possible iron deficiency.  Current Therapy:    Observation     Interim History:  Donna Dyer is back for followup. She sees be doing okay. We last saw her back in May. She had iron back in December of 2016. She does not absorb iron because of all of her medications. The iron really has helped her.  Her last myeloma studies have been holding very steady. Her last M spike was 1.5 g/dL.  Her IgG level was 2390 mg/dL. Her Kappa Lightchain was up a little bit at 10.7 mg/dL.  Her husband has myelofibrosis. He is doing okay. She has to do a lot of yard work. She may go by leaf blower to get these off the yard.   Her last mammogram was in September. Everything looked okay. mogram I think in July.   She's had no rashes. Has not been any leg swelling.  Overall, her performance status is ECOG 0.  Medications:  Current Outpatient Prescriptions:  .  Acetaminophen (TYLENOL EXTRA STRENGTH PO), Take 2 tablets by mouth as needed., Disp: , Rfl:  .  AMBULATORY NON FORMULARY MEDICATION, One touch ultra strips and lancets for the One Touch Mini. Check fasting blood sugar in the morning and random blood sugar in the evening. Diagnosis Diabetes Type 2, ICD-10 code E11.9, Disp: 3 each, Rfl: 3 .  Ascorbic Acid (VITAMIN C) 100 MG tablet, Take 100 mg by mouth daily., Disp: , Rfl:  .  atorvastatin (LIPITOR) 10 MG tablet, TAKE ONE TABLET (10 MG TOTAL) BY MOUTH DAILY., Disp: , Rfl: 5 .  Biotin w/ Vitamins C & E (HAIR/SKIN/NAILS PO), Take by mouth., Disp: , Rfl:  .  BYSTOLIC 10 MG tablet, TAKE 2 TABLETS DAILY, Disp: 180 tablet, Rfl: 0 .  Calcium-Vitamin D-Vitamin K (VIACTIV PO), Take by mouth., Disp: , Rfl:  .  Cholecalciferol (VITAMIN D) 2000 UNITS CAPS, Take 2,000 Units by mouth daily. , Disp: , Rfl:  .  diclofenac  sodium (VOLTAREN) 1 % GEL, Apply topically 4 (four) times daily., Disp: , Rfl:  .  fexofenadine (ALLEGRA) 180 MG tablet, Take 1 tablet (180 mg total) by mouth daily., Disp: 90 tablet, Rfl: 1 .  fluticasone (FLONASE) 50 MCG/ACT nasal spray, Place into both nostrils daily., Disp: , Rfl:  .  gabapentin (NEURONTIN) 300 MG capsule, 300 mg at bedtime., Disp: , Rfl:  .  Multiple Vitamin (MULTIVITAMIN) tablet, Take 1 tablet by mouth daily.  , Disp: , Rfl:  .  verapamil (CALAN-SR) 180 MG CR tablet, TAKE 1 TABLET TWICE A DAY, Disp: 180 tablet, Rfl: 2  Allergies:  Allergies  Allergen Reactions  . Pineapple Itching and Swelling    Tongue swells  . Onglyza [Saxagliptin] Itching  . Trulicity [Dulaglutide] Itching    ? Hypersensitivity reaction  . Amlodipine Besylate Other (See Comments)    muscle cramps  . Azithromycin Itching and Rash  . Chlorthalidone Other (See Comments)    Muscle cramps  . Codeine Nausea And Vomiting  . Hctz [Hydrochlorothiazide] Other (See Comments)    Muscle spasms    . Metformin And Related Diarrhea  . Sulfonamide Derivatives Rash    Past Medical History, Surgical history, Social history, and Family History were reviewed and updated.  Review of Systems: As above  Physical Exam:  weight is 225 lb 1.9 oz (  102.1 kg). Her oral temperature is 98.9 F (37.2 C). Her blood pressure is 156/71 (abnormal) and her pulse is 61. Her respiration is 20.   Obese African-American female in no obvious distress. Head and neck exam shows no ocular or oral lesions. Pupils reacted properly. Lungs are clear. Cardiac exam regular rate and rhythm with no murmurs rubs or bruits. Abdomen soft. She is obese. Good bowel sounds. There is no fluid wave. There is no palpable liver or spleen tip. Back exam shows no tenderness over the spine ribs or hips. Extremities shows some 1+ edema in her lower legs. She has no joint swelling or erythema. Muscle strength is adequate. Strength is good. Skin exam  shows  no rashes ecchymoses or petechia. Neurological exam shows no focal neurological deficits.  Lab Results  Component Value Date   WBC 7.0 12/11/2015   HGB 12.3 12/11/2015   HCT 36.9 12/11/2015   MCV 84 12/11/2015   PLT 287 12/11/2015     Chemistry      Component Value Date/Time   NA 137 06/05/2015 0801   K 3.8 06/05/2015 0801   CL 103 04/11/2014 0745   CL 101 04/26/2013 0805   CO2 24 06/05/2015 0801   BUN 13.1 06/05/2015 0801   CREATININE 1.0 06/05/2015 0801   GLU 153 04/15/2014      Component Value Date/Time   CALCIUM 9.2 06/05/2015 0801   ALKPHOS 163 (H) 06/05/2015 0801   AST 19 06/05/2015 0801   ALT 20 06/05/2015 0801   BILITOT 0.63 06/05/2015 0801       Impression and Plan: Donna Dyer is 55 year old African American female. She has an IgG kappa monoclonal spike. I think this is an MGUS.  Her anemia has resolved. Iron certainly has helped her.  For now, I think we can have her come back in 6 months. I don't think we have to do a bone survey given the fact that her blood counts looked okay. We will see what her monoclonal studies show, but I would be surprised if there is any progression..  .  [name], MD 12/1/20179:00 AM

## 2015-12-12 LAB — IGG, IGA, IGM
IgA, Qn, Serum: 247 mg/dL (ref 87–352)
IgG, Qn, Serum: 2379 mg/dL — ABNORMAL HIGH (ref 700–1600)
IgM, Qn, Serum: 101 mg/dL (ref 26–217)

## 2015-12-14 ENCOUNTER — Telehealth: Payer: Self-pay | Admitting: *Deleted

## 2015-12-14 LAB — KAPPA/LAMBDA LIGHT CHAINS
IG LAMBDA FREE LIGHT CHAIN: 12.4 mg/L (ref 5.7–26.3)
Ig Kappa Free Light Chain: 72.1 mg/L — ABNORMAL HIGH (ref 3.3–19.4)
KAPPA/LAMBDA FLC RATIO: 5.81 — AB (ref 0.26–1.65)

## 2015-12-14 LAB — BETA 2 MICROGLOBULIN, SERUM: BETA 2: 2.4 mg/L (ref 0.6–2.4)

## 2015-12-14 NOTE — Telephone Encounter (Addendum)
Patient's husband received message to relay to patient  ----- Message from [name], MD sent at 12/11/2015 12:26 PM EST ----- Call - iron level is ok!!  pete

## 2015-12-16 LAB — PROTEIN ELECTROPHORESIS, SERUM, WITH REFLEX
A/G Ratio: 0.8 (ref 0.7–1.7)
ALBUMIN: 3.7 g/dL (ref 2.9–4.4)
ALPHA 1: 0.2 g/dL (ref 0.0–0.4)
ALPHA 2: 0.9 g/dL (ref 0.4–1.0)
BETA: 1.3 g/dL (ref 0.7–1.3)
Gamma Globulin: 2.1 g/dL — ABNORMAL HIGH (ref 0.4–1.8)
Globulin, Total: 4.5 g/dL — ABNORMAL HIGH (ref 2.2–3.9)
INTERPRETATION(SEE BELOW): 0
M-Spike, %: 1.4 g/dL — ABNORMAL HIGH
Total Protein: 8.2 g/dL (ref 6.0–8.5)

## 2015-12-17 ENCOUNTER — Encounter: Payer: Self-pay | Admitting: *Deleted

## 2015-12-29 ENCOUNTER — Other Ambulatory Visit: Payer: Self-pay | Admitting: Family Medicine

## 2016-01-13 ENCOUNTER — Ambulatory Visit (INDEPENDENT_AMBULATORY_CARE_PROVIDER_SITE_OTHER): Payer: Commercial Managed Care - PPO | Admitting: Rheumatology

## 2016-01-13 ENCOUNTER — Encounter: Payer: Self-pay | Admitting: Rheumatology

## 2016-01-13 VITALS — BP 136/74 | HR 72 | Resp 16 | Ht 66.0 in | Wt 230.0 lb

## 2016-01-13 DIAGNOSIS — Z6835 Body mass index (BMI) 35.0-35.9, adult: Secondary | ICD-10-CM

## 2016-01-13 DIAGNOSIS — E119 Type 2 diabetes mellitus without complications: Secondary | ICD-10-CM

## 2016-01-13 DIAGNOSIS — M5030 Other cervical disc degeneration, unspecified cervical region: Secondary | ICD-10-CM | POA: Diagnosis not present

## 2016-01-13 DIAGNOSIS — E781 Pure hyperglyceridemia: Secondary | ICD-10-CM

## 2016-01-13 DIAGNOSIS — M19041 Primary osteoarthritis, right hand: Secondary | ICD-10-CM

## 2016-01-13 DIAGNOSIS — D472 Monoclonal gammopathy: Secondary | ICD-10-CM

## 2016-01-13 DIAGNOSIS — D072 Carcinoma in situ of vagina: Secondary | ICD-10-CM

## 2016-01-13 DIAGNOSIS — I10 Essential (primary) hypertension: Secondary | ICD-10-CM

## 2016-01-13 DIAGNOSIS — M51369 Other intervertebral disc degeneration, lumbar region without mention of lumbar back pain or lower extremity pain: Secondary | ICD-10-CM

## 2016-01-13 DIAGNOSIS — E042 Nontoxic multinodular goiter: Secondary | ICD-10-CM | POA: Diagnosis not present

## 2016-01-13 DIAGNOSIS — M5136 Other intervertebral disc degeneration, lumbar region: Secondary | ICD-10-CM | POA: Diagnosis not present

## 2016-01-13 DIAGNOSIS — M170 Bilateral primary osteoarthritis of knee: Secondary | ICD-10-CM | POA: Diagnosis not present

## 2016-01-13 DIAGNOSIS — K649 Unspecified hemorrhoids: Secondary | ICD-10-CM

## 2016-01-13 DIAGNOSIS — M19042 Primary osteoarthritis, left hand: Secondary | ICD-10-CM

## 2016-01-13 DIAGNOSIS — E6609 Other obesity due to excess calories: Secondary | ICD-10-CM

## 2016-01-13 DIAGNOSIS — M810 Age-related osteoporosis without current pathological fracture: Secondary | ICD-10-CM | POA: Diagnosis not present

## 2016-01-13 DIAGNOSIS — G4733 Obstructive sleep apnea (adult) (pediatric): Secondary | ICD-10-CM | POA: Diagnosis not present

## 2016-01-13 NOTE — Patient Instructions (Signed)
Knee Exercises Ask your health care provider which exercises are safe for you. Do exercises exactly as told by your health care provider and adjust them as directed. It is normal to feel mild stretching, pulling, tightness, or discomfort as you do these exercises, but you should stop right away if you feel sudden pain or your pain gets worse.Do not begin these exercises until told by your health care provider. STRETCHING AND RANGE OF MOTION EXERCISES  These exercises warm up your muscles and joints and improve the movement and flexibility of your knee. These exercises also help to relieve pain, numbness, and tingling. Exercise A: Knee Extension, Prone 1. Lie on your abdomen on a bed. 2. Place your left / right knee just beyond the edge of the surface so your knee is not on the bed. You can put a towel under your left / right thigh just above your knee for comfort. 3. Relax your leg muscles and allow gravity to straighten your knee. You should feel a stretch behind your left / right knee. 4. Hold this position for __________ seconds. 5. Scoot up so your knee is supported between repetitions. Repeat __________ times. Complete this stretch __________ times a day. Exercise B: Knee Flexion, Active  1. Lie on your back with both knees straight. If this causes back discomfort, bend your left / right knee so your foot is flat on the floor. 2. Slowly slide your left / right heel back toward your buttocks until you feel a gentle stretch in the front of your knee or thigh. 3. Hold this position for __________ seconds. 4. Slowly slide your left / right heel back to the starting position. Repeat __________ times. Complete this exercise __________ times a day. Exercise C: Quadriceps, Prone  1. Lie on your abdomen on a firm surface, such as a bed or padded floor. 2. Bend your left / right knee and hold your ankle. If you cannot reach your ankle or pant leg, loop a belt around your foot and grab the belt  instead. 3. Gently pull your heel toward your buttocks. Your knee should not slide out to the side. You should feel a stretch in the front of your thigh and knee. 4. Hold this position for __________ seconds. Repeat __________ times. Complete this stretch __________ times a day. Exercise D: Hamstring, Supine 1. Lie on your back. 2. Loop a belt or towel over the ball of your left / right foot. The ball of your foot is on the walking surface, right under your toes. 3. Straighten your left / right knee and slowly pull on the belt to raise your leg until you feel a gentle stretch behind your knee.  Do not let your left / right knee bend while you do this.  Keep your other leg flat on the floor. 4. Hold this position for __________ seconds. Repeat __________ times. Complete this stretch __________ times a day. STRENGTHENING EXERCISES  These exercises build strength and endurance in your knee. Endurance is the ability to use your muscles for a long time, even after they get tired. Exercise E: Quadriceps, Isometric  1. Lie on your back with your left / right leg extended and your other knee bent. Put a rolled towel or small pillow under your knee if told by your health care provider. 2. Slowly tense the muscles in the front of your left / right thigh. You should see your kneecap slide up toward your hip or see increased dimpling just above the  knee. This motion will push the back of the knee toward the floor. 3. For __________ seconds, keep the muscle as tight as you can without increasing your pain. 4. Relax the muscles slowly and completely. Repeat __________ times. Complete this exercise __________ times a day. Exercise F: Straight Leg Raises - Quadriceps 1. Lie on your back with your left / right leg extended and your other knee bent. 2. Tense the muscles in the front of your left / right thigh. You should see your kneecap slide up or see increased dimpling just above the knee. Your thigh may  even shake a bit. 3. Keep these muscles tight as you raise your leg 4-6 inches (10-15 cm) off the floor. Do not let your knee bend. 4. Hold this position for __________ seconds. 5. Keep these muscles tense as you lower your leg. 6. Relax your muscles slowly and completely after each repetition. Repeat __________ times. Complete this exercise __________ times a day. Exercise G: Hamstring, Isometric 1. Lie on your back on a firm surface. 2. Bend your left / right knee approximately __________ degrees. 3. Dig your left / right heel into the surface as if you are trying to pull it toward your buttocks. Tighten the muscles in the back of your thighs to dig as hard as you can without increasing any pain. 4. Hold this position for __________ seconds. 5. Release the tension gradually and allow your muscles to relax completely for __________ seconds after each repetition. Repeat __________ times. Complete this exercise __________ times a day. Exercise H: Hamstring Curls  If told by your health care provider, do this exercise while wearing ankle weights. Begin with __________ weights. Then increase the weight by 1 lb (0.5 kg) increments. Do not wear ankle weights that are more than __________. 1. Lie on your abdomen with your legs straight. 2. Bend your left / right knee as far as you can without feeling pain. Keep your hips flat against the floor. 3. Hold this position for __________ seconds. 4. Slowly lower your leg to the starting position. Repeat __________ times. Complete this exercise __________ times a day. Exercise I: Squats (Quadriceps) 1. Stand in front of a table, with your feet and knees pointing straight ahead. You may rest your hands on the table for balance but not for support. 2. Slowly bend your knees and lower your hips like you are going to sit in a chair.  Keep your weight over your heels, not over your toes.  Keep your lower legs upright so they are parallel with the table  legs.  Do not let your hips go lower than your knees.  Do not bend lower than told by your health care provider.  If your knee pain increases, do not bend as low. 3. Hold the squat position for __________ seconds. 4. Slowly push with your legs to return to standing. Do not use your hands to pull yourself to standing. Repeat __________ times. Complete this exercise __________ times a day. Exercise J: Wall Slides (Quadriceps)  1. Lean your back against a smooth wall or door while you walk your feet out 18-24 inches (46-61 cm) from it. 2. Place your feet hip-width apart. 3. Slowly slide down the wall or door until your knees bend __________ degrees. Keep your knees over your heels, not over your toes. Keep your knees in line with your hips. 4. Hold for __________ seconds. Repeat __________ times. Complete this exercise __________ times a day. Exercise K: Straight Leg Raises -  Hip Abductors 1. Lie on your side with your left / right leg in the top position. Lie so your head, shoulder, knee, and hip line up. You may bend your bottom knee to help you keep your balance. 2. Roll your hips slightly forward so your hips are stacked directly over each other and your left / right knee is facing forward. 3. Leading with your heel, lift your top leg 4-6 inches (10-15 cm). You should feel the muscles in your outer hip lifting.  Do not let your foot drift forward.  Do not let your knee roll toward the ceiling. 4. Hold this position for __________ seconds. 5. Slowly return your leg to the starting position. 6. Let your muscles relax completely after each repetition. Repeat __________ times. Complete this exercise __________ times a day. Exercise L: Straight Leg Raises - Hip Extensors 1. Lie on your abdomen on a firm surface. You can put a pillow under your hips if that is more comfortable. 2. Tense the muscles in your buttocks and lift your left / right leg about 4-6 inches (10-15 cm). Keep your knee  straight as you lift your leg. 3. Hold this position for __________ seconds. 4. Slowly lower your leg to the starting position. 5. Let your leg relax completely after each repetition. Repeat __________ times. Complete this exercise __________ times a day. This information is not intended to replace advice given to you by your health care provider. Make sure you discuss any questions you have with your health care provider. Document Released: 11/10/2004 Document Revised: 09/21/2015 Document Reviewed: 11/02/2014 Elsevier Interactive Patient Education  2017 [place]. Hip Exercises Ask your health care provider which exercises are safe for you. Do exercises exactly as told by your health care provider and adjust them as directed. It is normal to feel mild stretching, pulling, tightness, or discomfort as you do these exercises, but you should stop right away if you feel sudden pain or your pain gets worse.Do not begin these exercises until told by your health care provider. STRETCHING AND RANGE OF MOTION EXERCISES  These exercises warm up your muscles and joints and improve the movement and flexibility of your hip. These exercises also help to relieve pain, numbness, and tingling. Exercise A: Hamstrings, Supine  1. Lie on your back. 2. Loop a belt or towel over the ball of your left / rightfoot. The ball of your foot is on the walking surface, right under your toes. 3. Straighten your left / rightknee and slowly pull on the belt to raise your leg.  Do not let your left / right knee bend while you do this.  Keep your other leg flat on the floor.  Raise the left / right leg until you feel a gentle stretch behind your left / right knee or thigh. 4. Hold this position for __________ seconds. 5. Slowly return your leg to the starting position. Repeat __________ times. Complete this stretch __________ times a day. Exercise B: Hip Rotators  1. Lie on your back on a firm surface. 2. Hold your  left / right knee with your left / right hand. Hold your ankle with your other hand. 3. Gently pull your left / right knee and rotate your lower leg toward your other shoulder.  Pull until you feel a stretch in your buttocks.  Keep your hips and shoulders firmly planted while you do this stretch. 4. Hold this position for __________ seconds. Repeat __________ times. Complete this stretch __________ times a day.  Exercise C: V-Sit (Hamstrings and Adductors)  5. Sit on the floor with your legs extended in a large "V" shape. Keep your knees straight during this exercise. 6. Start with your head and chest upright, then bend at your waist to reach for your left foot (position A). You should feel a stretch in your right inner thigh. 7. Hold this position for __________ seconds. Then slowly return to the upright position. 8. Bend at your waist to reach forward (position B). You should feel a stretch behind both of your thighs and knees. 9. Hold this position for __________ seconds. Then slowly return to the upright position. 10. Bend at your waist to reach for your right foot (position C). You should feel a stretch in your left inner thigh. 11. Hold this position for __________ seconds. Then slowly return to the upright position. Repeat __________ times. Complete this stretch __________ times a day. Exercise D: Lunge (Hip Flexors)  1. Place your left / right knee on the floor and bend your other knee so that is directly over your ankle. You should be half-kneeling. 2. Keep good posture with your head over your shoulders. 3. Tighten your buttocks to point your tailbone downward. This helps your back to keep from arching too much. 4. You should feel a gentle stretch in the front of your left / right thigh and hip. If you do not feel any resistance, slightly slide your other foot forward and then slowly lunge forward so your knee once again lines up over your ankle. 5. Make sure your tailbone continues to  point downward. 6. Hold this position for __________ seconds. Repeat __________ times. Complete this stretch __________ times a day. STRENGTHENING EXERCISES  These exercises build strength and endurance in your hip. Endurance is the ability to use your muscles for a long time, even after they get tired. Exercise E: Bridge (Hip Extensors)  1. Lie on your back on a firm surface with your knees bent and your feet flat on the floor. 2. Tighten your buttocks muscles and lift your bottom off the floor until the trunk of your body is level with your thighs.  Do not arch your back.  You should feel the muscles working in your buttocks and the back of your thighs. If you do not feel these muscles, slide your feet 1-2 inches (2.5-5 cm) farther away from your buttocks. 3. Hold this position for __________ seconds. 4. Slowly lower your hips to the starting position. 5. Let your muscles relax completely between repetitions. 6. If this exercise is too easy, try doing it with your arms crossed over your chest. Repeat __________ times. Complete this exercise __________ times a day. Exercise F: Straight Leg Raises - Hip Abductors  1. Lie on your side with your left / right leg in the top position. Lie so your head, shoulder, knee, and hip line up with each other. You may bend your bottom knee to help you balance. 2. Roll your hips slightly forward, so your hips are stacked directly over each other and your left / right knee is facing forward. 3. Leading with your heel, lift your top leg 4-6 inches (10-15 cm). You should feel the muscles in your outer hip lifting.  Do not let your foot drift forward.  Do not let your knee roll toward the ceiling. 4. Hold this position for __________ seconds. 5. Slowly return to the starting position. 6. Let your muscles relax completely between repetitions. Repeat __________ times. Complete this exercise  __________ times a day. Exercise G: Straight Leg Raises - Hip  Adductors  6. Lie on your side with your left / right leg in the bottom position. Lie so your head, shoulder, knee, and hip line up. You may place your upper foot in front to help you balance. 7. Roll your hips slightly forward, so your hips are stacked directly over each other and your left / right knee is facing forward. 8. Tense the muscles in your inner thigh and lift your bottom leg 4-6 inches (10-15 cm). 9. Hold this position for __________ seconds. 10. Slowly return to the starting position. 11. Let your muscles relax completely between repetitions. Repeat __________ times. Complete this exercise __________ times a day. Exercise H: Straight Leg Raises - Quadriceps  5. Lie on your back with your left / right leg extended and your other knee bent. 6. Tense the muscles in the front of your left / right thigh. When you do this, you should see your kneecap slide up or see increased dimpling just above your knee. 7. Tighten these muscles even more and raise your leg 4-6 inches (10-15 cm) off the floor. 8. Hold this position for __________ seconds. 9. Keep these muscles tense as you lower your leg. 10. Relax the muscles slowly and completely between repetitions. Repeat __________ times. Complete this exercise __________ times a day. Exercise I: Hip Abductors, Standing 5. Tie one end of a rubber exercise band or tubing to a secure surface, such as a table or pole. 6. Loop the other end of the band or tubing around your left / right ankle. 7. Keeping your ankle with the band or tubing directly opposite of the secured end, step away until there is tension in the tubing or band. Hold onto a chair as needed for balance. 8. Lift your left / right leg out to your side. While you do this:  Keep your back upright.  Keep your shoulders over your hips.  Keep your toes pointing forward.  Make sure to use your hip muscles to lift your leg. Do not "throw" your leg or tip your body to lift your  leg. 9. Hold this position for __________ seconds. 10. Slowly return to the starting position. Repeat __________ times. Complete this exercise __________ times a day. Exercise J: Squats (Quadriceps) 1. Stand in a door frame so your feet and knees are in line with the frame. You may place your hands on the frame for balance. 2. Slowly bend your knees and lower your hips like you are going to sit in a chair.  Keep your lower legs in a straight-up-and-down position.  Do not let your hips go lower than your knees.  Do not bend your knees lower than told by your health care provider.  If your hip pain increases, do not bend as low. 3. Hold this position for ___________ seconds. 4. Slowly push with your legs to return to standing. Do not use your hands to pull yourself to standing. Repeat __________ times. Complete this exercise __________ times a day. This information is not intended to replace advice given to you by your health care provider. Make sure you discuss any questions you have with your health care provider. Document Released: 01/14/2005 Document Revised: 09/21/2015 Document Reviewed: 12/22/2014 Elsevier Interactive Patient Education  2017 [place] for OA Natural anti-inflammatories  You can purchase these at [employer], [employer] or online.  . Turmeric (capsules)  . Ginger (ginger root or capsules)  . Omega  3 (Fish, flax seeds, chia seeds, walnuts, almonds)  . Tart cherry (dried or extract)   Patient should be under the care of a physician while taking these supplements. This may not be reproduced without the permission of Dr. Bo Merino.

## 2016-01-13 NOTE — Progress Notes (Signed)
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 female with history of osteoarthritis and disc disease. She's been having intermittent pain in her right knee joint for the last 6 months. She denies any swelling. She's also had discomfort in her right hip area which is been going on for several years. She denies pain in any other joints. He denies any neck or lower back pain currently.  Activities of Daily Living:  Patient reports morning stiffness for 0 minute.   Patient Denies nocturnal pain.  Difficulty dressing/grooming: Denies Difficulty climbing stairs: Reports Difficulty getting out of chair: Denies Difficulty using hands for taps, buttons, cutlery, and/or writing: Denies   Review of Systems  Constitutional: Positive for weight gain. Negative for fatigue, night sweats, weight loss and weakness.  HENT: Positive for mouth dryness. Negative for mouth sores, trouble swallowing, trouble swallowing and nose dryness.   Eyes: Positive for dryness. Negative for pain, redness and visual disturbance.  Respiratory: Negative for cough, shortness of breath and difficulty breathing.   Cardiovascular: Negative for chest pain, palpitations, hypertension, irregular heartbeat and swelling in legs/feet.  Gastrointestinal: Negative for blood in stool, constipation and diarrhea.  Endocrine: Negative for increased urination.  Genitourinary: Negative for vaginal dryness.  Musculoskeletal: Positive for arthralgias, joint pain, myalgias and myalgias. Negative for joint swelling, muscle weakness, morning stiffness and muscle tenderness.  Skin: Positive for rash. Negative  for color change, hair loss, skin tightness, ulcers and sensitivity to sunlight.       Diagnosed as eczema by dermatologist  Allergic/Immunologic: Negative for susceptible to infections.  Neurological: Negative for dizziness, memory loss and night sweats.  Hematological: Negative for swollen glands.  Psychiatric/Behavioral: Negative for depressed mood and sleep disturbance. The patient is not nervous/anxious.     PMFS History:  Patient Active Problem List   Diagnosis Date Noted  . Pseudogout 11/30/2015  . Fibromyalgia 11/30/2015  . DDD (degenerative disc disease), cervical 11/30/2015  . DDD (degenerative disc disease), lumbar 11/30/2015  . Osteoarthritis of both hands 11/30/2015  . Osteoarthritis of both knees 11/30/2015  . Osteoporosis 11/30/2015  . Asthma 11/30/2015  . Anemia 11/30/2015  . Bilateral primary osteoarthritis of knee 11/26/2015  . Food intolerance 07/03/2015  . Lip numbness 06/26/2015  . Tenderness of mouth 06/26/2015  . MGUS (monoclonal gammopathy of unknown significance) 06/05/2015  . Other iron deficiency anemias 06/05/2015  . Iron malabsorption 06/05/2015  . Bleeding hemorrhoids 06/05/2015  . Lumbosacral radiculopathy at S1 07/18/2014  . Other seasonal allergic rhinitis 11/29/2013  . Venous stasis 11/29/2013  . Cramping of feet 09/10/2013  . Polymyalgia rheumatica ([place]) 03/08/2012  . Right L4-L5 Lumbosacral facet joint syndrome 02/16/2012  . Hypertriglyceridemia 09/26/2011  . Vaginal intraepithelial neoplasia III (VAIN III) 08/23/2011  . OSA (obstructive sleep apnea) 03/29/2011  . Multinodular goiter 02/28/2011  . Internal hemorrhoids 01/31/2011  . Diabetes mellitus type 2, controlled, without complications ([place]) 123456  . Essential hypertension, benign 02/23/2010  . Edema 09/23/2009    Past Medical History:  Diagnosis  Date  . Anemia   . Asthma 11/30/2015  . Bleeding hemorrhoids 06/05/2015  . DDD (degenerative disc disease), cervical 11/30/2015    . DDD (degenerative disc disease), lumbar 11/30/2015  . Diabetes mellitus   . Edema    LLE- podiatry in [place]  . Endometriosis   . Fibromyalgia 11/30/2015  . Goiter   . Goiter   . Hemorrhoids   . Hyperlipidemia   . Hypertension   . Iron malabsorption 06/05/2015  . Lumbosacral radiculopathy at S1 07/18/2014   Bilateral  . MGUS (monoclonal gammopathy of unknown significance) 06/05/2015  . Osteoarthritis of both hands 11/30/2015  . Osteoarthritis of both knees 11/30/2015  . Osteopenia   . Osteoporosis 11/30/2015  . Other iron deficiency anemias 06/05/2015  . Pain, joint, hip, right    chronic- Dr Alvan Dame Dr Nelva Bush  . Prediabetes   . Pseudogout 11/30/2015  . Sleep apnea     Family History  Problem Relation Age of Onset  . Hyperlipidemia Mother   . Cancer Father     bladder  . Heart attack Father     MI at age 40  . Diabetes Sister   . Diabetes Brother   . Diabetes Brother   . Diabetes Brother   . Diabetes Brother   . Sarcoidosis Brother   . Sarcoidosis Sister   . Anesthesia problems Neg Hx   . Hypotension Neg Hx   . Malignant hyperthermia Neg Hx   . Pseudochol deficiency Neg Hx    Past Surgical History:  Procedure Laterality Date  . Back Injections    . ESOPHAGOGASTRODUODENOSCOPY  7-11   for anemia  . FLEXIBLE SIGMOIDOSCOPY  01/31/2011   Procedure: FLEXIBLE SIGMOIDOSCOPY;  Surgeon: Missy Sabins, MD;  Location: [facility];  Service: Endoscopy;  Laterality: N/A;  . TOTAL ABDOMINAL HYSTERECTOMY  1995   bilat oophorectomy/ endometriosis  . VULVAR LESION REMOVAL  09/07/2011   Procedure: VULVAR LESION;  Surgeon: Alvino Chapel, MD;  Location: [facility];  Service: Gynecology;  Laterality: N/A;  vaginal lesion   Social History   Social History Narrative   Lives in [place] with husband and daughter. Ambulatory, no cane or walker. Works at [employer].    [employer].   Right handed.   Caffeine none.     Objective: Vital Signs:  BP 136/74   Pulse 72   Resp 16   Ht 5\' 6"  (1.676 m)   Wt 230 lb (104.3 kg)   BMI 37.12 kg/m    Physical Exam  Constitutional: She is oriented to person, place, and time. She appears well-developed and well-nourished.  HENT:  Head: Normocephalic and atraumatic.  Eyes: Conjunctivae and EOM are normal.  Neck: Normal range of motion.  Cardiovascular: Normal rate, regular rhythm, normal heart sounds and intact distal pulses.   Pulmonary/Chest: Effort normal and breath sounds normal.  Abdominal: Soft. Bowel sounds are normal.  Musculoskeletal: She exhibits edema.  Mild pitting edema on bilateral lower extremities  Lymphadenopathy:    She has no cervical adenopathy.  Neurological: She is alert and oriented to person, place, and time.  Skin: Skin is warm and dry. Capillary refill takes less than 2 seconds.  Psychiatric: She has a normal mood and affect. Her behavior is normal.  Nursing note and vitals reviewed.    Musculoskeletal Exam: C-spine and thoracic, lumbar spine good range of motion. Shoulder joints, elbow joints, wrist joints, MCPs PIPs DIPs with good range of motion with no synovitis she has  some discomfort with range of motion of her right hip joint and painful range of motion of her right knee joint without any warmth swelling or effusion although joints afford range of motion with no swelling or synovitis.  CDAI Exam: No CDAI exam completed.    Investigation: No additional findings.   Imaging: No results found.  Speciality Comments: No specialty comments available.    Procedures:  No procedures performed Allergies: Sulfamethoxazole; Pineapple; Onglyza [saxagliptin]; Trulicity [dulaglutide]; Amlodipine besylate; Asparagus; Azithromycin; Chlorthalidone; Codeine; Hctz [hydrochlorothiazide]; Metformin; Metformin and related; and Sulfonamide derivatives   Assessment / Plan:     Visit Diagnoses: Primary osteoarthritis of both knees: She continues to have discomfort in  her knee joints especially her right knee joint. I offered x-rays but she declined. Weight loss diet and exercise was discussed. I've also given her a handout on knee joint exercises. Natural anti-inflammatories were discussed..  Right hip joint pain: Patient declined x-rays. Weight loss diet and exercise was discussed and a handout was given on hip joint exercises.  Primary osteoarthritis of both hands: She is not having much discomfort  DDD cervical spine: Minimal discomfort  DDD lumbar spine: She does haven't remittent pain which goes into her right SI joint sometimes.  Obesity: Weight loss diet and exercise was discussed at length.  Osteoporosis: Followed up by PCP  Other medical problems are discussed listed as below for which she sees other physicians:  MGUS (monoclonal gammopathy of unknown significance) - Followed up by Dr. Marin Olp  Bleeding hemorrhoids  Essential hypertension, benign: Better controlled  Diabetes mellitus: Controlled per patient  Multinodular goiter  OSA (obstructive sleep apnea)  Hypertriglyceridemia  Vaginal intraepithelial neoplasia III (VAIN III)    Orders: No orders of the defined types were placed in this encounter.  No orders of the defined types were placed in this encounter.   Face-to-face time spent with patient was 30 minutes. 50% of time was spent in counseling and coordination of care.  Follow-Up Instructions: Return in about 1 year (around 01/12/2017) for Osteoarthritis.   Bo Merino, MD

## 2016-01-19 ENCOUNTER — Ambulatory Visit: Payer: Commercial Managed Care - PPO | Admitting: Neurology

## 2016-01-21 ENCOUNTER — Telehealth: Payer: Self-pay

## 2016-01-21 ENCOUNTER — Ambulatory Visit: Payer: Commercial Managed Care - PPO | Admitting: Neurology

## 2016-01-21 NOTE — Telephone Encounter (Signed)
I spoke to patient and r/s her appt, provider is out sick

## 2016-01-22 DIAGNOSIS — H2513 Age-related nuclear cataract, bilateral: Secondary | ICD-10-CM | POA: Diagnosis not present

## 2016-01-22 DIAGNOSIS — H04123 Dry eye syndrome of bilateral lacrimal glands: Secondary | ICD-10-CM | POA: Diagnosis not present

## 2016-01-22 DIAGNOSIS — H40013 Open angle with borderline findings, low risk, bilateral: Secondary | ICD-10-CM | POA: Diagnosis not present

## 2016-01-28 ENCOUNTER — Ambulatory Visit: Payer: Self-pay | Admitting: Neurology

## 2016-01-29 ENCOUNTER — Telehealth: Payer: Self-pay

## 2016-01-29 NOTE — Telephone Encounter (Signed)
I spoke to patient and was able to r/s appt due to snow.

## 2016-01-29 NOTE — Telephone Encounter (Signed)
I called the patient, from home on Wednesday (1/17), and advised the pt that the office is closed and we will call back to reschedule (due to the snow). Patient voiced understanding.

## 2016-02-19 DIAGNOSIS — E119 Type 2 diabetes mellitus without complications: Secondary | ICD-10-CM | POA: Diagnosis not present

## 2016-02-19 DIAGNOSIS — E785 Hyperlipidemia, unspecified: Secondary | ICD-10-CM | POA: Diagnosis not present

## 2016-02-19 DIAGNOSIS — G603 Idiopathic progressive neuropathy: Secondary | ICD-10-CM | POA: Diagnosis not present

## 2016-02-19 DIAGNOSIS — D509 Iron deficiency anemia, unspecified: Secondary | ICD-10-CM | POA: Diagnosis not present

## 2016-02-19 DIAGNOSIS — E1169 Type 2 diabetes mellitus with other specified complication: Secondary | ICD-10-CM | POA: Diagnosis not present

## 2016-02-19 LAB — HEMOGLOBIN A1C: HEMOGLOBIN A1C: 6.3

## 2016-02-22 ENCOUNTER — Ambulatory Visit (INDEPENDENT_AMBULATORY_CARE_PROVIDER_SITE_OTHER): Payer: Commercial Managed Care - PPO | Admitting: Neurology

## 2016-02-22 ENCOUNTER — Encounter: Payer: Self-pay | Admitting: Neurology

## 2016-02-22 VITALS — BP 154/79 | HR 76 | Resp 20 | Ht 66.0 in | Wt 221.0 lb

## 2016-02-22 DIAGNOSIS — R600 Localized edema: Secondary | ICD-10-CM | POA: Diagnosis not present

## 2016-02-22 DIAGNOSIS — R200 Anesthesia of skin: Secondary | ICD-10-CM | POA: Diagnosis not present

## 2016-02-22 NOTE — Patient Instructions (Addendum)
You have prediabetes, which can cause nerve damage.  Your exam is stable from my end of things.  I can see you back as needed.

## 2016-02-22 NOTE — Progress Notes (Signed)
Subjective:    Patient ID: Donna Dyer is a 56 y.o. female.  HPI     Interim history:    The patient's allergies, current medications, family history, past medical history, past social history, past surgical history and problem list were reviewed and updated as appropriate.    Her Past Medical History Is Significant For: Past Medical History:  Diagnosis Date  . Anemia   . Asthma 11/30/2015  . Bleeding hemorrhoids 06/05/2015  . DDD (degenerative disc disease), cervical 11/30/2015  . DDD (degenerative disc disease), lumbar 11/30/2015  . Diabetes mellitus   . Edema    LLE- podiatry in [place]  . Endometriosis   . Fibromyalgia 11/30/2015  . Goiter   . Goiter   . Hemorrhoids   . Hyperlipidemia   . Hypertension   . Iron malabsorption 06/05/2015  . Lumbosacral radiculopathy at S1 07/18/2014   Bilateral  . MGUS (monoclonal gammopathy of unknown significance) 06/05/2015  . Osteoarthritis of both hands 11/30/2015  . Osteoarthritis of both knees 11/30/2015  . Osteopenia   . Osteoporosis 11/30/2015  . Other iron deficiency anemias 06/05/2015  . Pain, joint, hip, right    chronic- Dr Alvan Dame Dr Nelva Bush  . Prediabetes   . Pseudogout 11/30/2015  . Sleep apnea     Her Past Surgical History Is Significant For: Past Surgical History:  Procedure Laterality Date  . Back Injections    . ESOPHAGOGASTRODUODENOSCOPY  7-11   for anemia  . FLEXIBLE SIGMOIDOSCOPY  01/31/2011   Procedure: FLEXIBLE SIGMOIDOSCOPY;  Surgeon: Missy Sabins, MD;  Location: [place];  Service: Endoscopy;  Laterality: N/A;  . TOTAL ABDOMINAL HYSTERECTOMY  1995   bilat oophorectomy/ endometriosis  . VULVAR LESION REMOVAL  09/07/2011   Procedure: VULVAR LESION;  Surgeon: Alvino Chapel, MD;  Location: [facility];  Service: Gynecology;  Laterality: N/A;  vaginal lesion    Her Family History Is Significant For: Family History  Problem Relation Age of Onset  . Hyperlipidemia Mother   .  Cancer Father     bladder  . Heart attack Father     MI at age 37  . Diabetes Sister   . Diabetes Brother   . Diabetes Brother   . Diabetes Brother   . Diabetes Brother   . Sarcoidosis Brother   . Sarcoidosis Sister   . Anesthesia problems Neg Hx   . Hypotension Neg Hx   . Malignant hyperthermia Neg Hx   . Pseudochol deficiency Neg Hx     Her Social History Is Significant For: Social History   Social History  . Marital status: Married    Spouse name: N/A  . Number of children: 2  . Years of education: N/A   Occupational History  . 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 Smoking status: Never Smoker  . Smokeless tobacco: Never Used  . Alcohol use No     Comment: Doesn't drink   . Drug use: No  . Sexual activity: Yes    Birth control/ protection: Surgical   Other Topics Concern  . None   Social History Narrative   Lives in [place] with husband and daughter. Ambulatory, no cane or walker. Works at [employer].    [employer].   Right handed.   Caffeine none.    Her Allergies Are:  Allergies  Allergen Reactions  . Sulfamethoxazole Rash  . Pineapple Itching and Swelling    Tongue swells  .  Onglyza [Saxagliptin] Itching  . Trulicity [Dulaglutide] Itching    ? Hypersensitivity reaction  . Amlodipine Besylate Other (See Comments)    muscle cramps  . Asparagus Itching    Of the mouth  . Azithromycin Itching and Rash  . Chlorthalidone Other (See Comments)    Muscle cramps  . Codeine Nausea And Vomiting  . Hctz [Hydrochlorothiazide] Other (See Comments)    Muscle spasms    . Metformin Diarrhea    Diarrhea when started on metformin IR 100mg  daily  . Metformin And Related Diarrhea  . Sulfonamide Derivatives Rash  :   Her Current Medications Are:  Outpatient Encounter Prescriptions as of 02/22/2016  Medication Sig  . Acetaminophen (TYLENOL EXTRA STRENGTH PO) Take 2 tablets by mouth as needed.  .  AMBULATORY NON FORMULARY MEDICATION One touch ultra strips and lancets for the One Touch Mini. Check fasting blood sugar in the morning and random blood sugar in the evening. Diagnosis Diabetes Type 2, ICD-10 code E11.9  . Ascorbic Acid (VITAMIN C) 100 MG tablet Take 100 mg by mouth daily.  Marland Kitchen atorvastatin (LIPITOR) 10 MG tablet TAKE ONE TABLET (10 MG TOTAL) BY MOUTH DAILY.  Marland Kitchen Biotin w/ Vitamins C & E (HAIR/SKIN/NAILS PO) Take by mouth.  . Calcium-Vitamin D-Vitamin K (VIACTIV PO) Take by mouth.  . Cholecalciferol (VITAMIN D) 2000 UNITS CAPS Take 2,000 Units by mouth daily.   . diclofenac sodium (VOLTAREN) 1 % GEL Apply topically 4 (four) times daily.  . fexofenadine (ALLEGRA) 180 MG tablet Take 1 tablet (180 mg total) by mouth daily.  . fluticasone (FLONASE) 50 MCG/ACT nasal spray Place into both nostrils daily.  . Multiple Vitamin (MULTIVITAMIN) tablet Take 1 tablet by mouth daily.    . nebivolol (BYSTOLIC) 10 MG tablet Take 2 tablets (20 mg total) by mouth daily.  . ONE TOUCH ULTRA TEST test strip   . verapamil (CALAN-SR) 180 MG CR tablet TAKE 1 TABLET TWICE A DAY  . [DISCONTINUED] gabapentin (NEURONTIN) 300 MG capsule 300 mg at bedtime.   No facility-administered encounter medications on file as of 02/22/2016.   :  Review of Systems:  Out of a complete 14 point review of systems, all are reviewed and negative with the exception of these symptoms as listed below: Review of Systems  Neurological: Positive for numbness.       Pt presents today to discuss the numbness in her toes. Pt says that it is the same. Pt did not like the side effects from gabapentin and therefore stopped taking it.    Objective:  Neurologic Exam  Physical Exam Physical Examination:   Vitals:   02/22/16 1555  BP: (!) 154/79  Pulse: 76  Resp: 20   General Examination: The patient is a very pleasant 56 y.o. female in no acute distress. She appears well-developed and well-nourished and well groomed.   HEENT:  Normocephalic, atraumatic, pupils are equal, round and reactive to light and accommodation. Funduscopic exam is normal with sharp disc margins noted. Extraocular tracking is good without limitation to gaze excursion or nystagmus noted. Normal smooth pursuit is noted. Hearing is grossly intact. Tympanic membranes are clear bilaterally. Face is symmetric with normal facial animation and normal facial sensation. Speech is clear with no dysarthria noted. There is no hypophonia. There is no lip, neck/head, jaw or voice tremor. Neck is supple with full range of passive and active motion. There are no carotid bruits on auscultation. Oropharynx exam reveals: mild mouth dryness, good dental hygiene and moderate airway  crowding. Mallampati is class II. Tongue protrudes centrally and palate elevates symmetrically.   Chest: Clear to auscultation without wheezing, rhonchi or crackles noted.  Heart: S1+S2+0, regular and normal without murmurs, rubs or gallops noted.   Abdomen: Soft, non-tender and non-distended with normal bowel sounds appreciated on auscultation.  Extremities: There is 1+ pitting edema in the distal lower extremities bilaterally. Pedal pulses are intact.  Skin: No new issues.   Musculoskeletal: exam reveals no obvious joint deformities, tenderness or joint swelling or erythema.   Neurologically:  Mental status: The patient is awake, alert and oriented in all 4 spheres immediate and remote memory, attention, language skills and fund of knowledge are appropriate. There is no evidence of aphasia, agnosia, apraxia or anomia. Speech is clear with normal prosody and enunciation. Thought process is linear. Mood is normal and affect is normal.  Cranial nerves II - XII are as described above under HEENT exam. In addition: shoulder shrug is normal with equal shoulder height noted. Motor exam: Normal bulk, strength and tone is noted. There is no drift, tremor or rebound. Romberg is negative. Reflexes are  2+ throughout. Babinski: Toes are flexor bilaterally. Fine motor skills and coordination: intact.  Sensory exam: intact to light touch, reports no numbness, unchanged. Gait, station and balance: She stands easily. No veering to one side is noted. No leaning to one side is noted. Posture is age-appropriate and stance is narrow based. Gait shows normal stride length and normal pace. No problems turning are noted. Tandem walk is unremarkable.  Assessment and Plan:   In summary, SHALONDA LANGEN is a very pleasant 56 y.o.-year old female with anUnderlying medical history of obesity, MGUS, anemia, sleep apnea, hyperlipidemia, hypertension, prediabetes, lower extremity edema, low back pain, endometriosis and osteoarthritis who presents for follow-up consultation of her to numbness. She has had symptoms for over 3 years, findings and symptoms are stable. Workup included EMG and nerve conduction testing in July 2016 which showed no evidence of widespread neuropathy, possibly bilateral S1 radiculopathy, she had a lumbar spine MRI in August 2016 which showed degenerative findings in lumbar spine foraminal stenosis, bilateral S1 root impingements. She has had prediabetic A1c values for the past few years. Physical exam is stable. She continues to see hematology for her MGUS. She is advised to stay well-hydrated with water and continue to work on weight loss, commended for her weight loss success so far. She is advised that he prediabetes can predispose some patients to neuropathy. At this juncture, especially in light of a stable exam for the past several months I suggested as needed follow-up with me. I answered all her questions today and she was in agreement.  I spent 15 minutes in total face-to-face time with the patient, more than 50% of which was spent in counseling and coordination of care, reviewing test results, reviewing medication and discussing or reviewing the diagnosis of toe numbness, the prognosis and  treatment options. Pertinent laboratory and imaging test results that were available during this visit with the patient were reviewed by me and considered in my medical decision making (see chart for details).

## 2016-02-23 DIAGNOSIS — D472 Monoclonal gammopathy: Secondary | ICD-10-CM | POA: Diagnosis not present

## 2016-02-23 DIAGNOSIS — I10 Essential (primary) hypertension: Secondary | ICD-10-CM | POA: Diagnosis not present

## 2016-02-23 DIAGNOSIS — E119 Type 2 diabetes mellitus without complications: Secondary | ICD-10-CM | POA: Diagnosis not present

## 2016-02-23 DIAGNOSIS — E042 Nontoxic multinodular goiter: Secondary | ICD-10-CM | POA: Diagnosis not present

## 2016-03-09 DIAGNOSIS — Z6835 Body mass index (BMI) 35.0-35.9, adult: Secondary | ICD-10-CM | POA: Diagnosis not present

## 2016-03-09 DIAGNOSIS — E041 Nontoxic single thyroid nodule: Secondary | ICD-10-CM | POA: Diagnosis not present

## 2016-03-09 DIAGNOSIS — E042 Nontoxic multinodular goiter: Secondary | ICD-10-CM | POA: Diagnosis not present

## 2016-03-11 DIAGNOSIS — E042 Nontoxic multinodular goiter: Secondary | ICD-10-CM | POA: Diagnosis not present

## 2016-03-13 DIAGNOSIS — E041 Nontoxic single thyroid nodule: Secondary | ICD-10-CM | POA: Insufficient documentation

## 2016-03-16 DIAGNOSIS — Z6835 Body mass index (BMI) 35.0-35.9, adult: Secondary | ICD-10-CM | POA: Diagnosis not present

## 2016-03-16 DIAGNOSIS — E041 Nontoxic single thyroid nodule: Secondary | ICD-10-CM | POA: Diagnosis not present

## 2016-03-18 DIAGNOSIS — H04123 Dry eye syndrome of bilateral lacrimal glands: Secondary | ICD-10-CM | POA: Diagnosis not present

## 2016-03-21 DIAGNOSIS — E042 Nontoxic multinodular goiter: Secondary | ICD-10-CM | POA: Diagnosis not present

## 2016-03-21 DIAGNOSIS — Z6835 Body mass index (BMI) 35.0-35.9, adult: Secondary | ICD-10-CM | POA: Diagnosis not present

## 2016-03-21 DIAGNOSIS — E041 Nontoxic single thyroid nodule: Secondary | ICD-10-CM | POA: Diagnosis not present

## 2016-03-23 ENCOUNTER — Other Ambulatory Visit: Payer: Self-pay

## 2016-03-23 ENCOUNTER — Telehealth: Payer: Self-pay

## 2016-03-23 MED ORDER — OSELTAMIVIR PHOSPHATE 75 MG PO CAPS: 75.0000 mg | ORAL_CAPSULE | Freq: Every day | ORAL | 0 refills | Status: DC

## 2016-03-23 NOTE — Telephone Encounter (Signed)
Pt's husband is in [place] with the Flu.  She is asking for Tamiflu for herself.  Please advise.

## 2016-03-23 NOTE — Telephone Encounter (Signed)
Ok to sent?

## 2016-03-23 NOTE — Telephone Encounter (Signed)
Notified patient.

## 2016-04-11 ENCOUNTER — Ambulatory Visit: Payer: Commercial Managed Care - PPO | Admitting: Family Medicine

## 2016-04-14 ENCOUNTER — Ambulatory Visit (INDEPENDENT_AMBULATORY_CARE_PROVIDER_SITE_OTHER): Payer: Commercial Managed Care - PPO | Admitting: Family Medicine

## 2016-04-14 ENCOUNTER — Encounter: Payer: Self-pay | Admitting: Family Medicine

## 2016-04-14 VITALS — BP 140/82 | HR 72 | Ht 66.0 in | Wt 225.0 lb

## 2016-04-14 DIAGNOSIS — I10 Essential (primary) hypertension: Secondary | ICD-10-CM | POA: Diagnosis not present

## 2016-04-14 DIAGNOSIS — E119 Type 2 diabetes mellitus without complications: Secondary | ICD-10-CM | POA: Diagnosis not present

## 2016-04-14 MED ORDER — SPIRONOLACTONE 25 MG PO TABS: 25.0000 mg | ORAL_TABLET | Freq: Every day | ORAL | 1 refills | Status: DC

## 2016-04-14 NOTE — Progress Notes (Signed)
Subjective:    CC: HTN   HPI:  Hypertension- Pt denies chest pain, SOB, dizziness, or heart palpitations.  Taking meds as directed w/o problems.  Denies medication side effects.    DM- she follow with endocrinology [facility] with Dr. Meredith Pel. She was last seen in February. Last hemoglobin A1c was 6.3. He [facility].  Past medical history, Surgical history, Family history not pertinant except as noted below, Social history, Allergies, and medications have been entered into the medical record, reviewed, and corrections made.   Review of Systems: No fevers, chills, night sweats, weight loss, chest pain, or shortness of breath.   Objective:    General: Well Developed, well nourished, and in no acute distress.  Neuro: Alert and oriented x3, extra-ocular muscles intact, sensation grossly intact.  HEENT: Normocephalic, atraumatic  Skin: Warm and dry, no rashes. Cardiac: Regular rate and rhythm, no murmurs rubs or gallops, no lower extremity edema.  Respiratory: Clear to auscultation bilaterally. Not using accessory muscles, speaking in full sentences.   Impression and Recommendations:    DM-  Will get info from endocrinology entered into the system.   HTN - Uncontrolled. Will retry spironolactone.  F/U in 3 weeks for nurse visit.  She has so Any drug intolerances. She's not able to take ACE inhibitor as her arms. She's currently on Bystolic and verapamil and has been able tolerate those well.

## 2016-04-18 ENCOUNTER — Encounter: Payer: Self-pay | Admitting: Family Medicine

## 2016-05-02 ENCOUNTER — Encounter: Payer: Self-pay | Admitting: Family Medicine

## 2016-05-04 DIAGNOSIS — J358 Other chronic diseases of tonsils and adenoids: Secondary | ICD-10-CM | POA: Diagnosis not present

## 2016-05-04 DIAGNOSIS — Z6836 Body mass index (BMI) 36.0-36.9, adult: Secondary | ICD-10-CM | POA: Diagnosis not present

## 2016-05-04 DIAGNOSIS — G4733 Obstructive sleep apnea (adult) (pediatric): Secondary | ICD-10-CM | POA: Diagnosis not present

## 2016-05-05 ENCOUNTER — Ambulatory Visit (INDEPENDENT_AMBULATORY_CARE_PROVIDER_SITE_OTHER): Payer: Commercial Managed Care - PPO | Admitting: Family Medicine

## 2016-05-05 VITALS — BP 149/67 | HR 64 | Wt 226.0 lb

## 2016-05-05 DIAGNOSIS — G4710 Hypersomnia, unspecified: Secondary | ICD-10-CM

## 2016-05-05 DIAGNOSIS — I10 Essential (primary) hypertension: Secondary | ICD-10-CM

## 2016-05-05 NOTE — Progress Notes (Signed)
Ok to stop the medication.  How does she feel about a consultation with cardiology to help with medication management.    Donna Lecher, MD

## 2016-05-05 NOTE — Progress Notes (Signed)
Pt is in today for a blood pressure check. She states that she has been taking 12.5 MG of spirolactone per Donna Dyer,CATHERINE, MD recommendation due to it making her feel bad. She is still experiencing nausea, dizziness, fast heart rate on the 12.5 dose. Initial BP 145/72. BP 149/67 on recheck after sitting for 10 minutes.

## 2016-05-05 NOTE — Progress Notes (Signed)
Unable to reach pt. Will call back later

## 2016-05-06 NOTE — Addendum Note (Signed)
Addended by: Beatrice Lecher D on: 05/06/2016 05:06 PM   Modules accepted: Orders

## 2016-05-06 NOTE — Progress Notes (Signed)
Pt notified of recommendations. She would like to be referred to cardiology. Advised pt that she will hear from cardiology once referral is placed.

## 2016-05-27 NOTE — Progress Notes (Signed)
Referring-Donna Madilyn Fireman, MD Reason for referral-Hypertension  HPI: 56 year old female for evaluation of hypertension at the request of Dr. Beatrice Lecher. Patient seen in this office previously but not since October 2012. Echocardiogram in October of 2012 showed normal LV function, grade 1 diastolic dysfunction, and mild left atrial enlargement. Patient denies dyspnea, chest pain, palpitations or syncope. She does have occasional pedal edema. She has been intolerant to ARB's, spironolactone and Norvasc per her report. She states HCTZ caused "cramps". Because of the above we were asked to evaluate.  Current Outpatient Prescriptions  Medication Sig Dispense Refill  . Acetaminophen (TYLENOL EXTRA STRENGTH PO) Take 2 tablets by mouth as needed.    . AMBULATORY NON FORMULARY MEDICATION One touch ultra strips and lancets for the One Touch Mini. Check fasting blood sugar in the morning and random blood sugar in the evening. Diagnosis Diabetes Type 2, ICD-10 code E11.9 3 each 3  . Ascorbic Acid (VITAMIN C) 100 MG tablet Take 100 mg by mouth daily.    . Biotin w/ Vitamins C & E (HAIR/SKIN/NAILS PO) Take by mouth.    . Calcium-Vitamin D-Vitamin K (VIACTIV PO) Take by mouth.    . Cholecalciferol (VITAMIN D) 2000 UNITS CAPS Take 2,000 Units by mouth daily.     . fexofenadine (ALLEGRA) 180 MG tablet Take 1 tablet (180 mg total) by mouth daily. 90 tablet 1  . fluticasone (FLONASE) 50 MCG/ACT nasal spray Place into both nostrils daily.    . Multiple Vitamin (MULTIVITAMIN) tablet Take 1 tablet by mouth daily.      . nebivolol (BYSTOLIC) 10 MG tablet Take 2 tablets (20 mg total) by mouth daily. 180 tablet 1  . ONE TOUCH ULTRA TEST test strip     . verapamil (CALAN-SR) 180 MG CR tablet TAKE 1 TABLET TWICE A DAY 180 tablet 2   No current facility-administered medications for this visit.     Allergies  Allergen Reactions  . Sulfamethoxazole Rash  . Pineapple Itching and Swelling    Tongue  swells  . Avapro [Irbesartan] Other (See Comments)  . Diovan [Valsartan] Other (See Comments)    Leg cramps   . Onglyza [Saxagliptin] Itching  . Spironolactone Other (See Comments)    Dizzy and nauseated.   Danelle Berry [Dulaglutide] Itching    ? Hypersensitivity reaction  . Amlodipine Besylate Other (See Comments)    muscle cramps  . Asparagus Itching    Of the mouth  . Azithromycin Itching and Rash  . Chlorthalidone Other (See Comments)    Muscle cramps  . Codeine Nausea And Vomiting  . Hctz [Hydrochlorothiazide] Other (See Comments)    Muscle spasms    . Metformin Diarrhea    Diarrhea when started on metformin IR 100mg  daily Diarrhea when started on metformin IR 100mg  daily Diarrhea when started on metformin IR 100mg  daily  . Metformin And Related Diarrhea  . Sulfonamide Derivatives Rash     Past Medical History:  Diagnosis Date  . Anemia   . Asthma 11/30/2015  . Bleeding hemorrhoids 06/05/2015  . DDD (degenerative disc disease), cervical 11/30/2015  . DDD (degenerative disc disease), lumbar 11/30/2015  . Diabetes mellitus   . Edema    LLE- podiatry in [place]  . Endometriosis   . Fibromyalgia 11/30/2015  . Goiter   . Hemorrhoids   . Hyperlipidemia   . Hypertension   . Iron malabsorption 06/05/2015  . Lumbosacral radiculopathy at S1 07/18/2014   Bilateral  . MGUS (monoclonal gammopathy of unknown significance)  06/05/2015  . Osteoarthritis of both hands 11/30/2015  . Osteoarthritis of both knees 11/30/2015  . Osteopenia   . Osteoporosis 11/30/2015  . Other iron deficiency anemias 06/05/2015  . Pain, joint, hip, right    chronic- Dr Alvan Dame Dr Nelva Bush  . Prediabetes   . Pseudogout 11/30/2015  . Sleep apnea     Past Surgical History:  Procedure Laterality Date  . Back Injections    . ESOPHAGOGASTRODUODENOSCOPY  7-11   for anemia  . FLEXIBLE SIGMOIDOSCOPY  01/31/2011   Procedure: FLEXIBLE SIGMOIDOSCOPY;  Surgeon: Missy Sabins, MD;  Location: [facility];  Service:  Endoscopy;  Laterality: N/A;  . TOTAL ABDOMINAL HYSTERECTOMY  1995   bilat oophorectomy/ endometriosis  . VULVAR LESION REMOVAL  09/07/2011   Procedure: VULVAR LESION;  Surgeon: Alvino Chapel, MD;  Location: [facility];  Service: Gynecology;  Laterality: N/A;  vaginal lesion    Social History   Social History  . Marital status: Married    Spouse name: N/A  . Number of children: 2  . Years of education: N/A   Occupational History  . BILLING OFFICE [employer]   Social History Main Topics  . Smoking status: Never Smoker  . Smokeless tobacco: Never Used  . Alcohol use No     Comment: Doesn't drink   . Drug use: No  . Sexual activity: Yes    Birth control/ protection: Surgical   Other Topics Concern  . Not on file   Social History Narrative   Lives in [place] with husband and daughter. Ambulatory, no cane or walker. Works at [employer].    [employer].   Right handed.   Caffeine none.    Family History  Problem Relation Age of Onset  . Hyperlipidemia Mother   . Cancer Father        bladder  . Heart attack Father        MI at age 29  . Diabetes Sister   . Diabetes Brother   . Diabetes Brother   . Diabetes Brother   . Diabetes Brother   . Sarcoidosis Brother   . Sarcoidosis Sister   . Anesthesia problems Neg Hx   . Hypotension Neg Hx   . Malignant hyperthermia Neg Hx   . Pseudochol deficiency Neg Hx     ROS: Occasional hematochezia that she attributes to hemorrhoids but no fevers or chills, productive cough, hemoptysis, dysphasia, odynophagia, melena, dysuria, hematuria, rash, seizure activity, orthopnea, PND, claudication. Remaining systems are negative.  Physical Exam:   Blood pressure 137/81, pulse 61, height 5\' 6"  (1.676 m), weight 102 kg (224 lb 12.8 oz).  General:  Well developed/obese in NAD Skin warm/dry Patient not depressed No peripheral clubbing Back-normal HEENT-normal/normal  eyelids Neck supple/normal carotid upstroke bilaterally; no bruits; no JVD; no thyromegaly chest - CTA/ normal expansion CV - RRR/normal S1 and S2; no murmurs, rubs or gallops;  PMI nondisplaced Abdomen -NT/ND, no HSM, no mass, + bowel sounds, no bruit 2+ femoral pulses, no bruits Ext-1+edema, no chords, 2+ DP Neuro-grossly nonfocal  ECG - Sinus rhythm at a rate of 61. No significant ST changes. personally reviewed  A/P  1 hypertension-blood pressure is typically 140 at home. She has multiple drug intolerances by report. She does have pedal edema. She takes Lasix as needed but not often. I will discontinue this medication. Instead we will treat with hydrochlorothiazide 25 mg daily. Check potassium and renal function in 1 week. Follow blood  pressure at home and we will adjust regimen as needed. Bring blood pressure cuff to next office visit to correlate with ours. I discussed lifestyle modification. She does not smoke or consume alcohol. We discussed low-sodium diet, diet and exercise as well as weight loss.  2 diabetes mellitus-management per primary care.   3 hyperlipidemia-management per primary care.      Kirk Ruths, MD

## 2016-06-01 ENCOUNTER — Encounter: Payer: Self-pay | Admitting: Cardiology

## 2016-06-01 ENCOUNTER — Telehealth: Payer: Self-pay | Admitting: Cardiology

## 2016-06-01 ENCOUNTER — Ambulatory Visit (INDEPENDENT_AMBULATORY_CARE_PROVIDER_SITE_OTHER): Payer: Commercial Managed Care - PPO | Admitting: Cardiology

## 2016-06-01 VITALS — BP 137/81 | HR 61 | Ht 66.0 in | Wt 224.8 lb

## 2016-06-01 DIAGNOSIS — I10 Essential (primary) hypertension: Secondary | ICD-10-CM | POA: Diagnosis not present

## 2016-06-01 DIAGNOSIS — E7800 Pure hypercholesterolemia, unspecified: Secondary | ICD-10-CM | POA: Diagnosis not present

## 2016-06-01 MED ORDER — HYDROCHLOROTHIAZIDE 25 MG PO TABS: 25.0000 mg | ORAL_TABLET | Freq: Every day | ORAL | 3 refills | Status: DC

## 2016-06-01 NOTE — Telephone Encounter (Signed)
Spoke with pt, she reports she is going out of town over the weekend and does not want to deal with a rash from the HCTZ. She is allergic to sulfa but has taken furosemide before without problems. Okay per dr Stanford Breed for patient to start the HCTZ when she returns to town. Pt agreed with this plan.

## 2016-06-01 NOTE — Telephone Encounter (Signed)
[place]calfe said that since she has a sulfa allergy she may not want to take th hydrochlorothiazide (HYDRODIURIL) 25 MG tablet Take 1 tablet (25 mg total) by mouth daily   Can you give her something else

## 2016-06-01 NOTE — Telephone Encounter (Signed)
Check with pt; I do not think she had a rash with hctz previously and has not had with lasix; if not would try hctz as previously directed Kirk Ruths

## 2016-06-01 NOTE — Telephone Encounter (Signed)
Message routed to MD and pharmacy to advise

## 2016-06-01 NOTE — Patient Instructions (Signed)
Medication Instructions:   STOP FUROSEMIDE  START HCTZ 25 MG ONCE DAILY  Labwork:  Your physician recommends that you return for lab work in: [place]:  Your physician recommends that you schedule a follow-up appointment in: [place]

## 2016-06-10 ENCOUNTER — Other Ambulatory Visit (HOSPITAL_BASED_OUTPATIENT_CLINIC_OR_DEPARTMENT_OTHER): Payer: Commercial Managed Care - PPO

## 2016-06-10 ENCOUNTER — Ambulatory Visit (HOSPITAL_BASED_OUTPATIENT_CLINIC_OR_DEPARTMENT_OTHER): Payer: Commercial Managed Care - PPO | Admitting: Hematology & Oncology

## 2016-06-10 VITALS — BP 146/76 | HR 61 | Temp 98.0°F | Resp 16 | Wt 223.0 lb

## 2016-06-10 DIAGNOSIS — D649 Anemia, unspecified: Secondary | ICD-10-CM | POA: Diagnosis not present

## 2016-06-10 DIAGNOSIS — D472 Monoclonal gammopathy: Secondary | ICD-10-CM

## 2016-06-10 DIAGNOSIS — K909 Intestinal malabsorption, unspecified: Secondary | ICD-10-CM

## 2016-06-10 LAB — CBC WITH DIFFERENTIAL (CANCER CENTER ONLY)
BASO#: 0 10*3/uL (ref 0.0–0.2)
BASO%: 0.3 % (ref 0.0–2.0)
EOS%: 2.1 % (ref 0.0–7.0)
Eosinophils Absolute: 0.2 10*3/uL (ref 0.0–0.5)
HCT: 34.3 % — ABNORMAL LOW (ref 34.8–46.6)
HEMOGLOBIN: 11.6 g/dL (ref 11.6–15.9)
LYMPH#: 2.4 10*3/uL (ref 0.9–3.3)
LYMPH%: 33.9 % (ref 14.0–48.0)
MCH: 28.3 pg (ref 26.0–34.0)
MCHC: 33.8 g/dL (ref 32.0–36.0)
MCV: 84 fL (ref 81–101)
MONO#: 0.5 10*3/uL (ref 0.1–0.9)
MONO%: 7.4 % (ref 0.0–13.0)
NEUT%: 56.3 % (ref 39.6–80.0)
NEUTROS ABS: 4 10*3/uL (ref 1.5–6.5)
Platelets: 295 10*3/uL (ref 145–400)
RBC: 4.1 10*6/uL (ref 3.70–5.32)
RDW: 13.9 % (ref 11.1–15.7)
WBC: 7.1 10*3/uL (ref 3.9–10.0)

## 2016-06-10 LAB — COMPREHENSIVE METABOLIC PANEL
ALBUMIN: 3.9 g/dL (ref 3.5–5.0)
ALK PHOS: 122 U/L (ref 40–150)
ALT: 17 U/L (ref 0–55)
ANION GAP: 10 meq/L (ref 3–11)
AST: 20 U/L (ref 5–34)
BUN: 22.4 mg/dL (ref 7.0–26.0)
CALCIUM: 9.6 mg/dL (ref 8.4–10.4)
CO2: 26 meq/L (ref 22–29)
Chloride: 101 mEq/L (ref 98–109)
Creatinine: 1.2 mg/dL — ABNORMAL HIGH (ref 0.6–1.1)
EGFR: 60 mL/min/{1.73_m2} — AB (ref >90)
Glucose: 122 mg/dl (ref 70–140)
Potassium: 4 mEq/L (ref 3.5–5.1)
Sodium: 136 mEq/L (ref 136–145)
Total Bilirubin: 0.61 mg/dL (ref 0.20–1.20)
Total Protein: 8.8 g/dL — ABNORMAL HIGH (ref 6.4–8.3)

## 2016-06-10 LAB — FERRITIN: FERRITIN: 121 ng/mL (ref 9–269)

## 2016-06-10 LAB — IRON AND TIBC
%SAT: 17 % — ABNORMAL LOW (ref 21–57)
IRON: 56 ug/dL (ref 41–142)
TIBC: 324 ug/dL (ref 236–444)
UIBC: 268 ug/dL (ref 120–384)

## 2016-06-10 NOTE — Progress Notes (Signed)
Hematology and Oncology Follow Up Visit  Donna Dyer [id number] 1960-05-26 56 y.o. 06/10/2016   Principle Diagnosis:  IgG kappa monoclonal gammopathy of undetermined significance. 2. Anemia - possible iron deficiency.  Current Therapy:    Observation     Interim History:  Ms.  Sunderlin is back for followup. She sees be doing okay. We last saw her back in May. She had iron back in December of 2016. She does not absorb iron because of all of her medications. The iron really has helped her.  Her last myeloma studies have been holding very steady. Her last M spike was 1.4 g/dL.  Her IgG level was 2380 mg/dL. Her Kappa Lightchain was up a little bit at 7.2 mg/dL.  She had no problems over the wintertime. Her husband got the flu. Thank you, she did not.  She's had no issues with nausea or vomiting. She's had no rashes. There's been no change in bowel or bladder habits.  Overall, her performance status is ECOG 0.  Medications:  Current Outpatient Prescriptions:  .  Acetaminophen (TYLENOL EXTRA STRENGTH PO), Take 2 tablets by mouth as needed., Disp: , Rfl:  .  AMBULATORY NON FORMULARY MEDICATION, One touch ultra strips and lancets for the One Touch Mini. Check fasting blood sugar in the morning and random blood sugar in the evening. Diagnosis Diabetes Type 2, ICD-10 code E11.9, Disp: 3 each, Rfl: 3 .  Ascorbic Acid (VITAMIN C) 100 MG tablet, Take 100 mg by mouth daily., Disp: , Rfl:  .  Biotin w/ Vitamins C & E (HAIR/SKIN/NAILS PO), Take by mouth., Disp: , Rfl:  .  Calcium-Vitamin D-Vitamin K (VIACTIV PO), Take by mouth., Disp: , Rfl:  .  Cholecalciferol (VITAMIN D) 2000 UNITS CAPS, Take 2,000 Units by mouth daily. , Disp: , Rfl:  .  fexofenadine (ALLEGRA) 180 MG tablet, Take 1 tablet (180 mg total) by mouth daily., Disp: 90 tablet, Rfl: 1 .  fluticasone (FLONASE) 50 MCG/ACT nasal spray, Place into both nostrils daily., Disp: , Rfl:  .  hydrochlorothiazide (HYDRODIURIL) 25 MG tablet,  Take 1 tablet (25 mg total) by mouth daily., Disp: 90 tablet, Rfl: 3 .  Multiple Vitamin (MULTIVITAMIN) tablet, Take 1 tablet by mouth daily.  , Disp: , Rfl:  .  nebivolol (BYSTOLIC) 10 MG tablet, Take 2 tablets (20 mg total) by mouth daily., Disp: 180 tablet, Rfl: 1 .  ONE TOUCH ULTRA TEST test strip, , Disp: , Rfl:  .  verapamil (CALAN-SR) 180 MG CR tablet, TAKE 1 TABLET TWICE A DAY, Disp: 180 tablet, Rfl: 2  Allergies:  Allergies  Allergen Reactions  . Sulfamethoxazole Rash  . Pineapple Itching and Swelling    Tongue swells  . Avapro [Irbesartan] Other (See Comments)  . Diovan [Valsartan] Other (See Comments)    Leg cramps   . Onglyza [Saxagliptin] Itching  . Spironolactone Other (See Comments)    Dizzy and nauseated.   Danelle Berry [Dulaglutide] Itching    ? Hypersensitivity reaction  . Amlodipine Besylate Other (See Comments)    muscle cramps  . Asparagus Itching    Of the mouth  . Azithromycin Itching and Rash  . Chlorthalidone Other (See Comments)    Muscle cramps  . Codeine Nausea And Vomiting  . Hctz [Hydrochlorothiazide] Other (See Comments)    Muscle spasms    . Metformin Diarrhea    Diarrhea when started on metformin IR 100mg  daily Diarrhea when started on metformin IR 100mg  daily Diarrhea when started on metformin  IR 100mg  daily  . Metformin And Related Diarrhea  . Sulfonamide Derivatives Rash    Past Medical History, Surgical history, Social history, and Family History were reviewed and updated.  Review of Systems: As above  Physical Exam:  weight is 223 lb (101.2 kg). Her oral temperature is 98 F (36.7 C). Her blood pressure is 146/76 (abnormal) and her pulse is 61. Her respiration is 16 and oxygen saturation is 99%.   Obese African-American female in no obvious distress. Head and neck exam shows no ocular or oral lesions. Pupils reacted properly. Lungs are clear. Cardiac exam regular rate and rhythm with no murmurs rubs or bruits. Abdomen soft. She is  obese. Good bowel sounds. There is no fluid wave. There is no palpable liver or spleen tip. Back exam shows no tenderness over the spine ribs or hips. Extremities shows some 1+ edema in her lower legs. She has no joint swelling or erythema. Muscle strength is adequate. Strength is good. Skin exam shows  no rashes ecchymoses or petechia. Neurological exam shows no focal neurological deficits.  Lab Results  Component Value Date   WBC 7.1 06/10/2016   HGB 11.6 06/10/2016   HCT 34.3 (L) 06/10/2016   MCV 84 06/10/2016   PLT 295 06/10/2016     Chemistry      Component Value Date/Time   NA 136 12/11/2015 0813   K 3.8 12/11/2015 0813   CL 103 04/11/2014 0745   CL 101 04/26/2013 0805   CO2 25 12/11/2015 0813   BUN 11.6 12/11/2015 0813   CREATININE 1.0 12/11/2015 0813   GLU 153 04/15/2014      Component Value Date/Time   CALCIUM 9.3 12/11/2015 0813   ALKPHOS 141 12/11/2015 0813   AST 20 12/11/2015 0813   ALT 19 12/11/2015 0813   BILITOT 0.73 12/11/2015 0813       Impression and Plan: Ms. Kuras is 56 year old African American female. She has an IgG kappa monoclonal spike. I think this is an MGUS.  I notice that her hemoglobin is slowly trending downward. We'll have to watch this closely. I think her monoclonal studies will be very important.   I would still like to keep her at 6 month follow-up. If there is a marked change in her labs, then we will get her in sooner.   Her faith remains very strong. Marland Kitchen  [name], MD 6/1/20188:27 AM

## 2016-06-13 LAB — KAPPA/LAMBDA LIGHT CHAINS
IG LAMBDA FREE LIGHT CHAIN: 15.9 mg/L (ref 5.7–26.3)
Ig Kappa Free Light Chain: 95.1 mg/L — ABNORMAL HIGH (ref 3.3–19.4)
Kappa/Lambda FluidC Ratio: 5.98 — ABNORMAL HIGH (ref 0.26–1.65)

## 2016-06-14 LAB — PROTEIN ELECTROPHORESIS, SERUM, WITH REFLEX
A/G RATIO SPE: 0.8 (ref 0.7–1.7)
ALBUMIN: 3.6 g/dL (ref 2.9–4.4)
Alpha 1: 0.2 g/dL (ref 0.0–0.4)
Alpha 2: 0.9 g/dL (ref 0.4–1.0)
BETA: 1.3 g/dL (ref 0.7–1.3)
Gamma Globulin: 2.1 g/dL — ABNORMAL HIGH (ref 0.4–1.8)
Globulin, Total: 4.6 g/dL — ABNORMAL HIGH (ref 2.2–3.9)
INTERPRETATION(SEE BELOW): 0
IgA, Qn, Serum: 268 mg/dL (ref 87–352)
IgM, Qn, Serum: 78 mg/dL (ref 26–217)
M-SPIKE, %: 1.4 g/dL — AB
TOTAL PROTEIN: 8.2 g/dL (ref 6.0–8.5)

## 2016-06-24 DIAGNOSIS — I10 Essential (primary) hypertension: Secondary | ICD-10-CM | POA: Diagnosis not present

## 2016-06-25 LAB — BASIC METABOLIC PANEL
BUN/Creatinine Ratio: 20 (ref 9–23)
BUN: 23 mg/dL (ref 6–24)
CALCIUM: 9.5 mg/dL (ref 8.7–10.2)
CO2: 26 mmol/L (ref 20–29)
CREATININE: 1.15 mg/dL — AB (ref 0.57–1.00)
Chloride: 99 mmol/L (ref 96–106)
GFR calc Af Amer: 61 mL/min/{1.73_m2} (ref >59)
GFR, EST NON AFRICAN AMERICAN: 53 mL/min/{1.73_m2} — AB (ref >59)
GLUCOSE: 171 mg/dL — AB (ref 65–99)
Potassium: 4.2 mmol/L (ref 3.5–5.2)
Sodium: 139 mmol/L (ref 134–144)

## 2016-06-26 ENCOUNTER — Encounter: Payer: Self-pay | Admitting: Cardiology

## 2016-06-27 ENCOUNTER — Other Ambulatory Visit: Payer: Self-pay | Admitting: *Deleted

## 2016-06-27 MED ORDER — NEBIVOLOL HCL 10 MG PO TABS: 20.0000 mg | ORAL_TABLET | Freq: Every day | ORAL | 1 refills | Status: DC

## 2016-07-28 NOTE — Progress Notes (Signed)
HPI: FU hypertension. Echocardiogram in October of 2012 showed normal LV function, grade 1 diastolic dysfunction, and mild left atrial enlargement. She has been intolerant to ARB's, spironolactone and Norvasc per her report. She states HCTZ caused "cramps". HCTZ added at last ov. Since last seen, the patient denies any dyspnea on exertion, orthopnea, PND, pedal edema, palpitations, syncope or chest pain.   Current Outpatient Prescriptions  Medication Sig Dispense Refill  . Acetaminophen (TYLENOL EXTRA STRENGTH PO) Take 2 tablets by mouth as needed.    . AMBULATORY NON FORMULARY MEDICATION One touch ultra strips and lancets for the One Touch Mini. Check fasting blood sugar in the morning and random blood sugar in the evening. Diagnosis Diabetes Type 2, ICD-10 code E11.9 3 each 3  . Ascorbic Acid (VITAMIN C) 100 MG tablet Take 100 mg by mouth daily.    . Biotin w/ Vitamins C & E (HAIR/SKIN/NAILS PO) Take by mouth.    . Calcium-Vitamin D-Vitamin K (VIACTIV PO) Take by mouth.    . Cholecalciferol (VITAMIN D) 2000 UNITS CAPS Take 2,000 Units by mouth daily.     . fexofenadine (ALLEGRA) 180 MG tablet Take 1 tablet (180 mg total) by mouth daily. 90 tablet 1  . fluticasone (FLONASE) 50 MCG/ACT nasal spray Place into both nostrils daily.    . hydrochlorothiazide (HYDRODIURIL) 25 MG tablet Take 1 tablet (25 mg total) by mouth daily. 90 tablet 3  . Multiple Vitamin (MULTIVITAMIN) tablet Take 1 tablet by mouth daily.      . nebivolol (BYSTOLIC) 10 MG tablet Take 2 tablets (20 mg total) by mouth daily. 180 tablet 1  . ONE TOUCH ULTRA TEST test strip     . verapamil (CALAN-SR) 180 MG CR tablet TAKE 1 TABLET TWICE A DAY 180 tablet 2   No current facility-administered medications for this visit.      Past Medical History:  Diagnosis Date  . Anemia   . Asthma 11/30/2015  . Bleeding hemorrhoids 06/05/2015  . DDD (degenerative disc disease), cervical 11/30/2015  . DDD (degenerative disc disease),  lumbar 11/30/2015  . Diabetes mellitus   . Edema    LLE- podiatry in [place]  . Endometriosis   . Fibromyalgia 11/30/2015  . Goiter   . Hemorrhoids   . Hyperlipidemia   . Hypertension   . Iron malabsorption 06/05/2015  . Lumbosacral radiculopathy at S1 07/18/2014   Bilateral  . MGUS (monoclonal gammopathy of unknown significance) 06/05/2015  . Osteoarthritis of both hands 11/30/2015  . Osteoarthritis of both knees 11/30/2015  . Osteopenia   . Osteoporosis 11/30/2015  . Other iron deficiency anemias 06/05/2015  . Pain, joint, hip, right    chronic- Dr Alvan Dame Dr Nelva Bush  . Prediabetes   . Pseudogout 11/30/2015  . Sleep apnea     Past Surgical History:  Procedure Laterality Date  . Back Injections    . ESOPHAGOGASTRODUODENOSCOPY  7-11   for anemia  . FLEXIBLE SIGMOIDOSCOPY  01/31/2011   Procedure: FLEXIBLE SIGMOIDOSCOPY;  Surgeon: Missy Sabins, MD;  Location: [facility];  Service: Endoscopy;  Laterality: N/A;  . TOTAL ABDOMINAL HYSTERECTOMY  1995   bilat oophorectomy/ endometriosis  . VULVAR LESION REMOVAL  09/07/2011   Procedure: VULVAR LESION;  Surgeon: Alvino Chapel, MD;  Location: [facility];  Service: Gynecology;  Laterality: N/A;  vaginal lesion    Social History   Social History  . Marital status: Married    Spouse name: N/A  . Number of  children: 2  . 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 Smoking status: Never Smoker  . Smokeless tobacco: Never Used  . Alcohol use No     Comment: Doesn't drink   . Drug use: No  . Sexual activity: Yes    Birth control/ protection: Surgical   Other Topics Concern  . Not on file   Social History Narrative   Lives in [place] with husband and daughter. Ambulatory, no cane or walker. Works at [employer].    [employer].   Right handed.   Caffeine none.    Family History  Problem Relation Age of  Onset  . Hyperlipidemia Mother   . Cancer Father        bladder  . Heart attack Father        MI at age 62  . Diabetes Sister   . Diabetes Brother   . Diabetes Brother   . Diabetes Brother   . Diabetes Brother   . Sarcoidosis Brother   . Sarcoidosis Sister   . Anesthesia problems Neg Hx   . Hypotension Neg Hx   . Malignant hyperthermia Neg Hx   . Pseudochol deficiency Neg Hx     ROS: no fevers or chills, productive cough, hemoptysis, dysphasia, odynophagia, melena, hematochezia, dysuria, hematuria, rash, seizure activity, orthopnea, PND, pedal edema, claudication. Remaining systems are negative.  Physical Exam: Well-developed well-nourished in no acute distress.  Skin is warm and dry.  HEENT is normal.  Neck is supple.  Chest is clear to auscultation with normal expansion.  Cardiovascular exam is regular rate and rhythm.  Abdominal exam nontender or distended. No masses palpated. Extremities show no edema. neuro grossly intact    A/P  1 Hypertension-blood pressure is now controlled. We will continue with present regimen.   2 hyperlipidemia-management per primary care.  3 diabetes mellitus-management per primary care.   Kirk Ruths, MD

## 2016-08-03 DIAGNOSIS — Z6836 Body mass index (BMI) 36.0-36.9, adult: Secondary | ICD-10-CM | POA: Diagnosis not present

## 2016-08-03 DIAGNOSIS — J358 Other chronic diseases of tonsils and adenoids: Secondary | ICD-10-CM | POA: Diagnosis not present

## 2016-08-03 DIAGNOSIS — G4733 Obstructive sleep apnea (adult) (pediatric): Secondary | ICD-10-CM | POA: Diagnosis not present

## 2016-08-10 ENCOUNTER — Ambulatory Visit (INDEPENDENT_AMBULATORY_CARE_PROVIDER_SITE_OTHER): Payer: Commercial Managed Care - PPO | Admitting: Cardiology

## 2016-08-10 ENCOUNTER — Encounter: Payer: Self-pay | Admitting: Cardiology

## 2016-08-10 VITALS — BP 124/79 | HR 72 | Ht 66.0 in | Wt 223.8 lb

## 2016-08-10 DIAGNOSIS — I10 Essential (primary) hypertension: Secondary | ICD-10-CM

## 2016-08-10 DIAGNOSIS — E7800 Pure hypercholesterolemia, unspecified: Secondary | ICD-10-CM | POA: Diagnosis not present

## 2016-08-10 NOTE — Patient Instructions (Signed)
Your physician recommends that you schedule a follow-up appointment in: AS NEEDED  

## 2016-08-23 DIAGNOSIS — E042 Nontoxic multinodular goiter: Secondary | ICD-10-CM | POA: Diagnosis not present

## 2016-08-23 DIAGNOSIS — E119 Type 2 diabetes mellitus without complications: Secondary | ICD-10-CM | POA: Diagnosis not present

## 2016-08-23 DIAGNOSIS — I10 Essential (primary) hypertension: Secondary | ICD-10-CM | POA: Diagnosis not present

## 2016-08-23 DIAGNOSIS — D472 Monoclonal gammopathy: Secondary | ICD-10-CM | POA: Diagnosis not present

## 2016-09-02 ENCOUNTER — Telehealth: Payer: Self-pay | Admitting: Cardiology

## 2016-09-02 DIAGNOSIS — I10 Essential (primary) hypertension: Secondary | ICD-10-CM

## 2016-09-02 MED ORDER — LOSARTAN POTASSIUM 50 MG PO TABS: 50.0000 mg | ORAL_TABLET | Freq: Every day | ORAL | 3 refills | Status: DC

## 2016-09-02 NOTE — Telephone Encounter (Signed)
Cozaar 50 mg daily, bmet one week Kirk Ruths

## 2016-09-02 NOTE — Telephone Encounter (Signed)
New Message  .pt call requesting to speak with RN. Pt states she is having she issues with medication HCTZ. Pt has questions about medication. Please call back to discuss

## 2016-09-02 NOTE — Telephone Encounter (Signed)
Spoke with pt, Aware of dr crenshaw's recommendations. New script sent to the pharmacy and Lab orders mailed to the pt  

## 2016-09-02 NOTE — Telephone Encounter (Signed)
Spoke with patient and she stated her PCP decreased her HCTZ 25 mg to 1/2 tablet secondary to elevated kidney functions on   08/19/16  02/19/16  Na 137   Na 136  K+ 4.2   K+ 4.0  Bun 21   BUN 27  Cr 1.23  Cr 0.94  Since she decreased dose she has been having muscle cramping and blood pressure going up 146/88   Will forward to Dr Stanford Breed for review

## 2016-09-06 ENCOUNTER — Telehealth: Payer: Self-pay | Admitting: *Deleted

## 2016-09-06 NOTE — Telephone Encounter (Addendum)
Patient aware of results  ----- Message from [name], MD sent at 09/06/2016  7:01 AM EDT ----- Call - the protein studies for the abnormal protein in your blood are holding stable!!!  Have a great Labor Day weekend!!!  pete

## 2016-11-11 DIAGNOSIS — Z1231 Encounter for screening mammogram for malignant neoplasm of breast: Secondary | ICD-10-CM | POA: Diagnosis not present

## 2016-11-14 LAB — HM MAMMOGRAPHY

## 2016-11-15 DIAGNOSIS — R7303 Prediabetes: Secondary | ICD-10-CM | POA: Insufficient documentation

## 2016-12-16 ENCOUNTER — Ambulatory Visit (HOSPITAL_BASED_OUTPATIENT_CLINIC_OR_DEPARTMENT_OTHER): Payer: PRIVATE HEALTH INSURANCE | Admitting: Hematology & Oncology

## 2016-12-16 ENCOUNTER — Encounter: Payer: Self-pay | Admitting: Hematology & Oncology

## 2016-12-16 ENCOUNTER — Other Ambulatory Visit: Payer: Self-pay

## 2016-12-16 ENCOUNTER — Other Ambulatory Visit: Payer: PRIVATE HEALTH INSURANCE

## 2016-12-16 VITALS — BP 150/75 | HR 58 | Temp 98.0°F | Resp 16 | Wt 224.0 lb

## 2016-12-16 DIAGNOSIS — D649 Anemia, unspecified: Secondary | ICD-10-CM | POA: Diagnosis not present

## 2016-12-16 DIAGNOSIS — K909 Intestinal malabsorption, unspecified: Secondary | ICD-10-CM

## 2016-12-16 DIAGNOSIS — D472 Monoclonal gammopathy: Secondary | ICD-10-CM

## 2016-12-16 LAB — CBC WITH DIFFERENTIAL (CANCER CENTER ONLY)
BASO#: 0 10*3/uL (ref 0.0–0.2)
BASO%: 0.1 % (ref 0.0–2.0)
EOS ABS: 0.1 10*3/uL (ref 0.0–0.5)
EOS%: 1.5 % (ref 0.0–7.0)
HEMATOCRIT: 33.1 % — AB (ref 34.8–46.6)
HEMOGLOBIN: 10.8 g/dL — AB (ref 11.6–15.9)
LYMPH#: 2.1 10*3/uL (ref 0.9–3.3)
LYMPH%: 29.1 % (ref 14.0–48.0)
MCH: 27.7 pg (ref 26.0–34.0)
MCHC: 32.6 g/dL (ref 32.0–36.0)
MCV: 85 fL (ref 81–101)
MONO#: 0.5 10*3/uL (ref 0.1–0.9)
MONO%: 6.6 % (ref 0.0–13.0)
NEUT#: 4.5 10*3/uL (ref 1.5–6.5)
NEUT%: 62.7 % (ref 39.6–80.0)
PLATELETS: 300 10*3/uL (ref 145–400)
RBC: 3.9 10*6/uL (ref 3.70–5.32)
RDW: 13.5 % (ref 11.1–15.7)
WBC: 7.2 10*3/uL (ref 3.9–10.0)

## 2016-12-16 LAB — CMP (CANCER CENTER ONLY)
ALBUMIN: 3.6 g/dL (ref 3.3–5.5)
ALT(SGPT): 23 U/L (ref 10–47)
AST: 22 U/L (ref 11–38)
Alkaline Phosphatase: 119 U/L — ABNORMAL HIGH (ref 26–84)
BUN, Bld: 15 mg/dL (ref 7–22)
CALCIUM: 9.1 mg/dL (ref 8.0–10.3)
CHLORIDE: 102 meq/L (ref 98–108)
CO2: 27 mEq/L (ref 18–33)
Creat: 1.2 mg/dl (ref 0.6–1.2)
Glucose, Bld: 113 mg/dL (ref 73–118)
POTASSIUM: 3.4 meq/L (ref 3.3–4.7)
Sodium: 142 mEq/L (ref 128–145)
TOTAL PROTEIN: 8.4 g/dL — AB (ref 6.4–8.1)
Total Bilirubin: 0.8 mg/dl (ref 0.20–1.60)

## 2016-12-16 LAB — IRON AND TIBC
%SAT: 11 % — ABNORMAL LOW (ref 21–57)
Iron: 35 ug/dL — ABNORMAL LOW (ref 41–142)
TIBC: 311 ug/dL (ref 236–444)
UIBC: 277 ug/dL (ref 120–384)

## 2016-12-16 LAB — FERRITIN: FERRITIN: 74 ng/mL (ref 9–269)

## 2016-12-16 NOTE — Progress Notes (Signed)
Hematology and Oncology Follow Up Visit  Donna Dyer [id number] [birth year] 56 y.o. 12/16/2016   Principle Diagnosis:  IgG kappa monoclonal gammopathy of undetermined significance. 2. Anemia - possible iron deficiency.  Current Therapy:    Observation     Interim History:  [name] is back for followup. She sees be doing okay.  She does have some problems with some lower abdominal pain.  This is suprapubic.  There is no dysuria, hematuria or urinary frequency.  She has had no change in bowel habits.  She has no constipation.  She has had no nausea or vomiting.  She is going to see her family doctor.  It sounds like she is going to have a sonogram done.  When we last saw her, her monoclonal protein studies showed an M spike of 1.4 g/dL.  Her IgG level was 2221 mg/dL.  Her kappa light chain was 9.5 mg/dL.  She has had no fever.  She had no leg swelling.  She has had no rashes.  Her last iron studies back in June showed a ferritin of 121 with iron saturation of 17%.  Of note, her last iron infusion was back in December 2016  Overall, her performance status is ECOG 0.  Medications:  Current Outpatient Medications:  .  Acetaminophen (TYLENOL EXTRA STRENGTH PO), Take 2 tablets by mouth as needed., Disp: , Rfl:  .  AMBULATORY NON FORMULARY MEDICATION, One touch ultra strips and lancets for the One Touch Mini. Check fasting blood sugar in the morning and random blood sugar in the evening. Diagnosis Diabetes Type 2, ICD-10 code E11.9, Disp: 3 each, Rfl: 3 .  Ascorbic Acid (VITAMIN C) 100 MG tablet, Take 100 mg by mouth daily., Disp: , Rfl:  .  Biotin w/ Vitamins C & E (HAIR/SKIN/NAILS PO), Take by mouth., Disp: , Rfl:  .  Calcium-Vitamin D-Vitamin K (VIACTIV PO), Take by mouth., Disp: , Rfl:  .  Cholecalciferol (VITAMIN D) 2000 UNITS CAPS, Take 2,000 Units by mouth daily. , Disp: , Rfl:  .  fexofenadine (ALLEGRA) 180 MG tablet, Take 1 tablet (180 mg total) by mouth daily., Disp: 90  tablet, Rfl: 1 .  fluticasone (FLONASE) 50 MCG/ACT nasal spray, Place into both nostrils daily., Disp: , Rfl:  .  hydrochlorothiazide (HYDRODIURIL) 25 MG tablet, Take 1 tablet (25 mg total) by mouth daily., Disp: 90 tablet, Rfl: 3 .  losartan (COZAAR) 50 MG tablet, Take 1 tablet (50 mg total) by mouth daily., Disp: 90 tablet, Rfl: 3 .  Multiple Vitamin (MULTIVITAMIN) tablet, Take 1 tablet by mouth daily.  , Disp: , Rfl:  .  nebivolol (BYSTOLIC) 10 MG tablet, Take 2 tablets (20 mg total) by mouth daily., Disp: 180 tablet, Rfl: 1 .  ONE TOUCH ULTRA TEST test strip, , Disp: , Rfl:  .  verapamil (CALAN-SR) 180 MG CR tablet, Take 180 mg by mouth at bedtime., Disp: , Rfl:   Allergies:  Allergies  Allergen Reactions  . Sulfamethoxazole Rash  . Pineapple Itching and Swelling    Tongue swells  . Avapro [Irbesartan] Other (See Comments)  . Diovan [Valsartan] Other (See Comments)    Leg cramps   . Onglyza [Saxagliptin] Itching  . Spironolactone Other (See Comments)    Dizzy and nauseated.  Dizzy and nauseated.   Danelle Berry [Dulaglutide] Itching    ? Hypersensitivity reaction  . Amlodipine Besylate Other (See Comments)    muscle cramps  . Asparagus Itching    Of the mouth  .  Azithromycin Itching and Rash  . Chlorthalidone Other (See Comments)    Muscle cramps  . Codeine Nausea And Vomiting  . Hctz [Hydrochlorothiazide] Other (See Comments)    Muscle spasms    . Metformin Diarrhea    Diarrhea when started on metformin IR 100mg  daily Diarrhea when started on metformin IR 100mg  daily Diarrhea when started on metformin IR 100mg  daily  . Metformin And Related Diarrhea  . Sulfonamide Derivatives Rash    Past Medical History, Surgical history, Social history, and Family History were reviewed and updated.  Review of Systems: As stated in the interim history  Physical Exam:  vitals were not taken for this visit.   Physical Exam  Constitutional: She is oriented to person, place, and  time.  HENT:  Head: Normocephalic and atraumatic.  Mouth/Throat: Oropharynx is clear and moist.  Eyes: EOM are normal. Pupils are equal, round, and reactive to light.  Neck: Normal range of motion.  Cardiovascular: Normal rate, regular rhythm and normal heart sounds.  Pulmonary/Chest: Effort normal and breath sounds normal.  Abdominal: Soft. Bowel sounds are normal.  Musculoskeletal: Normal range of motion. She exhibits no edema, tenderness or deformity.  Lymphadenopathy:    She has no cervical adenopathy.  Neurological: She is alert and oriented to person, place, and time.  Skin: Skin is warm and dry. No rash noted. No erythema.  Psychiatric: She has a normal mood and affect. Her behavior is normal. Judgment and thought content normal.  Vitals reviewed.    Lab Results  Component Value Date   WBC 7.2 12/16/2016   HGB 10.8 (L) 12/16/2016   HCT 33.1 (L) 12/16/2016   MCV 85 12/16/2016   PLT 300 12/16/2016     Chemistry      Component Value Date/Time   NA 139 06/24/2016 1104   NA 136 06/10/2016 0747   K 4.2 06/24/2016 1104   K 4.0 06/10/2016 0747   CL 99 06/24/2016 1104   CL 101 04/26/2013 0805   CO2 26 06/24/2016 1104   CO2 26 06/10/2016 0747   BUN 23 06/24/2016 1104   BUN 22.4 06/10/2016 0747   CREATININE 1.15 (H) 06/24/2016 1104   CREATININE 1.2 (H) 06/10/2016 0747   GLU 153 04/15/2014      Component Value Date/Time   CALCIUM 9.5 06/24/2016 1104   CALCIUM 9.6 06/10/2016 0747   ALKPHOS 122 06/10/2016 0747   AST 20 06/10/2016 0747   ALT 17 06/10/2016 0747   BILITOT 0.61 06/10/2016 0747       Impression and Plan: Donna Dyer is 56 year old African American female. She has an IgG kappa monoclonal spike. I think this is an MGUS.  I notice that her hemoglobin is slowly trending downward.  We will see what her iron studies look like.  It is possible that we may have to consider some IV iron.  Her monoclonal studies also will be important.  I would like to see  her back in about 4 months.  I am not sure exactly what all is going on.  I think we have to have a little bit closer follow-up with her.  [name], MD 12/7/20188:16 AM

## 2016-12-17 LAB — IGG, IGA, IGM
IgA, Qn, Serum: 260 mg/dL (ref 87–352)
IgG, Qn, Serum: 2225 mg/dL — ABNORMAL HIGH (ref 700–1600)
IgM, Qn, Serum: 76 mg/dL (ref 26–217)

## 2016-12-17 LAB — BETA 2 MICROGLOBULIN, SERUM: Beta-2: 2.2 mg/L (ref 0.6–2.4)

## 2016-12-19 LAB — KAPPA/LAMBDA LIGHT CHAINS
Ig Kappa Free Light Chain: 79.2 mg/L — ABNORMAL HIGH (ref 3.3–19.4)
Ig Lambda Free Light Chain: 12 mg/L (ref 5.7–26.3)
KAPPA/LAMBDA FLC RATIO: 6.6 — AB (ref 0.26–1.65)

## 2016-12-21 ENCOUNTER — Telehealth: Payer: Self-pay | Admitting: *Deleted

## 2016-12-21 NOTE — Telephone Encounter (Addendum)
Message will be sent via My Chart since I am unable to reach patient and multiple voice mails left  ----- Message from [name], MD sent at 12/16/2016  3:53 PM EST ----- Call - Iron is very low!!  plese set up 1 dose of Feraheme next 1-2 weeks!!   Donna Dyer

## 2016-12-22 ENCOUNTER — Encounter: Payer: Self-pay | Admitting: *Deleted

## 2016-12-23 LAB — PROTEIN ELECTROPHORESIS, SERUM, WITH REFLEX
A/G Ratio: 0.8 (ref 0.7–1.7)
ALPHA 1: 0.2 g/dL (ref 0.0–0.4)
ALPHA 2: 0.9 g/dL (ref 0.4–1.0)
Albumin: 3.3 g/dL (ref 2.9–4.4)
Beta: 1.1 g/dL (ref 0.7–1.3)
GAMMA GLOBULIN: 2.1 g/dL — AB (ref 0.4–1.8)
GLOBULIN, TOTAL: 4.3 g/dL — AB (ref 2.2–3.9)
Interpretation(See Below): 0
M-SPIKE, %: 1.5 g/dL — AB
Total Protein: 7.6 g/dL (ref 6.0–8.5)

## 2016-12-28 DIAGNOSIS — H00024 Hordeolum internum left upper eyelid: Secondary | ICD-10-CM | POA: Diagnosis not present

## 2016-12-29 NOTE — Progress Notes (Signed)
Office Visit Note  Patient: Donna Dyer             Date of Birth: [birth year]           MRN: [id number]             PCP: Hali Marry, MD Referring: Hali Marry, * Visit Date: 01/12/2017 Occupation: @GUAROCC @    Subjective:  Pain in knees and feet   History of Present Illness: Donna Dyer is a 56 y.o. female with history of osteoarthritis and fibromyalgia syndrome. She states she continues to have pain and discomfort in her bilateral knee joints and bilateral feet. She's been having some discomfort in her right hip. Her neck and lower back is doing quite well currently. Patient reports that she had flu last week and she is recovered from that. She took Tamiflu.  Activities of Daily Living:  Patient reports morning stiffness for 0 minute.   Patient Reports nocturnal pain.  Difficulty dressing/grooming: Denies Difficulty climbing stairs: Denies Difficulty getting out of chair: Denies Difficulty using hands for taps, buttons, cutlery, and/or writing: Denies   Review of Systems  Constitutional: Negative for fatigue, night sweats, weight gain, weight loss and weakness.  HENT: Negative for mouth sores, trouble swallowing, trouble swallowing, mouth dryness and nose dryness.   Eyes: Negative for pain, redness, visual disturbance and dryness.  Respiratory: Negative for cough, shortness of breath and difficulty breathing.   Cardiovascular: Negative for chest pain, palpitations, hypertension, irregular heartbeat and swelling in legs/feet.  Gastrointestinal: Negative for blood in stool, constipation and diarrhea.  Endocrine: Negative for increased urination.  Genitourinary: Negative for vaginal dryness.  Musculoskeletal: Positive for arthralgias, joint pain and morning stiffness. Negative for joint swelling, myalgias, muscle weakness, muscle tenderness and myalgias.  Skin: Negative for color change, rash, hair loss, skin tightness, ulcers and sensitivity to  sunlight.  Allergic/Immunologic: Negative for susceptible to infections.  Neurological: Negative for dizziness, memory loss and night sweats.  Hematological: Negative for swollen glands.  Psychiatric/Behavioral: Negative for depressed mood and sleep disturbance. The patient is not nervous/anxious.     PMFS History:  Patient Active Problem List   Diagnosis Date Noted  . DDD (degenerative disc disease), cervical 11/30/2015  . DDD (degenerative disc disease), lumbar 11/30/2015  . Osteoarthritis of both hands 11/30/2015  . Osteoarthritis of both knees 11/30/2015  . Osteoporosis 11/30/2015  . Anemia 11/30/2015  . Bilateral primary osteoarthritis of knee 11/26/2015  . Food intolerance 07/03/2015  . MGUS (monoclonal gammopathy of unknown significance) 06/05/2015  . Other iron deficiency anemias 06/05/2015  . Iron malabsorption 06/05/2015  . Bleeding hemorrhoids 06/05/2015  . Other seasonal allergic rhinitis 11/29/2013  . Venous stasis 11/29/2013  . Cramping of feet 09/10/2013  . Right L4-L5 Lumbosacral facet joint syndrome 02/16/2012  . Hypertriglyceridemia 09/26/2011  . Vaginal intraepithelial neoplasia III (VAIN III) 08/23/2011  . OSA (obstructive sleep apnea) 03/29/2011  . Multinodular goiter 02/28/2011  . Internal hemorrhoids 01/31/2011  . Diabetes mellitus type 2, controlled, without complications ([place]) 75/64/3329  . Essential hypertension, benign 02/23/2010  . Edema 09/23/2009    Past Medical History:  Diagnosis Date  . Anemia   . Asthma 11/30/2015  . Bleeding hemorrhoids 06/05/2015  . DDD (degenerative disc disease), cervical 11/30/2015  . DDD (degenerative disc disease), lumbar 11/30/2015  . Diabetes mellitus   . Edema    LLE- podiatry in [place]  . Endometriosis   . Fibromyalgia 11/30/2015  . Goiter   . Hemorrhoids   .  Hyperlipidemia   . Hypertension   . Iron malabsorption 06/05/2015  . Lumbosacral radiculopathy at S1 07/18/2014   Bilateral  . MGUS (monoclonal  gammopathy of unknown significance) 06/05/2015  . Osteoarthritis of both hands 11/30/2015  . Osteoarthritis of both knees 11/30/2015  . Osteopenia   . Osteoporosis 11/30/2015  . Other iron deficiency anemias 06/05/2015  . Pain, joint, hip, right    chronic- Dr Alvan Dame Dr Nelva Bush  . Prediabetes   . Pseudogout 11/30/2015  . Sleep apnea     Family History  Problem Relation Age of Onset  . Rheum arthritis Mother   . Hypertension Mother   . Cancer Father        bladder  . Heart attack Father        MI at age 75  . Diabetes Brother   . Diabetes Brother   . Diabetes Brother   . Diabetes Brother   . Sarcoidosis Brother   . Sarcoidosis Sister   . Diabetes Sister   . Polycystic ovary syndrome Daughter   . Hypertension Daughter   . Anesthesia problems Neg Hx   . Hypotension Neg Hx   . Malignant hyperthermia Neg Hx   . Pseudochol deficiency Neg Hx    Past Surgical History:  Procedure Laterality Date  . Back Injections    . ESOPHAGOGASTRODUODENOSCOPY  7-11   for anemia  . FLEXIBLE SIGMOIDOSCOPY  01/31/2011   Procedure: FLEXIBLE SIGMOIDOSCOPY;  Surgeon: Missy Sabins, MD;  Location: [place];  Service: Endoscopy;  Laterality: N/A;  . TOTAL ABDOMINAL HYSTERECTOMY  1995   bilat oophorectomy/ endometriosis  . VULVAR LESION REMOVAL  09/07/2011   Procedure: VULVAR LESION;  Surgeon: Alvino Chapel, MD;  [facility]: [facility];  Service: Gynecology;  Laterality: N/A;  vaginal lesion   Social History   Social History Narrative   Lives in [place] with husband and daughter. Ambulatory, no cane or walker. Works at [employer].    [employer].   Right handed.   Caffeine none.     Objective: Vital Signs: BP 126/70 (BP Location: Left Arm, Patient Position: Sitting, Cuff Size: Normal)   Pulse 82   Resp 16   Ht 5\' 6"  (1.676 m)   Wt 227 lb (103 kg)   BMI 36.64 kg/m    Physical Exam  Constitutional: She is oriented to person, place, and time. She  appears well-developed and well-nourished.  HENT:  Head: Normocephalic and atraumatic.  Eyes: Conjunctivae and EOM are normal.  Neck: Normal range of motion.  Cardiovascular: Normal rate, regular rhythm, normal heart sounds and intact distal pulses.  Pulmonary/Chest: Effort normal and breath sounds normal.  Abdominal: Soft. Bowel sounds are normal.  Lymphadenopathy:    She has no cervical adenopathy.  Neurological: She is alert and oriented to person, place, and time.  Skin: Skin is warm and dry. Capillary refill takes less than 2 seconds.  Psychiatric: She has a normal mood and affect. Her behavior is normal.  Nursing note and vitals reviewed.    Musculoskeletal Exam: C-spine and thoracic lumbar spine good limited range of motion. She has no radiculopathy. She had tenderness on palpation over right facial bursa and some tightness in the right hamstring. Shoulder joints, elbow joints, wrist joints with good range of motion. She has some DIP PIP thickening in her hands consistent with osteoarthritis. Hip joints are good range of motion. She has some crepitus in her knee joints without any warmth swelling or effusion. She has  some osteoarthritic changes in her feet with DIP PIP thickening without any synovitis.  CDAI Exam: No CDAI exam completed.    Investigation: No additional findings. CBC Latest Ref Rng & Units 12/16/2016 06/10/2016 12/11/2015  WBC 3.9 - 10.0 10e3/uL 7.2 7.1 7.0  Hemoglobin 11.6 - 15.9 g/dL 10.8(L) 11.6 12.3  Hematocrit 34.8 - 46.6 % 33.1(L) 34.3(L) 36.9  Platelets 145 - 400 10e3/uL 300 295 287   CMP Latest Ref Rng & Units 12/16/2016 12/16/2016 06/24/2016  Glucose 73 - 118 mg/dL 113 - 171(H)  BUN 7 - 22 mg/dL 15 - 23  Creatinine 0.6 - 1.2 mg/dl 1.2 - 1.15(H)  Sodium 128 - 145 mEq/L 142 - 139  Potassium 3.3 - 4.7 mEq/L 3.4 - 4.2  Chloride 98 - 108 mEq/L 102 - 99  CO2 18 - 33 mEq/L 27 - 26  Calcium 8.0 - 10.3 mg/dL 9.1 - 9.5  Total Protein 6.0 - 8.5 g/dL 7.6 8.4(H) -    Total Bilirubin 0.20 - 1.60 mg/dl 0.80 - -  Alkaline Phos 26 - 84 U/L 119(H) - -  AST 11 - 38 U/L 22 - -  ALT 10 - 47 U/L 23 - -   Imaging: No results found.  Speciality Comments: No specialty comments available.    Procedures:  No procedures performed Allergies: Sulfamethoxazole; Pineapple; Sulfasalazine; Avapro [irbesartan]; Diovan [valsartan]; Onglyza [saxagliptin]; Spironolactone; Trulicity [dulaglutide]; Amlodipine besylate; Asparagus; Azithromycin; Chlorthalidone; Codeine; Hctz [hydrochlorothiazide]; Metformin; Metformin and related; Other; and Sulfonamide derivatives   Assessment / Plan:     Visit Diagnoses: Ischial bursitis of right side: She has pain and tenderness in the right shoulder bursa consistent with his shoulder bursitis. I offered physical therapy which she declined. His stretching exercises were discussed. If she has persistent symptoms she will notify me.  Primary osteoarthritis of both knees: She continues to have some discomfort in her knee joints with muscle spasms. Weight loss diet and exercise was discussed.  Primary osteoarthritis of both hands: Joint protection and muscle strengthening discussed.  Pain in both feet: she's been experiencing some pain in her bilateral feet. I offered x-rays of bilateral feet but she declined. Proper fitting shoes were discussed. Use of natural anti-inflammatories was discussed.  DDD (degenerative disc disease), cervical: Doing better  DDD (degenerative disc disease), lumbar: Doing better  MGUS (monoclonal gammopathy of unknown significance) - Followed up by Dr. Marin Olp  Bleeding hemorrhoids: Patient reports intermittent bleeding  History of obesity: Weight loss diet and exercise was discussed.  Other medical problems are listed as follows:  History of anemia  History of hypertension  History of sleep apnea  History of diabetes mellitus  Multinodular goiter  Hypertriglyceridemia  Vaginal intraepithelial  neoplasia III (VAIN III)    Orders: No orders of the defined types were placed in this encounter.  No orders of the defined types were placed in this encounter.   Face-to-face time spent with patient was 30 minutes. Greater than 50% of time was spent in counseling and coordination of care.  Follow-Up Instructions: Return in about 1 year (around 01/12/2018) for Osteoarthritis,DDD.   Bo Merino, MD  Note - This record has been created using Editor, commissioning.  Chart creation errors have been sought, but may not always  have been located. Such creation errors do not reflect on  the standard of medical care.

## 2017-01-12 ENCOUNTER — Encounter: Payer: Self-pay | Admitting: Rheumatology

## 2017-01-12 ENCOUNTER — Ambulatory Visit: Payer: Commercial Managed Care - PPO | Admitting: Rheumatology

## 2017-01-12 VITALS — BP 126/70 | HR 82 | Resp 16 | Ht 66.0 in | Wt 227.0 lb

## 2017-01-12 DIAGNOSIS — M19041 Primary osteoarthritis, right hand: Secondary | ICD-10-CM | POA: Diagnosis not present

## 2017-01-12 DIAGNOSIS — M19042 Primary osteoarthritis, left hand: Secondary | ICD-10-CM

## 2017-01-12 DIAGNOSIS — E042 Nontoxic multinodular goiter: Secondary | ICD-10-CM

## 2017-01-12 DIAGNOSIS — D472 Monoclonal gammopathy: Secondary | ICD-10-CM

## 2017-01-12 DIAGNOSIS — M79671 Pain in right foot: Secondary | ICD-10-CM | POA: Diagnosis not present

## 2017-01-12 DIAGNOSIS — D072 Carcinoma in situ of vagina: Secondary | ICD-10-CM

## 2017-01-12 DIAGNOSIS — Z8669 Personal history of other diseases of the nervous system and sense organs: Secondary | ICD-10-CM

## 2017-01-12 DIAGNOSIS — K649 Unspecified hemorrhoids: Secondary | ICD-10-CM | POA: Diagnosis not present

## 2017-01-12 DIAGNOSIS — M7071 Other bursitis of hip, right hip: Secondary | ICD-10-CM

## 2017-01-12 DIAGNOSIS — M5030 Other cervical disc degeneration, unspecified cervical region: Secondary | ICD-10-CM

## 2017-01-12 DIAGNOSIS — M79672 Pain in left foot: Secondary | ICD-10-CM

## 2017-01-12 DIAGNOSIS — M170 Bilateral primary osteoarthritis of knee: Secondary | ICD-10-CM

## 2017-01-12 DIAGNOSIS — E781 Pure hyperglyceridemia: Secondary | ICD-10-CM

## 2017-01-12 DIAGNOSIS — Z8639 Personal history of other endocrine, nutritional and metabolic disease: Secondary | ICD-10-CM | POA: Diagnosis not present

## 2017-01-12 DIAGNOSIS — M5136 Other intervertebral disc degeneration, lumbar region: Secondary | ICD-10-CM

## 2017-01-12 DIAGNOSIS — Z862 Personal history of diseases of the blood and blood-forming organs and certain disorders involving the immune mechanism: Secondary | ICD-10-CM

## 2017-01-12 DIAGNOSIS — Z8679 Personal history of other diseases of the circulatory system: Secondary | ICD-10-CM

## 2017-01-12 NOTE — Patient Instructions (Signed)
Natural anti-inflammatories  You can purchase these at [employer], [employer] or online.  . Turmeric (capsules)  . Ginger (ginger root or capsules)  . Omega 3 (Fish, flax seeds, chia seeds, walnuts, almonds)  . Tart cherry (dried or extract)   Patient should be under the care of a physician while taking these supplements. This may not be reproduced without the permission of Dr. Amelianna Meller.  

## 2017-01-17 ENCOUNTER — Encounter: Payer: Self-pay | Admitting: *Deleted

## 2017-01-24 DIAGNOSIS — I10 Essential (primary) hypertension: Secondary | ICD-10-CM | POA: Diagnosis not present

## 2017-01-24 DIAGNOSIS — R109 Unspecified abdominal pain: Secondary | ICD-10-CM | POA: Diagnosis not present

## 2017-01-27 DIAGNOSIS — H40013 Open angle with borderline findings, low risk, bilateral: Secondary | ICD-10-CM | POA: Diagnosis not present

## 2017-01-27 DIAGNOSIS — H00024 Hordeolum internum left upper eyelid: Secondary | ICD-10-CM | POA: Diagnosis not present

## 2017-01-27 DIAGNOSIS — H04123 Dry eye syndrome of bilateral lacrimal glands: Secondary | ICD-10-CM | POA: Diagnosis not present

## 2017-01-27 DIAGNOSIS — H2513 Age-related nuclear cataract, bilateral: Secondary | ICD-10-CM | POA: Diagnosis not present

## 2017-02-01 DIAGNOSIS — Z01419 Encounter for gynecological examination (general) (routine) without abnormal findings: Secondary | ICD-10-CM | POA: Diagnosis not present

## 2017-02-01 DIAGNOSIS — Z6836 Body mass index (BMI) 36.0-36.9, adult: Secondary | ICD-10-CM | POA: Diagnosis not present

## 2017-02-10 DIAGNOSIS — H00024 Hordeolum internum left upper eyelid: Secondary | ICD-10-CM | POA: Diagnosis not present

## 2017-02-10 DIAGNOSIS — H40013 Open angle with borderline findings, low risk, bilateral: Secondary | ICD-10-CM | POA: Diagnosis not present

## 2017-02-10 DIAGNOSIS — H2513 Age-related nuclear cataract, bilateral: Secondary | ICD-10-CM | POA: Diagnosis not present

## 2017-02-10 DIAGNOSIS — H04123 Dry eye syndrome of bilateral lacrimal glands: Secondary | ICD-10-CM | POA: Diagnosis not present

## 2017-02-23 DIAGNOSIS — H40013 Open angle with borderline findings, low risk, bilateral: Secondary | ICD-10-CM | POA: Diagnosis not present

## 2017-02-23 DIAGNOSIS — H00024 Hordeolum internum left upper eyelid: Secondary | ICD-10-CM | POA: Diagnosis not present

## 2017-02-23 DIAGNOSIS — H2513 Age-related nuclear cataract, bilateral: Secondary | ICD-10-CM | POA: Diagnosis not present

## 2017-02-23 DIAGNOSIS — H04123 Dry eye syndrome of bilateral lacrimal glands: Secondary | ICD-10-CM | POA: Diagnosis not present

## 2017-02-24 DIAGNOSIS — I10 Essential (primary) hypertension: Secondary | ICD-10-CM | POA: Diagnosis not present

## 2017-02-24 DIAGNOSIS — E042 Nontoxic multinodular goiter: Secondary | ICD-10-CM | POA: Diagnosis not present

## 2017-02-24 DIAGNOSIS — E1169 Type 2 diabetes mellitus with other specified complication: Secondary | ICD-10-CM | POA: Diagnosis not present

## 2017-02-24 DIAGNOSIS — E119 Type 2 diabetes mellitus without complications: Secondary | ICD-10-CM | POA: Diagnosis not present

## 2017-03-13 DIAGNOSIS — K921 Melena: Secondary | ICD-10-CM | POA: Diagnosis not present

## 2017-03-13 DIAGNOSIS — D649 Anemia, unspecified: Secondary | ICD-10-CM | POA: Diagnosis not present

## 2017-03-14 ENCOUNTER — Encounter: Payer: Self-pay | Admitting: Physician Assistant

## 2017-03-14 ENCOUNTER — Ambulatory Visit (INDEPENDENT_AMBULATORY_CARE_PROVIDER_SITE_OTHER): Payer: BLUE CROSS/BLUE SHIELD | Admitting: Physician Assistant

## 2017-03-14 VITALS — BP 150/84 | HR 74 | Ht 66.0 in | Wt 227.2 lb

## 2017-03-14 DIAGNOSIS — I10 Essential (primary) hypertension: Secondary | ICD-10-CM | POA: Diagnosis not present

## 2017-03-14 DIAGNOSIS — E119 Type 2 diabetes mellitus without complications: Secondary | ICD-10-CM

## 2017-03-14 DIAGNOSIS — E785 Hyperlipidemia, unspecified: Secondary | ICD-10-CM

## 2017-03-14 MED ORDER — VERAPAMIL HCL ER 240 MG PO TBCR: 240.0000 mg | EXTENDED_RELEASE_TABLET | Freq: Every day | ORAL | 1 refills | Status: DC

## 2017-03-14 NOTE — Progress Notes (Signed)
Cardiology Office Note    Date:  03/14/2017   ID:  Donna Dyer, DOB 1960/12/06, MRN [id number]  PCP:  Hali Marry, MD  Cardiologist:  Dr. Stanford Breed   Chief Complaint  Patient presents with  . Follow-up    seen for Dr. Stanford Breed.     History of Present Illness:  Donna Dyer is a 56 y.o. female with PMH of DM II, fibromyalgia, HTN, HLD, MGUS, and OSA.  Patient mainly follow cardiology for hypertension.  Echocardiogram in October 2012 showed normal LV function, grade 1 DD, mild LAE.  She has been intolerant of ARB, HCTZ, spironolactone, chlorthalidone and Norvasc. She was last seen by Dr. Stanford Breed on 08/10/2016.  She was doing well at the time.  He is being followed by [facility] for multinodular goiter, previous biopsy was negative for malignancy.  He was last seen by Dr. Dwyane Dee of family medicine service at [facility] in January, her blood pressure was elevated at the time.    Patient presents today for cardiology office visit.  Her blood pressure has been running high for the past year according to the patient.  Even though, based on office visit blood pressure, it appears her blood pressure is 126/70 in January 2019, however based on the home blood pressure diary brought in by the patient, her systolic blood pressure has been running in the 140s-150s range.  She says she occasionally has a headache associated with this, however denies any chest pain, shortness of breath, orthopnea or PND.  She has very mild lower extremity edema which is worse with long periods of standing.  I will increase the verapamil to 240 mg daily.  She will return in 2 months for reassessment.  If blood pressure still uncontrolled, may consider either hydralazine or clonidine.  She apparently had clonidine in the past, she says the clonidine was discontinued after she developed a rash under the patch.  Based on her description, I think the rash was more of a  reaction to the adhesive in the patch itself instead of clonidine.   Past Medical History:  Diagnosis Date  . Anemia   . Asthma 11/30/2015  . Bleeding hemorrhoids 06/05/2015  . DDD (degenerative disc disease), cervical 11/30/2015  . DDD (degenerative disc disease), lumbar 11/30/2015  . Diabetes mellitus   . Edema    LLE- podiatry in [place]  . Endometriosis   . Fibromyalgia 11/30/2015  . Goiter   . Hemorrhoids   . Hyperlipidemia   . Hypertension   . Iron malabsorption 06/05/2015  . Lumbosacral radiculopathy at S1 07/18/2014   Bilateral  . MGUS (monoclonal gammopathy of unknown significance) 06/05/2015  . Osteoarthritis of both hands 11/30/2015  . Osteoarthritis of both knees 11/30/2015  . Osteopenia   . Osteoporosis 11/30/2015  . Other iron deficiency anemias 06/05/2015  . Pain, joint, hip, right    chronic- Dr Alvan Dame Dr Nelva Bush  . Prediabetes   . Pseudogout 11/30/2015  . Sleep apnea     Past Surgical History:  Procedure Laterality Date  . Back Injections    . ESOPHAGOGASTRODUODENOSCOPY  7-11   for anemia  . FLEXIBLE SIGMOIDOSCOPY  01/31/2011   Procedure: FLEXIBLE SIGMOIDOSCOPY;  Surgeon: Missy Sabins, MD;  Location: [place];  Service: Endoscopy;  Laterality: N/A;  . TOTAL ABDOMINAL HYSTERECTOMY  1995   bilat oophorectomy/ endometriosis  . VULVAR LESION REMOVAL  09/07/2011   Procedure: VULVAR LESION;  Surgeon: Alvino Chapel, MD;  Location: [place];  Service: Gynecology;  Laterality: N/A;  vaginal lesion    Current Medications: Outpatient Medications Prior to Visit  Medication Sig Dispense Refill  . Acetaminophen (TYLENOL EXTRA STRENGTH PO) Take 2 tablets by mouth as needed.    . AMBULATORY NON FORMULARY MEDICATION One touch ultra strips and lancets for the One Touch Mini. Check fasting blood sugar in the morning and random blood sugar in the evening. Diagnosis Diabetes Type 2, ICD-10 code E11.9 3 each 3  . Ascorbic Acid (VITAMIN C) 100 MG tablet  Take 100 mg by mouth daily.    Marland Kitchen BIOTIN PO Take by mouth.    . Biotin w/ Vitamins C & E (HAIR/SKIN/NAILS PO) Take by mouth.    . Calcium Carb-Cholecalciferol (CALCIUM CARBONATE-VITAMIN D3 PO) Take by mouth.    . Calcium-Vitamin D-Vitamin K (VIACTIV PO) Take by mouth.    . Cholecalciferol (VITAMIN D3) 2000 units capsule Take by mouth.    . fexofenadine (ALLEGRA) 180 MG tablet Take 1 tablet (180 mg total) by mouth daily. 90 tablet 1  . fluticasone (FLONASE) 50 MCG/ACT nasal spray Place into both nostrils daily.    Marland Kitchen losartan (COZAAR) 50 MG tablet Take 100 mg by mouth.    . Multiple Vitamin (MULTIVITAMIN) tablet Take 1 tablet by mouth daily.      . nebivolol (BYSTOLIC) 10 MG tablet Take 2 tablets (20 mg total) by mouth daily. 180 tablet 1  . ONE TOUCH ULTRA TEST test strip     . verapamil (CALAN-SR) 180 MG CR tablet Take 180 mg by mouth at bedtime.     No facility-administered medications prior to visit.      Allergies:   Sulfamethoxazole; Pineapple; Sulfasalazine; Avapro [irbesartan]; Diovan [valsartan]; Onglyza [saxagliptin]; Spironolactone; Trulicity [dulaglutide]; Amlodipine besylate; Asparagus; Azithromycin; Chlorthalidone; Codeine; Hctz [hydrochlorothiazide]; Metformin; Metformin and related; Other; Sulfa antibiotics; and Sulfonamide derivatives   Social History   Socioeconomic History  . Marital status: Married    Spouse name: None  . Number of children: 2  . Years of education: None  . Highest education level: None  Social Needs  . Financial resource strain: None  . Food insecurity - worry: None  . Food insecurity - inability: None  . Transportation needs - medical: None  . Transportation needs - non-medical: None  Occupational History  . 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 Smoking status: Never Smoker  . Smokeless tobacco: Never Used  Substance and Sexual Activity  . Alcohol use: No    Alcohol/week: 0.0 oz    Comment:  Doesn't drink   . Drug use: No  . Sexual activity: Yes    Birth control/protection: Surgical  Other Topics Concern  . None  Social History Narrative   Lives in [place] with husband and daughter. Ambulatory, no cane or walker. Works at [employer].    [employer].   Right handed.   Caffeine none.     Family History:  The patient's family history includes Cancer in her father; Diabetes in her brother, brother, brother, brother, and sister; Heart attack in her father; Hypertension in her daughter and mother; Polycystic ovary syndrome in her daughter; Rheum arthritis in her mother; Sarcoidosis in her brother and sister.   ROS:   Please see the history of present illness.    ROS All other systems reviewed and are negative.   PHYSICAL EXAM:   VS:  BP (!) 150/84  Pulse 74   Ht 5\' 6"  (1.676 m)   Wt 227 lb 3.2 oz (103.1 kg)   BMI 36.67 kg/m    GEN: Well nourished, well developed, in no acute distress  HEENT: normal  Neck: no JVD, carotid bruits, or masses Cardiac: RRR; no murmurs, rubs, or gallops,no edema  Respiratory:  clear to auscultation bilaterally, normal work of breathing GI: soft, nontender, nondistended, + BS MS: no deformity or atrophy  Skin: warm and dry, no rash Neuro:  Alert and Oriented x 3, Strength and sensation are intact Psych: euthymic mood, full affect  Wt Readings from Last 3 Encounters:  03/14/17 227 lb 3.2 oz (103.1 kg)  01/12/17 227 lb (103 kg)  12/16/16 224 lb (101.6 kg)      Studies/Labs Reviewed:   EKG:  EKG is not ordered today.    Recent Labs: 12/16/2016: ALT(SGPT) 23; BUN, Bld 15; Creat 1.2; HGB 10.8; Platelets 300; Potassium 3.4; Sodium 142   Lipid Panel    Component Value Date/Time   CHOL 145 01/16/2015   TRIG 191 (A) 01/16/2015   HDL 52 01/16/2015   CHOLHDL 4.7 03/15/2013 0847   VLDL 43 (H) 03/15/2013 0847   LDLCALC 55 01/16/2015    Additional studies/ records that were reviewed today include:   Echo  10/22/2010 LV EF: 55% -  60%  Study Conclusions  - Left ventricle: The cavity size was normal. Wall thickness was increased in a pattern of mild LVH. Systolic function was normal. The estimated ejection fraction was in the range of 55% to 60%. Wall motion was normal; there were no regional wall motion abnormalities. Doppler parameters are consistent with abnormal left ventricular relaxation (grade 1 diastolic dysfunction). - Left atrium: The atrium was mildly dilated. - Atrial septum: No defect or patent foramen ovale was identified.   ASSESSMENT:    1. Essential hypertension   2. Hyperlipidemia, unspecified hyperlipidemia type   3. Controlled type 2 diabetes mellitus without complication, without long-term current use of insulin (HCC)      PLAN:  In order of problems listed above:  1. Hypertension: Blood pressure has been consistently ranging in the 130-140s range.  We will increase verapamil to 240 mg daily.  May also consider addition of either clonidine or hydralazine in the future.  She has allergies to avoid multiple blood pressure medication in the past.  TSH was normal in November 2018.  2. Hypertriglyceridemia: Last lipid panel was 2 years ago, will need repeat lipid panel.  Her LDL HDL and total cholesterol has always been well controlled.  Triglyceride has been high however based on the previous lab was improving from 417 five years ago to 191 two years ago.   3. DM 2: Managed by primary care provider.  Currently not on any diabetes medication.  Recent hemoglobin A1c was 6.5 on 02/24/2017    Medication Adjustments/Labs and Tests Ordered: Current medicines are reviewed at length with the patient today.  Concerns regarding medicines are outlined above.  Medication changes, Labs and Tests ordered today are listed in the Patient Instructions below. Patient Instructions  Medication Instructions:  INCREASE VERAPAMIL TO 240 MG DAILY    Labwork: NONE  Testing/Procedures: NONE  Follow-Up: Your physician recommends that you schedule a follow-up appointment in: 2 [place]   If you need a refill on your cardiac medications before your next appointment, please call your pharmacy.     Hilbert Corrigan, [place]  03/14/2017 12:32 PM    [facility] [place], [place], [place]  [address] Phone: [phone]; Fax: [phone]

## 2017-03-14 NOTE — Patient Instructions (Signed)
Medication Instructions:  INCREASE VERAPAMIL TO 240 MG DAILY   Labwork: NONE  Testing/Procedures: NONE  Follow-Up: Your physician recommends that you schedule a follow-up appointment in: 2 [place]   If you need a refill on your cardiac medications before your next appointment, please call your pharmacy.

## 2017-03-23 DIAGNOSIS — E042 Nontoxic multinodular goiter: Secondary | ICD-10-CM | POA: Diagnosis not present

## 2017-03-23 DIAGNOSIS — E041 Nontoxic single thyroid nodule: Secondary | ICD-10-CM | POA: Diagnosis not present

## 2017-03-27 ENCOUNTER — Encounter: Payer: Self-pay | Admitting: Cardiology

## 2017-03-27 ENCOUNTER — Other Ambulatory Visit: Payer: Self-pay | Admitting: *Deleted

## 2017-03-27 DIAGNOSIS — E041 Nontoxic single thyroid nodule: Secondary | ICD-10-CM | POA: Diagnosis not present

## 2017-03-27 MED ORDER — NEBIVOLOL HCL 10 MG PO TABS: 20.0000 mg | ORAL_TABLET | Freq: Every day | ORAL | 3 refills | Status: DC

## 2017-03-30 DIAGNOSIS — D500 Iron deficiency anemia secondary to blood loss (chronic): Secondary | ICD-10-CM | POA: Diagnosis not present

## 2017-03-30 DIAGNOSIS — D123 Benign neoplasm of transverse colon: Secondary | ICD-10-CM | POA: Diagnosis not present

## 2017-03-30 DIAGNOSIS — K648 Other hemorrhoids: Secondary | ICD-10-CM | POA: Diagnosis not present

## 2017-04-14 ENCOUNTER — Other Ambulatory Visit: Payer: Self-pay

## 2017-04-14 ENCOUNTER — Inpatient Hospital Stay: Payer: BLUE CROSS/BLUE SHIELD | Attending: Hematology & Oncology | Admitting: Hematology & Oncology

## 2017-04-14 ENCOUNTER — Inpatient Hospital Stay: Payer: BLUE CROSS/BLUE SHIELD

## 2017-04-14 ENCOUNTER — Encounter: Payer: Self-pay | Admitting: Hematology & Oncology

## 2017-04-14 VITALS — BP 175/84 | HR 60 | Temp 98.3°F | Resp 18 | Wt 231.0 lb

## 2017-04-14 DIAGNOSIS — K909 Intestinal malabsorption, unspecified: Secondary | ICD-10-CM

## 2017-04-14 DIAGNOSIS — D472 Monoclonal gammopathy: Secondary | ICD-10-CM

## 2017-04-14 DIAGNOSIS — D649 Anemia, unspecified: Secondary | ICD-10-CM

## 2017-04-14 DIAGNOSIS — Z79899 Other long term (current) drug therapy: Secondary | ICD-10-CM

## 2017-04-14 DIAGNOSIS — K649 Unspecified hemorrhoids: Secondary | ICD-10-CM

## 2017-04-14 LAB — CBC WITH DIFFERENTIAL (CANCER CENTER ONLY)
BASOS ABS: 0 10*3/uL (ref 0.0–0.1)
Basophils Relative: 0 %
EOS ABS: 0.1 10*3/uL (ref 0.0–0.5)
EOS PCT: 1 %
HCT: 33.6 % — ABNORMAL LOW (ref 34.8–46.6)
Hemoglobin: 11.2 g/dL — ABNORMAL LOW (ref 11.6–15.9)
Lymphocytes Relative: 32 %
Lymphs Abs: 2.2 10*3/uL (ref 0.9–3.3)
MCH: 27.7 pg (ref 26.0–34.0)
MCHC: 33.3 g/dL (ref 32.0–36.0)
MCV: 83 fL (ref 81.0–101.0)
MONO ABS: 0.4 10*3/uL (ref 0.1–0.9)
Monocytes Relative: 6 %
Neutro Abs: 4.2 10*3/uL (ref 1.5–6.5)
Neutrophils Relative %: 61 %
PLATELETS: 263 10*3/uL (ref 145–400)
RBC: 4.05 MIL/uL (ref 3.70–5.32)
RDW: 14.2 % (ref 11.1–15.7)
WBC: 6.9 10*3/uL (ref 3.9–10.0)

## 2017-04-14 LAB — CMP (CANCER CENTER ONLY)
ALK PHOS: 127 U/L (ref 40–150)
ALT: 16 U/L (ref 0–55)
ANION GAP: 10 (ref 3–11)
AST: 17 U/L (ref 5–34)
Albumin: 3.6 g/dL (ref 3.5–5.0)
BILIRUBIN TOTAL: 0.5 mg/dL (ref 0.2–1.2)
BUN: 15 mg/dL (ref 7–26)
CALCIUM: 9.2 mg/dL (ref 8.4–10.4)
CO2: 24 mmol/L (ref 22–29)
Chloride: 104 mmol/L (ref 98–109)
Creatinine: 0.88 mg/dL (ref 0.60–1.10)
GFR, Estimated: >60 mL/min (ref >60)
Glucose, Bld: 109 mg/dL (ref 70–140)
Potassium: 4 mmol/L (ref 3.5–5.1)
Sodium: 138 mmol/L (ref 136–145)
TOTAL PROTEIN: 8.3 g/dL (ref 6.4–8.3)

## 2017-04-14 LAB — RETICULOCYTES
RBC.: 4.08 MIL/uL (ref 3.70–5.45)
Retic Count, Absolute: 49 10*3/uL (ref 33.7–90.7)
Retic Ct Pct: 1.2 % (ref 0.7–2.1)

## 2017-04-14 LAB — IRON AND TIBC
IRON: 53 ug/dL (ref 41–142)
Saturation Ratios: 17 % — ABNORMAL LOW (ref 21–57)
TIBC: 311 ug/dL (ref 236–444)
UIBC: 258 ug/dL

## 2017-04-14 LAB — FERRITIN: Ferritin: 68 ng/mL (ref 9–269)

## 2017-04-14 NOTE — Progress Notes (Signed)
Hematology and Oncology Follow Up Visit  Donna Dyer [id number] 05-22-1960 57 y.o. 04/14/2017   Principle Diagnosis:  IgG kappa monoclonal gammopathy of undetermined significance. 2. Anemia - possible iron deficiency.  Current Therapy:    Observation     Interim History:  Donna Dyer is back for followup.  we last saw her back in early December.  At that time, I think she did get a dose of IV iron.  At that time, her iron studies showed a ferritin of 74 with an iron saturation of 11%.  Today, her saturation is only 17%.  We probably will have to give her another dose of iron.  She did have a upper and lower endoscopy.  This was just done a couple weeks ago.  She is had a couple polyps were removed.  As far as her monoclonal studies go for the MGUS, this has been steady.  Her M spike back in December was 1.5 g/dL.  Her IgG level was 2225 mg/dL.  Her kappa light chain was 7.9 mg/dL.  Her husband almost died from rectal bleeding.  He apparently got constipated with pain medication that he took after hip surgery.  He apparently tore a artery back in the rectal area.  Thankfully, he was able to be saved.  Donna Dyer says that he received 6 units of blood.  She has had no fever.  She has had no nausea or vomiting.  There is been no obvious bleeding.  She is had no melena.  Overall, her performance status is ECOG 0.  Medications:  Current Outpatient Medications:  .  Acetaminophen (TYLENOL EXTRA STRENGTH PO), Take 2 tablets by mouth as needed., Disp: , Rfl:  .  AMBULATORY NON FORMULARY MEDICATION, One touch ultra strips and lancets for the One Touch Mini. Check fasting blood sugar in the morning and random blood sugar in the evening. Diagnosis Diabetes Type 2, ICD-10 code E11.9, Disp: 3 each, Rfl: 3 .  Ascorbic Acid (VITAMIN C) 100 MG tablet, Take 100 mg by mouth daily., Disp: , Rfl:  .  BIOTIN PO, Take by mouth., Disp: , Rfl:  .  Biotin w/ Vitamins C & E (HAIR/SKIN/NAILS PO), Take  by mouth., Disp: , Rfl:  .  Calcium Carb-Cholecalciferol (CALCIUM CARBONATE-VITAMIN D3 PO), Take by mouth., Disp: , Rfl:  .  Calcium-Vitamin D-Vitamin K (VIACTIV PO), Take by mouth., Disp: , Rfl:  .  Cholecalciferol (VITAMIN D3) 2000 units capsule, Take by mouth., Disp: , Rfl:  .  fexofenadine (ALLEGRA) 180 MG tablet, Take 1 tablet (180 mg total) by mouth daily., Disp: 90 tablet, Rfl: 1 .  fluticasone (FLONASE) 50 MCG/ACT nasal spray, Place into both nostrils daily., Disp: , Rfl:  .  losartan (COZAAR) 50 MG tablet, Take 100 mg by mouth., Disp: , Rfl:  .  Multiple Vitamin (MULTIVITAMIN) tablet, Take 1 tablet by mouth daily.  , Disp: , Rfl:  .  nebivolol (BYSTOLIC) 10 MG tablet, Take 2 tablets (20 mg total) by mouth daily., Disp: 180 tablet, Rfl: 3 .  ONE TOUCH ULTRA TEST test strip, , Disp: , Rfl:  .  verapamil (CALAN-SR) 240 MG CR tablet, Take 1 tablet (240 mg total) by mouth at bedtime., Disp: 90 tablet, Rfl: 1  Allergies:  Allergies  Allergen Reactions  . Sulfamethoxazole Rash  . Pineapple Itching and Swelling    Tongue swells  . Sulfasalazine Rash  . Avapro [Irbesartan] Other (See Comments)  . Diovan [Valsartan] Other (See Comments)  Leg cramps   . Onglyza [Saxagliptin] Itching  . Spironolactone Other (See Comments)    Dizzy and nauseated.  Dizzy and nauseated.   Danelle Berry [Dulaglutide] Itching    ? Hypersensitivity reaction  . Amlodipine Besylate Other (See Comments)    muscle cramps  . Asparagus Itching    Of the mouth  . Azithromycin Itching and Rash  . Chlorthalidone Other (See Comments)    Muscle cramps  . Codeine Nausea And Vomiting  . Hctz [Hydrochlorothiazide] Other (See Comments)    Muscle spasms    . Metformin Diarrhea    Diarrhea when started on metformin IR 100mg  daily Diarrhea when started on metformin IR 100mg  daily Diarrhea when started on metformin IR 100mg  daily  . Metformin And Related Diarrhea  . Other Itching    Never had IV dye  . Sulfa  Antibiotics Rash    Other reaction(s): RASH Other reaction(s): RASH   . Sulfonamide Derivatives Rash    Past Medical History, Surgical history, Social history, and Family History were reviewed and updated.  Review of Systems: Review of Systems  Constitutional: Negative.   HENT: Negative.   Eyes: Negative.   Respiratory: Negative.   Cardiovascular: Negative.   Gastrointestinal: Negative.   Genitourinary: Negative.   Musculoskeletal: Negative.   Skin: Negative.   Neurological: Negative.   Endo/Heme/Allergies: Negative.   Psychiatric/Behavioral: Negative.      Physical Exam:  weight is 231 lb (104.8 kg). Her oral temperature is 98.3 F (36.8 C). Her blood pressure is 175/84 (abnormal) and her pulse is 60. Her respiration is 18 and oxygen saturation is 100%.   Physical Exam  Constitutional: She is oriented to person, place, and time.  HENT:  Head: Normocephalic and atraumatic.  Mouth/Throat: Oropharynx is clear and moist.  Eyes: Pupils are equal, round, and reactive to light. EOM are normal.  Neck: Normal range of motion.  Cardiovascular: Normal rate, regular rhythm and normal heart sounds.  Pulmonary/Chest: Effort normal and breath sounds normal.  Abdominal: Soft. Bowel sounds are normal.  Musculoskeletal: Normal range of motion. She exhibits no edema, tenderness or deformity.  Lymphadenopathy:    She has no cervical adenopathy.  Neurological: She is alert and oriented to person, place, and time.  Skin: Skin is warm and dry. No rash noted. No erythema.  Psychiatric: She has a normal mood and affect. Her behavior is normal. Judgment and thought content normal.  Vitals reviewed.    Lab Results  Component Value Date   WBC 6.9 04/14/2017   HGB 10.8 (L) 12/16/2016   HCT 33.6 (L) 04/14/2017   MCV 83.0 04/14/2017   PLT 263 04/14/2017     Chemistry      Component Value Date/Time   NA 142 12/16/2016 0802   NA 136 06/10/2016 0747   K 3.4 12/16/2016 0802   K 4.0  06/10/2016 0747   CL 102 12/16/2016 0802   CO2 27 12/16/2016 0802   CO2 26 06/10/2016 0747   BUN 15 12/16/2016 0802   BUN 22.4 06/10/2016 0747   CREATININE 1.2 12/16/2016 0802   CREATININE 1.2 (H) 06/10/2016 0747   GLU 153 04/15/2014      Component Value Date/Time   CALCIUM 9.1 12/16/2016 0802   CALCIUM 9.6 06/10/2016 0747   ALKPHOS 119 (H) 12/16/2016 0802   ALKPHOS 122 06/10/2016 0747   AST 22 12/16/2016 0802   AST 20 06/10/2016 0747   ALT 23 12/16/2016 0802   ALT 17 06/10/2016 0747   BILITOT 0.80  12/16/2016 0802   BILITOT 0.61 06/10/2016 0747       Impression and Plan: Donna Dyer is 57 year old African American female. She has an IgG kappa monoclonal spike. I think this is an MGUS.  Again, we will have to give her a dose of iron.  Her iron saturation is on the low side.  Hopefully this will bump up her hemoglobin a little bit.  We will see what her monoclonal studies show.  We will plan to get her back in another 6 months.  I think that by giving her iron now, we will get her through the summertime without any problems with fatigue or weakness.  [name], MD 4/5/20199:22 AM

## 2017-04-15 LAB — IGG, IGA, IGM
IGG (IMMUNOGLOBIN G), SERUM: 2316 mg/dL — AB (ref 700–1600)
IgA: 272 mg/dL (ref 87–352)
IgM (Immunoglobulin M), Srm: 79 mg/dL (ref 26–217)

## 2017-04-16 LAB — ERYTHROPOIETIN: ERYTHROPOIETIN: 19 m[IU]/mL — AB (ref 2.6–18.5)

## 2017-04-17 ENCOUNTER — Telehealth: Payer: Self-pay | Admitting: *Deleted

## 2017-04-17 LAB — KAPPA/LAMBDA LIGHT CHAINS
Kappa free light chain: 84.6 mg/L — ABNORMAL HIGH (ref 3.3–19.4)
Kappa, lambda light chain ratio: 6.71 — ABNORMAL HIGH (ref 0.26–1.65)
Lambda free light chains: 12.6 mg/L (ref 5.7–26.3)

## 2017-04-17 NOTE — Telephone Encounter (Addendum)
-----   Message from [name], MD sent at 04/14/2017  5:23 PM EDT ----- Called the patient to let her know that  the iron level is low!!  Told patient that Dr. Marin Olp recommends 1 dose of IV iron in 1-2 week.  Patient concerned that the last time she got IV iron (3 years ago) that it didn't work.  She wants to hold on the IV iron at this point and recheck her Iron levels at a later date to see if they have gone up.

## 2017-04-20 LAB — PROTEIN ELECTROPHORESIS, SERUM, WITH REFLEX
A/G RATIO SPE: 0.8 (ref 0.7–1.7)
ALBUMIN ELP: 3.4 g/dL (ref 2.9–4.4)
Alpha-1-Globulin: 0.3 g/dL (ref 0.0–0.4)
Alpha-2-Globulin: 0.9 g/dL (ref 0.4–1.0)
BETA GLOBULIN: 1.3 g/dL (ref 0.7–1.3)
GLOBULIN, TOTAL: 4.4 g/dL — AB (ref 2.2–3.9)
Gamma Globulin: 1.9 g/dL — ABNORMAL HIGH (ref 0.4–1.8)
M-Spike, %: 1.4 g/dL — ABNORMAL HIGH
SPEP INTERP: 0
Total Protein ELP: 7.8 g/dL (ref 6.0–8.5)

## 2017-04-20 LAB — IMMUNOFIXATION REFLEX, SERUM
IGM (IMMUNOGLOBULIN M), SRM: 85 mg/dL (ref 26–217)
IgA: 258 mg/dL (ref 87–352)
IgG (Immunoglobin G), Serum: 2322 mg/dL — ABNORMAL HIGH (ref 700–1600)

## 2017-04-24 ENCOUNTER — Telehealth: Payer: Self-pay | Admitting: *Deleted

## 2017-04-24 NOTE — Telephone Encounter (Addendum)
Patient is aware of results  ----- Message from [name], MD sent at 04/23/2017  8:01 PM EDT ----- Call - the abnormal protein is holding stable!!!  pete

## 2017-05-01 DIAGNOSIS — M79675 Pain in left toe(s): Secondary | ICD-10-CM | POA: Diagnosis not present

## 2017-05-01 DIAGNOSIS — M79674 Pain in right toe(s): Secondary | ICD-10-CM | POA: Insufficient documentation

## 2017-05-01 DIAGNOSIS — M7742 Metatarsalgia, left foot: Secondary | ICD-10-CM | POA: Diagnosis not present

## 2017-05-01 DIAGNOSIS — M2040 Other hammer toe(s) (acquired), unspecified foot: Secondary | ICD-10-CM | POA: Insufficient documentation

## 2017-05-01 DIAGNOSIS — M7741 Metatarsalgia, right foot: Secondary | ICD-10-CM | POA: Diagnosis not present

## 2017-05-03 ENCOUNTER — Encounter: Payer: Self-pay | Admitting: Cardiology

## 2017-05-15 NOTE — Progress Notes (Signed)
HPI: FU hypertension. Echocardiogram in October of 2012 showed normal LV function, grade 1 diastolic dysfunction, and mild left atrial enlargement. She has been intolerant to ARB's, spironolactone and Norvasc per her report. She states HCTZ caused "cramps". Since last seen, the patient denies any dyspnea on exertion, orthopnea, PND, pedal edema, palpitations, syncope or chest pain.   Current Outpatient Medications  Medication Sig Dispense Refill  . Acetaminophen (TYLENOL EXTRA STRENGTH PO) Take 2 tablets by mouth as needed.    . Ascorbic Acid (VITAMIN C) 100 MG tablet Take 100 mg by mouth daily.    . Biotin w/ Vitamins C & E (HAIR/SKIN/NAILS PO) Take by mouth.    . Calcium Carb-Cholecalciferol (CALCIUM CARBONATE-VITAMIN D3 PO) Take by mouth.    . Cholecalciferol (VITAMIN D3) 2000 units capsule Take by mouth.    . fexofenadine (ALLEGRA) 180 MG tablet Take 1 tablet (180 mg total) by mouth daily. 90 tablet 1  . fluticasone (FLONASE) 50 MCG/ACT nasal spray Place into both nostrils daily.    Marland Kitchen losartan (COZAAR) 50 MG tablet Take 100 mg by mouth.    . Multiple Vitamin (MULTIVITAMIN) tablet Take 1 tablet by mouth daily.      . nebivolol (BYSTOLIC) 10 MG tablet Take 2 tablets (20 mg total) by mouth daily. 180 tablet 3  . verapamil (CALAN-SR) 240 MG CR tablet Take 1 tablet (240 mg total) by mouth at bedtime. 90 tablet 1   No current facility-administered medications for this visit.      Past Medical History:  Diagnosis Date  . Anemia   . Asthma 11/30/2015  . Bleeding hemorrhoids 06/05/2015  . DDD (degenerative disc disease), cervical 11/30/2015  . DDD (degenerative disc disease), lumbar 11/30/2015  . Diabetes mellitus   . Edema    LLE- podiatry in [place]  . Endometriosis   . Fibromyalgia 11/30/2015  . Goiter   . Hemorrhoids   . Hyperlipidemia   . Hypertension   . Iron malabsorption 06/05/2015  . Lumbosacral radiculopathy at S1 07/18/2014   Bilateral  . MGUS (monoclonal gammopathy of  unknown significance) 06/05/2015  . Osteoarthritis of both hands 11/30/2015  . Osteoarthritis of both knees 11/30/2015  . Osteopenia   . Osteoporosis 11/30/2015  . Other iron deficiency anemias 06/05/2015  . Pain, joint, hip, right    chronic- Dr Alvan Dame Dr Nelva Bush  . Prediabetes   . Pseudogout 11/30/2015  . Sleep apnea     Past Surgical History:  Procedure Laterality Date  . Back Injections    . ESOPHAGOGASTRODUODENOSCOPY  7-11   for anemia  . FLEXIBLE SIGMOIDOSCOPY  01/31/2011   Procedure: FLEXIBLE SIGMOIDOSCOPY;  Surgeon: Missy Sabins, MD;  Location: [place];  Service: Endoscopy;  Laterality: N/A;  . TOTAL ABDOMINAL HYSTERECTOMY  1995   bilat oophorectomy/ endometriosis  . VULVAR LESION REMOVAL  09/07/2011   Procedure: VULVAR LESION;  Surgeon: Alvino Chapel, MD;  Location: [facility];  Service: Gynecology;  Laterality: N/A;  vaginal lesion    Social History   Socioeconomic History  . Marital status: Married    Spouse name: Not on file  . Number of children: 2  . Years of education: Not on file  . Highest education level: Not on file  Occupational History  . 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 Financial resource strain: Not on file  . Food insecurity:    Worry: Not on file  Inability: Not on file  . Transportation needs:    Medical: Not on file    Non-medical: Not on file  Tobacco Use  . Smoking status: Never Smoker  . Smokeless tobacco: Never Used  Substance and Sexual Activity  . Alcohol use: No    Alcohol/week: 0.0 oz    Comment: Doesn't drink   . Drug use: No  . Sexual activity: Yes    Birth control/protection: Surgical  Lifestyle  . Physical activity:    Days per week: Not on file    Minutes per session: Not on file  . Stress: Not on file  Relationships  . Social connections:    Talks on phone: Not on file    Gets together: Not on file    Attends religious service: Not on  file    Active member of club or organization: Not on file    Attends meetings of clubs or organizations: Not on file    Relationship status: Not on file  . Intimate partner violence:    Fear of current or ex partner: Not on file    Emotionally abused: Not on file    Physically abused: Not on file    Forced sexual activity: Not on file  Other Topics Concern  . Not on file  Social History Narrative   Lives in [place] with husband and daughter. Ambulatory, no cane or walker. Works at [employer].    [employer].   Right handed.   Caffeine none.    Family History  Problem Relation Age of Onset  . Rheum arthritis Mother   . Hypertension Mother   . Cancer Father        bladder  . Heart attack Father        MI at age 12  . Diabetes Brother   . Diabetes Brother   . Diabetes Brother   . Diabetes Brother   . Sarcoidosis Brother   . Sarcoidosis Sister   . Diabetes Sister   . Polycystic ovary syndrome Daughter   . Hypertension Daughter   . Anesthesia problems Neg Hx   . Hypotension Neg Hx   . Malignant hyperthermia Neg Hx   . Pseudochol deficiency Neg Hx     ROS: no fevers or chills, productive cough, hemoptysis, dysphasia, odynophagia, melena, hematochezia, dysuria, hematuria, rash, seizure activity, orthopnea, PND, pedal edema, claudication. Remaining systems are negative.  Physical Exam: Well-developed well-nourished in no acute distress.  Skin is warm and dry.  HEENT is normal.  Neck is supple.  Chest is clear to auscultation with normal expansion.  Cardiovascular exam is regular rate and rhythm.  Abdominal exam nontender or distended. No masses palpated. Extremities show no edema. neuro grossly intact  Electrocardiogram shows sinus bradycardia at a rate of 59.  No ST changes.  A/P  1 hypertension-  blood pressure remains elevated. Add Spironolactone 25 mg daily.  Check potassium and renal function in 1 week.  Follow blood pressure and adjust  regimen as needed.  Multiple medication intolerances.  We can consider trial of clonidine in the future.  I cannot advance verapamil or beta-blocker as heart rate in the 50s.  We discussed lifestyle modification including low-sodium diet, weight loss and exercise.  She does not consume alcohol.  2 hyperlipidemia-managed by primary care.  3 diabetes mellitus-management per primary care.  Kirk Ruths, MD

## 2017-05-17 ENCOUNTER — Ambulatory Visit: Payer: BLUE CROSS/BLUE SHIELD | Admitting: Cardiology

## 2017-05-17 ENCOUNTER — Encounter: Payer: Self-pay | Admitting: Cardiology

## 2017-05-17 VITALS — BP 159/89 | HR 59 | Ht 66.0 in | Wt 227.0 lb

## 2017-05-17 DIAGNOSIS — E7800 Pure hypercholesterolemia, unspecified: Secondary | ICD-10-CM | POA: Diagnosis not present

## 2017-05-17 DIAGNOSIS — I10 Essential (primary) hypertension: Secondary | ICD-10-CM

## 2017-05-17 DIAGNOSIS — E119 Type 2 diabetes mellitus without complications: Secondary | ICD-10-CM | POA: Diagnosis not present

## 2017-05-17 MED ORDER — SPIRONOLACTONE 25 MG PO TABS: 25.0000 mg | ORAL_TABLET | Freq: Every day | ORAL | 3 refills | Status: DC

## 2017-05-17 NOTE — Patient Instructions (Signed)
Medication Instructions:   START SPIRONOLACTONE 25 MG ONCE DAILY  DO NOT TAKE POTASSIUM  Labwork:  Your physician recommends that you return for lab work in: [place]:  Your physician wants you to follow-up in: [place] will receive a reminder letter in the mail two months in advance. If you don't receive a letter, please call our office to schedule the follow-up appointment.   If you need a refill on your cardiac medications before your next appointment, please call your pharmacy.

## 2017-05-18 DIAGNOSIS — D62 Acute posthemorrhagic anemia: Secondary | ICD-10-CM | POA: Diagnosis not present

## 2017-05-25 ENCOUNTER — Other Ambulatory Visit: Payer: Self-pay | Admitting: *Deleted

## 2017-05-25 ENCOUNTER — Telehealth: Payer: Self-pay | Admitting: *Deleted

## 2017-05-25 DIAGNOSIS — D508 Other iron deficiency anemias: Secondary | ICD-10-CM

## 2017-05-25 DIAGNOSIS — H0014 Chalazion left upper eyelid: Secondary | ICD-10-CM | POA: Diagnosis not present

## 2017-05-25 NOTE — Telephone Encounter (Signed)
Patient is reconsidering having an iron infusion. She would like lab work drawn tomorrow at her doctors appointment to see what her levels are, and then if needed, she can make appointment. Orders placed. Patient advised that her MD may not be able to draw labs per our orders and she may need to come into this office for draw. She understands.

## 2017-05-26 ENCOUNTER — Inpatient Hospital Stay: Payer: BLUE CROSS/BLUE SHIELD | Attending: Hematology & Oncology

## 2017-05-26 DIAGNOSIS — D508 Other iron deficiency anemias: Secondary | ICD-10-CM

## 2017-05-26 DIAGNOSIS — I10 Essential (primary) hypertension: Secondary | ICD-10-CM | POA: Diagnosis not present

## 2017-05-26 DIAGNOSIS — D472 Monoclonal gammopathy: Secondary | ICD-10-CM | POA: Diagnosis not present

## 2017-05-26 LAB — CBC WITH DIFFERENTIAL (CANCER CENTER ONLY)
Basophils Absolute: 0 10*3/uL (ref 0.0–0.1)
Basophils Relative: 0 %
Eosinophils Absolute: 0.1 10*3/uL (ref 0.0–0.5)
Eosinophils Relative: 1 %
HCT: 35.9 % (ref 34.8–46.6)
Hemoglobin: 11.7 g/dL (ref 11.6–15.9)
LYMPHS ABS: 2.4 10*3/uL (ref 0.9–3.3)
LYMPHS PCT: 32 %
MCH: 27.1 pg (ref 26.0–34.0)
MCHC: 32.6 g/dL (ref 32.0–36.0)
MCV: 83.1 fL (ref 81.0–101.0)
MONO ABS: 0.6 10*3/uL (ref 0.1–0.9)
MONOS PCT: 7 %
Neutro Abs: 4.5 10*3/uL (ref 1.5–6.5)
Neutrophils Relative %: 60 %
Platelet Count: 269 10*3/uL (ref 145–400)
RBC: 4.32 MIL/uL (ref 3.70–5.32)
RDW: 14.1 % (ref 11.1–15.7)
WBC Count: 7.5 10*3/uL (ref 3.9–10.0)

## 2017-05-26 LAB — IRON AND TIBC
Iron: 66 ug/dL (ref 41–142)
Saturation Ratios: 22 % (ref 21–57)
TIBC: 305 ug/dL (ref 236–444)
UIBC: 239 ug/dL

## 2017-05-26 LAB — FERRITIN: Ferritin: 82 ng/mL (ref 9–269)

## 2017-05-27 LAB — BASIC METABOLIC PANEL
BUN/Creatinine Ratio: 14 (ref 9–23)
BUN: 12 mg/dL (ref 6–24)
CALCIUM: 9.1 mg/dL (ref 8.7–10.2)
CHLORIDE: 101 mmol/L (ref 96–106)
CO2: 21 mmol/L (ref 20–29)
Creatinine, Ser: 0.87 mg/dL (ref 0.57–1.00)
GFR calc non Af Amer: 74 mL/min/{1.73_m2} (ref >59)
GFR, EST AFRICAN AMERICAN: 85 mL/min/{1.73_m2} (ref >59)
Glucose: 126 mg/dL — ABNORMAL HIGH (ref 65–99)
POTASSIUM: 4.3 mmol/L (ref 3.5–5.2)
SODIUM: 137 mmol/L (ref 134–144)

## 2017-05-28 NOTE — Progress Notes (Signed)
All labs are normal. 

## 2017-06-01 DIAGNOSIS — H40013 Open angle with borderline findings, low risk, bilateral: Secondary | ICD-10-CM | POA: Diagnosis not present

## 2017-06-01 DIAGNOSIS — H04123 Dry eye syndrome of bilateral lacrimal glands: Secondary | ICD-10-CM | POA: Diagnosis not present

## 2017-06-01 DIAGNOSIS — H0014 Chalazion left upper eyelid: Secondary | ICD-10-CM | POA: Diagnosis not present

## 2017-06-01 DIAGNOSIS — H2513 Age-related nuclear cataract, bilateral: Secondary | ICD-10-CM | POA: Diagnosis not present

## 2017-07-12 DIAGNOSIS — G4733 Obstructive sleep apnea (adult) (pediatric): Secondary | ICD-10-CM | POA: Diagnosis not present

## 2017-07-12 DIAGNOSIS — Z9989 Dependence on other enabling machines and devices: Secondary | ICD-10-CM | POA: Diagnosis not present

## 2017-09-01 DIAGNOSIS — E119 Type 2 diabetes mellitus without complications: Secondary | ICD-10-CM | POA: Diagnosis not present

## 2017-09-01 DIAGNOSIS — E042 Nontoxic multinodular goiter: Secondary | ICD-10-CM | POA: Diagnosis not present

## 2017-09-01 DIAGNOSIS — E559 Vitamin D deficiency, unspecified: Secondary | ICD-10-CM | POA: Diagnosis not present

## 2017-09-01 LAB — HEMOGLOBIN A1C: Hemoglobin A1C: 6.8

## 2017-09-04 ENCOUNTER — Telehealth: Payer: Self-pay | Admitting: Cardiology

## 2017-09-04 NOTE — Telephone Encounter (Signed)
Patient is stating Dr. Stanford Breed changed some of her medications and she is feeling dizzy, foggy, and has an occasional headache. Please call at work at [phone], or after 1:00pm call [phone].

## 2017-09-04 NOTE — Telephone Encounter (Signed)
Left message for pt to call.

## 2017-09-05 ENCOUNTER — Other Ambulatory Visit: Payer: Self-pay | Admitting: *Deleted

## 2017-09-05 ENCOUNTER — Telehealth: Payer: Self-pay | Admitting: Cardiology

## 2017-09-05 MED ORDER — VERAPAMIL HCL ER 240 MG PO TBCR: 240.0000 mg | EXTENDED_RELEASE_TABLET | Freq: Every day | ORAL | 1 refills | Status: DC

## 2017-09-05 MED ORDER — SPIRONOLACTONE 25 MG PO TABS: 12.5000 mg | ORAL_TABLET | Freq: Every day | ORAL | 3 refills | Status: DC

## 2017-09-05 NOTE — Telephone Encounter (Signed)
Returned call to patient, she states she is calling in regards to [place] message sent yesterday.    Advised of Dr. Stanford Breed recommendations.      Patient states since May when verapamil was increased and spironolactone was added she feels "horrible".  She thought over time she would adjust and this get better but it has not improved.   She is nauseous, dizzy with quick movements, HA at the base of her skull, and very fatigued.    BP this AM 106/63.   She believes this is related to the medication as this is when this started.   She states her BP has been good but she continues to feel poorly.   Requesting a possible medication change or adjustment to see if symptoms improve.    Advised I would route to Dr. Stanford Breed for review.

## 2017-09-05 NOTE — Telephone Encounter (Signed)
New Message:    Pt return a call

## 2017-09-05 NOTE — Telephone Encounter (Signed)
Change spironolactone to 12.5 mg daily and follow BP Kirk Ruths

## 2017-09-05 NOTE — Telephone Encounter (Signed)
See my chart correspondence with the patient.

## 2017-09-05 NOTE — Telephone Encounter (Signed)
Spoke with pt, Aware of dr crenshaw's recommendations.  °

## 2017-10-01 ENCOUNTER — Emergency Department (INDEPENDENT_AMBULATORY_CARE_PROVIDER_SITE_OTHER)
Admission: EM | Admit: 2017-10-01 | Discharge: 2017-10-01 | Disposition: A | Discharge status: Home / Self Care | Payer: BLUE CROSS/BLUE SHIELD | Source: Home / Self Care | Attending: Family Medicine | Admitting: Family Medicine

## 2017-10-01 ENCOUNTER — Encounter: Payer: Self-pay | Admitting: *Deleted

## 2017-10-01 ENCOUNTER — Other Ambulatory Visit: Payer: Self-pay

## 2017-10-01 ENCOUNTER — Emergency Department (INDEPENDENT_AMBULATORY_CARE_PROVIDER_SITE_OTHER): Payer: BLUE CROSS/BLUE SHIELD

## 2017-10-01 DIAGNOSIS — S4991XA Unspecified injury of right shoulder and upper arm, initial encounter: Secondary | ICD-10-CM

## 2017-10-01 DIAGNOSIS — W57XXXA Bitten or stung by nonvenomous insect and other nonvenomous arthropods, initial encounter: Secondary | ICD-10-CM

## 2017-10-01 DIAGNOSIS — X500XXA Overexertion from strenuous movement or load, initial encounter: Secondary | ICD-10-CM

## 2017-10-01 DIAGNOSIS — M25511 Pain in right shoulder: Secondary | ICD-10-CM

## 2017-10-01 MED ORDER — TRIAMCINOLONE ACETONIDE 0.1 % EX CREA: 1.0000 "application " | TOPICAL_CREAM | Freq: Two times a day (BID) | CUTANEOUS | 0 refills | Status: DC

## 2017-10-01 MED ORDER — CETIRIZINE HCL 10 MG PO TABS: 10.0000 mg | ORAL_TABLET | Freq: Every day | ORAL | 0 refills | Status: DC

## 2017-10-01 MED ORDER — PERMETHRIN 5 % EX CREA: TOPICAL_CREAM | Freq: Once | CUTANEOUS | 0 refills | Status: AC

## 2017-10-01 NOTE — ED Provider Notes (Signed)
[facility]    CSN: [id number] Arrival date & time: 10/01/17  1338     History   Chief Complaint Chief Complaint  Patient presents with  . Shoulder Pain  . Rash    HPI CHASE KNEBEL is a 57 y.o. female.   HPI ONESHA KREBBS is a 57 y.o. female presenting to UC with c/o Right shoulder pain that started 1 week ago after putting a travel bag [place] convey[place] belt [place] the airport.  Pain is aching and sore, worse with certain movements. She does get temporary relief with Tylenol. No prior injury or surgery to same shoulder.   She is also c/o a itchy burning red rash that is scattered on her arms and legs.  She was staying with a family member who is renting a house. No one else has a rash.   Pt concerned the rash could be due to bed bugs.  She has tried a previously prescribed cream from her dermatologist for insect bites. No relief. Denies fever, chills, n/v/d or body aches.    Past Medical History:  Diagnosis Date  . Anemia   . Asthma 11/30/2015  . Bleeding hemorrhoids 06/05/2015  . DDD (degenerative disc disease), cervical 11/30/2015  . DDD (degenerative disc disease), lumbar 11/30/2015  . Diabetes mellitus   . Edema    LLE- podiatry in [place]  . Endometriosis   . Fibromyalgia 11/30/2015  . Goiter   . Hemorrhoids   . Hyperlipidemia   . Hypertension   . Iron malabsorption 06/05/2015  . Lumbosacral radiculopathy at S1 07/18/2014   Bilateral  . MGUS (monoclonal gammopathy of unknown significance) 06/05/2015  . Osteoarthritis of both hands 11/30/2015  . Osteoarthritis of both knees 11/30/2015  . Osteopenia   . Osteoporosis 11/30/2015  . Other iron deficiency anemias 06/05/2015  . Pain, joint, hip, right    chronic- Dr Alvan Dame Dr Nelva Bush  . Prediabetes   . Pseudogout 11/30/2015  . Sleep apnea     Patient Active Problem List   Diagnosis Date Noted  . DDD (degenerative disc disease), cervical 11/30/2015  . DDD (degenerative disc disease), lumbar 11/30/2015  .  Osteoarthritis of both hands 11/30/2015  . Osteoarthritis of both knees 11/30/2015  . Osteoporosis 11/30/2015  . Anemia 11/30/2015  . Bilateral primary osteoarthritis of knee 11/26/2015  . Food intolerance 07/03/2015  . MGUS (monoclonal gammopathy of unknown significance) 06/05/2015  . Other iron deficiency anemias 06/05/2015  . Iron malabsorption 06/05/2015  . Bleeding hemorrhoids 06/05/2015  . Other seasonal allergic rhinitis 11/29/2013  . Venous stasis 11/29/2013  . Cramping of feet 09/10/2013  . Right L4-L5 Lumbosacral facet joint syndrome 02/16/2012  . Hypertriglyceridemia 09/26/2011  . Vaginal intraepithelial neoplasia III (VAIN III) 08/23/2011  . OSA (obstructive sleep apnea) 03/29/2011  . Multinodular goiter 02/28/2011  . Internal hemorrhoids 01/31/2011  . Diabetes mellitus type 2, controlled, without complications ([place]) 99/24/2683  . Essential hypertension, benign 02/23/2010  . Edema 09/23/2009    Past Surgical History:  Procedure Laterality Date  . Back Injections    . ESOPHAGOGASTRODUODENOSCOPY  7-11   for anemia  . FLEXIBLE SIGMOIDOSCOPY  01/31/2011   Procedure: FLEXIBLE SIGMOIDOSCOPY;  Surgeon: Missy Sabins, MD;  Location: [place];  Service: Endoscopy;  Laterality: N/A;  . TOTAL ABDOMINAL HYSTERECTOMY  1995   bilat oophorectomy/ endometriosis  . VULVAR LESION REMOVAL  09/07/2011   Procedure: VULVAR LESION;  Surgeon: Alvino Chapel, MD;  Location[facility];  Service: Gynecology;  Laterality: N/A;  vaginal lesion    OB History   None      Home Medications    Prior to Admission medications   Medication Sig Start Date End Date Taking? Authorizing Provider  Acetaminophen (TYLENOL EXTRA STRENGTH PO) Take 2 tablets by mouth as needed.   Yes [provider]  Ascorbic Acid (VITAMIN C) 100 MG tablet Take 100 mg by mouth daily.    [provider]  Biotin w/ Vitamins C & E (HAIR/SKIN/NAILS PO) Take by mouth.     [provider]  Calcium Carb-Cholecalciferol (CALCIUM CARBONATE-VITAMIN D3 PO) Take by mouth.    [provider]  cetirizine (ZYRTEC) 10 MG tablet Take 1 tablet (10 mg total) by mouth daily. 10/01/17   Noe Gens, PA-C  Cholecalciferol (VITAMIN D3) 2000 units capsule Take by mouth.    [provider]  fexofenadine (ALLEGRA) 180 MG tablet Take 1 tablet (180 mg total) by mouth daily. 09/11/12   Hali Marry, MD  fluticasone (FLONASE) 50 MCG/ACT nasal spray Place into both nostrils daily.    [provider]  losartan (COZAAR) 50 MG tablet Take 100 mg by mouth. 12/14/16 05/17/17  [provider]  Multiple Vitamin (MULTIVITAMIN) tablet Take 1 tablet by mouth daily.      [provider]  nebivolol (BYSTOLIC) 10 MG tablet Take 2 tablets (20 mg total) by mouth daily. 03/27/17   Lelon Perla, MD  spironolactone (ALDACTONE) 25 MG tablet Take 0.5 tablets (12.5 mg total) by mouth daily. 09/05/17 12/04/17  Lelon Perla, MD  triamcinolone cream (KENALOG) 0.1 % Apply 1 application topically 2 (two) times daily. 10/01/17   Noe Gens, PA-C  verapamil (CALAN-SR) 240 MG CR tablet Take 1 tablet (240 mg total) by mouth at bedtime. 09/05/17   Lelon Perla, MD    Family History Family History  Problem Relation Age of Onset  . Rheum arthritis Mother   . Hypertension Mother   . Cancer Father        bladder  . Heart attack Father        MI at age 59  . Diabetes Brother   . Diabetes Brother   . Diabetes Brother   . Diabetes Brother   . Sarcoidosis Brother   . Sarcoidosis Sister   . Diabetes Sister   . Polycystic ovary syndrome Daughter   . Hypertension Daughter   . Anesthesia problems Neg Hx   . Hypotension Neg Hx   . Malignant hyperthermia Neg Hx   . Pseudochol deficiency Neg Hx     Social History Social History   Tobacco Use  . Smoking status: Never Smoker  . Smokeless tobacco: Never Used  Substance Use Topics  .  Alcohol use: No    Alcohol/week: 0.0 standard drinks    Comment: Doesn't drink   . Drug use: No     Allergies   Sulfamethoxazole; Pineapple; Sulfasalazine; Avapro [irbesartan]; Diovan [valsartan]; Onglyza [saxagliptin]; Spironolactone; Trulicity [dulaglutide]; Amlodipine besylate; Asparagus; Azithromycin; Chlorthalidone; Codeine; Hctz [hydrochlorothiazide]; Iodinated diagnostic agents; Metformin; Metformin and related; Other; Sulfa antibiotics; and Sulfonamide derivatives   Review of Systems Review of Systems  Musculoskeletal: Positive for arthralgias and myalgias. Negative for neck pain and neck stiffness.  Skin: Positive for rash.  Neurological: Negative for weakness and numbness.     Physical Exam Triage Vital Signs ED Triage Vitals  Enc Vitals Group     BP 10/01/17 1356 (!) 148/85     Pulse Rate 10/01/17 1356  66     Resp --      Temp 10/01/17 1356 98.5 F (36.9 C)     Temp Source 10/01/17 1356 Oral     SpO2 10/01/17 1356 100 %     Weight 10/01/17 1357 227 lb 12.8 oz (103.3 kg)     Height --      Head Circumference --      Peak Flow --      Pain Score 10/01/17 1357 0     Pain Loc --      Pain Edu? --      Excl. in [place]? --    No data found.  Updated Vital Signs BP (!) 148/85 (BP Location: Right Arm)   Pulse 66   Temp 98.5 F (36.9 C) (Oral)   Wt 227 lb 12.8 oz (103.3 kg)   SpO2 100%   BMI 36.77 kg/m   Visual Acuity Right Eye Distance:   Left Eye Distance:   Bilateral Distance:    Right Eye Near:   Left Eye Near:    Bilateral Near:     Physical Exam  Constitutional: She is oriented to person, place, and time. She appears well-developed and well-nourished. No distress.  HENT:  Head: Normocephalic and atraumatic.  Eyes: EOM are normal.  Neck: Normal range of motion.  Cardiovascular: Normal rate and regular rhythm.  Pulses:      Radial pulses are 2+ on the right side.  Pulmonary/Chest: Effort normal. No respiratory distress.  Musculoskeletal: Normal  range of motion. She exhibits tenderness. She exhibits no edema.  Right shoulder: tenderness to anterior aspect. Full ROM, increased pain on end range.  Full ROM elbow w/o tenderness  5/5 strength in elbow and grip strength.  Neurological: She is alert and oriented to person, place, and time.  Skin: Skin is warm and dry. Rash noted. She is not diaphoretic.  Diffuse erythematous papules on arms and legs. Some spots on hands near web spaces. Minimally tender rash. No track marks noted.  Psychiatric: She has a normal mood and affect. Her behavior is normal.  Nursing note and vitals reviewed.    UC Treatments / Results  Labs (all labs ordered are listed, but only abnormal results are displayed) Labs Reviewed - No data to display  EKG None  Radiology No results found.  Procedures Procedures (including critical care time)  Medications Ordered in UC Medications - No data to display  Initial Impression / Assessment and Plan / UC Course  I have reviewed the triage vital signs and the nursing notes.  Pertinent labs & imaging results that were available during my care of the patient were reviewed by me and considered in my medical decision making (see chart for details).     Concern for possible scabies due to some bites being on hands.  Will tx with permethrin cream. Offered sling for comfort for Right shoulder, pt declined.   Final Clinical Impressions(s) / UC Diagnoses   Final diagnoses:  Acute pain of right shoulder  Multiple insect bites     Discharge Instructions      Please use the medications as prescribed.  For the shoulder pain, you may also take ibuprofen with the tylenol to help with pain.   Please follow up with family medicine in 1 week if rash not improving. Please follow up with sports medicine in 1 week if shoulder pain not improving.    ED Prescriptions    Medication Sig Dispense Auth. Provider   triamcinolone cream (  KENALOG) 0.1 % Apply 1  application topically 2 (two) times daily. 30 g Gerarda Fraction, Errika Narvaiz O, PA-C   permethrin (ELIMITE) 5 % cream Apply topically once for 1 dose. Apply scalp to toes, avoid face. Leave on for 10-14 hours. Rinse thoroughly. 60 g Ardon Franklin O, PA-C   cetirizine (ZYRTEC) 10 MG tablet Take 1 tablet (10 mg total) by mouth daily. 30 tablet Noe Gens, PA-C     Controlled Substance Prescriptions [place] Controlled Substance Registry consulted? Not Applicable   Tyrell Antonio 10/04/17 [id number]

## 2017-10-01 NOTE — Discharge Instructions (Signed)
°  Please use the medications as prescribed.  For the shoulder pain, you may also take ibuprofen with the tylenol to help with pain.   Please follow up with family medicine in 1 week if rash not improving. Please follow up with sports medicine in 1 week if shoulder pain not improving.

## 2017-10-01 NOTE — ED Triage Notes (Signed)
Here with c/o right shoulder pain since last Wednesday after putting traveling bag on conveyer belt. Tylenol is helping. Rash to left arm, no spreading all over after staying at family member house. Tried prescribed topical spray.

## 2017-10-12 ENCOUNTER — Other Ambulatory Visit: Payer: BLUE CROSS/BLUE SHIELD

## 2017-10-12 ENCOUNTER — Ambulatory Visit: Payer: BLUE CROSS/BLUE SHIELD | Admitting: Hematology & Oncology

## 2017-10-18 ENCOUNTER — Encounter: Payer: Self-pay | Admitting: Hematology & Oncology

## 2017-10-20 ENCOUNTER — Ambulatory Visit: Payer: BLUE CROSS/BLUE SHIELD | Admitting: Hematology & Oncology

## 2017-10-20 ENCOUNTER — Inpatient Hospital Stay: Payer: BLUE CROSS/BLUE SHIELD | Attending: Hematology & Oncology

## 2017-10-20 DIAGNOSIS — D472 Monoclonal gammopathy: Secondary | ICD-10-CM | POA: Insufficient documentation

## 2017-10-20 DIAGNOSIS — K649 Unspecified hemorrhoids: Secondary | ICD-10-CM

## 2017-10-20 DIAGNOSIS — K909 Intestinal malabsorption, unspecified: Secondary | ICD-10-CM

## 2017-10-20 LAB — LACTATE DEHYDROGENASE: LDH: 144 U/L (ref 98–192)

## 2017-10-20 LAB — CMP (CANCER CENTER ONLY)
ALK PHOS: 118 U/L (ref 38–126)
ALT: 19 U/L (ref 0–44)
AST: 20 U/L (ref 15–41)
Albumin: 3.7 g/dL (ref 3.5–5.0)
Anion gap: 10 (ref 5–15)
BUN: 20 mg/dL (ref 6–20)
CHLORIDE: 102 mmol/L (ref 98–111)
CO2: 25 mmol/L (ref 22–32)
CREATININE: 1.24 mg/dL — AB (ref 0.44–1.00)
Calcium: 9.4 mg/dL (ref 8.9–10.3)
GFR, Est AFR Am: 54 mL/min — ABNORMAL LOW (ref >60)
GFR, Estimated: 47 mL/min — ABNORMAL LOW (ref >60)
GLUCOSE: 107 mg/dL — AB (ref 70–99)
Potassium: 4.3 mmol/L (ref 3.5–5.1)
SODIUM: 137 mmol/L (ref 135–145)
Total Bilirubin: 0.7 mg/dL (ref 0.3–1.2)
Total Protein: 8.5 g/dL — ABNORMAL HIGH (ref 6.5–8.1)

## 2017-10-20 LAB — CBC WITH DIFFERENTIAL (CANCER CENTER ONLY)
ABS IMMATURE GRANULOCYTES: 0.02 10*3/uL (ref 0.00–0.07)
BASOS PCT: 0 %
Basophils Absolute: 0 10*3/uL (ref 0.0–0.1)
Eosinophils Absolute: 0.2 10*3/uL (ref 0.0–0.5)
Eosinophils Relative: 2 %
HCT: 32.1 % — ABNORMAL LOW (ref 36.0–46.0)
HEMOGLOBIN: 10.1 g/dL — AB (ref 12.0–15.0)
IMMATURE GRANULOCYTES: 0 %
LYMPHS PCT: 33 %
Lymphs Abs: 2.4 10*3/uL (ref 0.7–4.0)
MCH: 27.5 pg (ref 26.0–34.0)
MCHC: 31.5 g/dL (ref 30.0–36.0)
MCV: 87.5 fL (ref 80.0–100.0)
Monocytes Absolute: 0.6 10*3/uL (ref 0.1–1.0)
Monocytes Relative: 8 %
NEUTROS ABS: 4.1 10*3/uL (ref 1.7–7.7)
NEUTROS PCT: 57 %
PLATELETS: 273 10*3/uL (ref 150–400)
RBC: 3.67 MIL/uL — AB (ref 3.87–5.11)
RDW: 12.8 % (ref 11.5–15.5)
WBC Count: 7.2 10*3/uL (ref 4.0–10.5)
nRBC: 0 % (ref 0.0–0.2)

## 2017-10-20 LAB — IRON AND TIBC
Iron: 73 ug/dL (ref 41–142)
Saturation Ratios: 23 % (ref 21–57)
TIBC: 318 ug/dL (ref 236–444)
UIBC: 244 ug/dL

## 2017-10-20 LAB — FERRITIN: Ferritin: 152 ng/mL (ref 11–307)

## 2017-10-21 LAB — IGG, IGA, IGM
IGA: 249 mg/dL (ref 87–352)
IgG (Immunoglobin G), Serum: 2348 mg/dL — ABNORMAL HIGH (ref 700–1600)
IgM (Immunoglobulin M), Srm: 84 mg/dL (ref 26–217)

## 2017-10-23 ENCOUNTER — Other Ambulatory Visit: Payer: Self-pay | Admitting: *Deleted

## 2017-10-23 ENCOUNTER — Encounter: Payer: Self-pay | Admitting: *Deleted

## 2017-10-23 LAB — KAPPA/LAMBDA LIGHT CHAINS
Kappa free light chain: 104.8 mg/L — ABNORMAL HIGH (ref 3.3–19.4)
Kappa, lambda light chain ratio: 8 — ABNORMAL HIGH (ref 0.26–1.65)
LAMDA FREE LIGHT CHAINS: 13.1 mg/L (ref 5.7–26.3)

## 2017-10-23 MED ORDER — LOSARTAN POTASSIUM 100 MG PO TABS: 100.0000 mg | ORAL_TABLET | Freq: Every day | ORAL | 3 refills | Status: DC

## 2017-10-23 NOTE — Telephone Encounter (Signed)
This encounter was created in error - please disregard.

## 2017-10-25 LAB — PROTEIN ELECTROPHORESIS, SERUM, WITH REFLEX
A/G Ratio: 0.8 (ref 0.7–1.7)
ALBUMIN ELP: 3.5 g/dL (ref 2.9–4.4)
ALPHA-1-GLOBULIN: 0.2 g/dL (ref 0.0–0.4)
Alpha-2-Globulin: 1 g/dL (ref 0.4–1.0)
Beta Globulin: 1.1 g/dL (ref 0.7–1.3)
GAMMA GLOBULIN: 2 g/dL — AB (ref 0.4–1.8)
Globulin, Total: 4.2 g/dL — ABNORMAL HIGH (ref 2.2–3.9)
M-Spike, %: 1.4 g/dL — ABNORMAL HIGH
SPEP Interpretation: 0
TOTAL PROTEIN ELP: 7.7 g/dL (ref 6.0–8.5)

## 2017-10-25 LAB — IMMUNOFIXATION REFLEX, SERUM
IgA: 280 mg/dL (ref 87–352)
IgG (Immunoglobin G), Serum: 2585 mg/dL — ABNORMAL HIGH (ref 700–1600)
IgM (Immunoglobulin M), Srm: 89 mg/dL (ref 26–217)

## 2017-10-26 ENCOUNTER — Ambulatory Visit: Payer: BLUE CROSS/BLUE SHIELD | Admitting: Family Medicine

## 2017-10-26 VITALS — BP 153/74 | HR 72 | Ht 66.0 in | Wt 233.0 lb

## 2017-10-26 DIAGNOSIS — M7581 Other shoulder lesions, right shoulder: Secondary | ICD-10-CM

## 2017-10-26 NOTE — Patient Instructions (Signed)
Thank you for coming in today. Attend Pt. Continue home exercises.  I think heat may be more helpful.  Recheck in 1 month.  If not getting better we can consider an injection or MRI.    Secondary Shoulder Impingement Syndrome Rehab Ask your health care provider which exercises are safe for you. Do exercises exactly as told by your health care provider and adjust them as directed. It is normal to feel mild stretching, pulling, tightness, or discomfort as you do these exercises, but you should stop right away if you feel sudden pain or your pain gets worse. Do not begin these exercises until told by your health care provider. Stretching and range of motion exercise This exercise warms up your muscles and joints and improves the movement and flexibility of your neck and shoulder. This exercise also helps to relieve pain and stiffness. Exercise A: Cervical side bend  1. Using good posture, sit on a stable chair, or stand up. 2. Without moving your shoulders, slowly tilt your left / right ear toward your left / right shoulder until you feel a stretch in your neck muscles. You should be looking straight ahead. 3. Hold for __________ seconds. 4. Slowly return to the starting position. 5. Repeat on your left / right side. Repeat __________ times. Complete this exercise __________ times a day. Strengthening exercises These exercises build strength and endurance in your shoulder. Endurance is the ability to use your muscles for a long time, even after they get tired. Exercise B: Scapular protraction, supine  1. Lie on your back on a firm surface. Hold a __________ weight in your left / right hand. 2. Raise your left / right arm straight into the air so your hand is directly above your shoulder joint. 3. Push the weight into the air so your shoulder lifts off of the surface that you are lying on. Do not move your head, neck, or back. 4. Hold for __________ seconds. 5. Slowly return to the starting  position. Let your muscles relax completely before you repeat this exercise. Repeat __________ times. Complete this exercise __________ times a day. Exercise C: Scapular retraction  1. Sit in a stable chair without armrests, or stand. 2. Secure an exercise band to a stable object in front of you so the band is at shoulder height. 3. Hold one end of the exercise band in each hand. Your palms should face down. 4. Squeeze your shoulder blades together and move your elbows slightly behind you. Do not shrug your shoulders while you do this. 5. Hold for __________ seconds. 6. Slowly return to the starting position. Repeat __________ times. Complete this exercise __________ times a day. Exercise D: Shoulder extension with scapular retraction  1. Sit in a stable chair without armrests, or stand. 2. Secure an exercise band to a stable object in front of you where it is above shoulder height. 3. Hold one end of the exercise band in each hand. 4. Straighten your elbows and lift your hands up to shoulder height. 5. Squeeze your shoulder blades together and pull your hands down to the sides of your thighs. Stop when your hands are straight down by your sides. Do not let your hands go behind your body. 6. Hold for __________ seconds. 7. Slowly return to the starting position. Repeat __________ times. Complete this exercise __________ times a day. Exercise E: Shoulder abduction 1. Sit in a stable chair without armrests, or stand. 2. If directed, hold a __________ weight in your left /  right hand. 3. Start with your arms straight down. Turn your left / right hand so your palm faces in, toward your body. 4. Slowly lift your left / right hand out to your side. Do not lift your hand above shoulder height. ? Keep your arms straight. ? Avoid shrugging your shoulder while you do this movement. Keep your shoulder blade tucked down toward the middle of your back. 5. Hold for __________ seconds. 6. Slowly lower  your arm, and return to the starting position. Repeat __________ times. Complete this exercise __________ times a day. This information is not intended to replace advice given to you by your health care provider. Make sure you discuss any questions you have with your health care provider. Document Released: 12/27/2004 Document Revised: 09/03/2015 Document Reviewed: 11/29/2014 Elsevier Interactive Patient Education  [employer].

## 2017-10-27 NOTE — Progress Notes (Signed)
 Donna Dyer is a 57 y.o. female who presents to [facility] today for right shoulder pain.  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  She developed right shoulder pain on September 18 while picking up [place] the airport.  Since then she has had pain in the anterior shoulder worse with activity better with rest.  She was seen in urgent care on the 22nd where x-rays were unremarkable.  She is been doing home exercise program since and notes the pain is improving but still present.  She denies any pain radiating below the level of her elbow she denies significant weakness or numbness.  Pain is worse with overhead motion reaching back and at bedtime.  She is tried some over-the-counter medicines for pain which have helped a bit as well.  No fevers chills vomiting or diarrhea.    ROS:  As above  Exam:  BP (!) 153/74   Pulse 72   Ht 5' 6" (1.676 m)   Wt 233 lb (105.7 kg)   BMI 37.61 kg/m  General: Well Developed, well nourished, and in no acute distress.  Neuro/Psych: Alert and oriented x3, extra-ocular muscles intact, able to move all 4 extremities, sensation grossly intact. Skin: Warm and dry, no rashes noted.  Respiratory: Not using accessory muscles, speaking in full sentences, trachea midline.  Cardiovascular: Pulses palpable, no extremity edema. Abdomen: Does not appear distended. MSK:  C-spine: Nontender to spinal midline.  Normal neck motion.  Upper extremity strength is equal and normal throughout.  Pulses capillary refill and reflexes equal and normal throughout.  Right shoulder: Normal-appearing not particularly tender. Normal range of motion however some pain with abduction present. Strength is intact throughout. Positive impingement testing including empty can testing mildly positive Hawkins and Neer's test. Negative biceps tendinitis  testing including Yergason's and speeds test.   Left shoulder normal-appearing nontender normal motion normal strength negative impingement testing.    Lab and Radiology Results EXAM: RIGHT SHOULDER - 2+ VIEW  COMPARISON:  None.  FINDINGS: No fracture. No evidence of shoulder separation or dislocation mild degenerative changes noted in the AC joint and at the rotator cuff insertion. No worrisome lytic or sclerotic osseous abnormality.  IMPRESSION: Negative.   Electronically Signed   By: Eric  Mansell M.D.   On: 10/01/2017 14:34  I personally (independently) visualized and performed the interpretation of the images attached in this note.     Assessment and Plan: 57 y.o. female with  Right shoulder pain likely rotator cuff tendinitis.  Patient already is having improvement with home exercise program.  Plan to refer to physical therapy and continue and advance home exercise program.  Patient does have a history of MGUS however routine lab work and so far continue to be within normal limits for this condition and x-rays did not show any signs of multiple myeloma on her shoulder recently.  Recheck in 1 month if not improving next step would be injection.  Return sooner if needed.   Health maintenance records abstracted from outside hospitals including A1c foot exam and mammogram. Orders Placed This Encounter  Procedures  . Ambulatory referral to Physical Therapy    Referral Priority:   Routine    Referral Type:   Physical Medicine    Referral Reason:   Specialty Services Required    Requested Specialty:   Physical Therapy    Number of Visits   Requested:   1   No orders of the defined types were placed in this encounter.   Historical information moved to improve visibility of documentation.  Past Medical History:  Diagnosis Date  . Anemia   . Asthma 11/30/2015  . Bleeding hemorrhoids 06/05/2015  . DDD (degenerative disc disease), cervical 11/30/2015  . DDD  (degenerative disc disease), lumbar 11/30/2015  . Diabetes mellitus   . Edema    LLE- podiatry in [place]  . Endometriosis   . Fibromyalgia 11/30/2015  . Goiter   . Hemorrhoids   . Hyperlipidemia   . Hypertension   . Iron malabsorption 06/05/2015  . Lumbosacral radiculopathy at S1 07/18/2014   Bilateral  . MGUS (monoclonal gammopathy of unknown significance) 06/05/2015  . Osteoarthritis of both hands 11/30/2015  . Osteoarthritis of both knees 11/30/2015  . Osteopenia   . Osteoporosis 11/30/2015  . Other iron deficiency anemias 06/05/2015  . Pain, joint, hip, right    chronic- Dr Alvan Dame Dr Nelva Bush  . Prediabetes   . Pseudogout 11/30/2015  . Sleep apnea    Past Surgical History:  Procedure Laterality Date  . Back Injections    . ESOPHAGOGASTRODUODENOSCOPY  7-11   for anemia  . FLEXIBLE SIGMOIDOSCOPY  01/31/2011   Procedure: FLEXIBLE SIGMOIDOSCOPY;  Surgeon: Missy Sabins, MD;  Location: [facility];  Service: Endoscopy;  Laterality: N/A;  . TOTAL ABDOMINAL HYSTERECTOMY  1995   bilat oophorectomy/ endometriosis  . VULVAR LESION REMOVAL  09/07/2011   Procedure: VULVAR LESION;  Surgeon: Alvino Chapel, MD;  Location: [facility];  Service: Gynecology;  Laterality: N/A;  vaginal lesion   Social History   Tobacco Use  . Smoking status: Never Smoker  . Smokeless tobacco: Never Used  Substance Use Topics  . Alcohol use: No    Alcohol/week: 0.0 standard drinks    Comment: Doesn't drink    family history includes Cancer in her father; Diabetes in her brother, brother, brother, brother, and sister; Heart attack in her father; Hypertension in her daughter and mother; Polycystic ovary syndrome in her daughter; Rheum arthritis in her mother; Sarcoidosis in her brother and sister.  Medications: Current Outpatient Medications  Medication Sig Dispense Refill  . Acetaminophen (TYLENOL EXTRA STRENGTH PO) Take 2 tablets by mouth as needed.    . Ascorbic Acid (VITAMIN C) 100  MG tablet Take 100 mg by mouth daily.    . Biotin w/ Vitamins C & E (HAIR/SKIN/NAILS PO) Take by mouth.    . Calcium Carb-Cholecalciferol (CALCIUM CARBONATE-VITAMIN D3 PO) Take by mouth.    . cetirizine (ZYRTEC) 10 MG tablet Take 1 tablet (10 mg total) by mouth daily. 30 tablet 0  . Cholecalciferol (VITAMIN D3) 2000 units capsule Take by mouth.    . fexofenadine (ALLEGRA) 180 MG tablet Take 1 tablet (180 mg total) by mouth daily. 90 tablet 1  . fluticasone (FLONASE) 50 MCG/ACT nasal spray Place into both nostrils daily.    Marland Kitchen losartan (COZAAR) 100 MG tablet Take 1 tablet (100 mg total) by mouth daily. 90 tablet 3  . Multiple Vitamin (MULTIVITAMIN) tablet Take 1 tablet by mouth daily.      . nebivolol (BYSTOLIC) 10 MG tablet Take 2 tablets (20 mg total) by mouth daily. 180 tablet 3  . spironolactone (ALDACTONE) 25 MG tablet Take 0.5 tablets (12.5 mg total) by mouth daily. 90 tablet 3  . triamcinolone cream (KENALOG) 0.1 % Apply 1 application topically 2 (two) times daily. 30 g 0  . verapamil (  CALAN-SR) 240 MG CR tablet Take 1 tablet (240 mg total) by mouth at bedtime. 90 tablet 1   No current facility-administered medications for this visit.    Allergies  Allergen Reactions  . Sulfamethoxazole Rash  . Pineapple Itching and Swelling    Tongue swells  . Sulfasalazine Rash  . Avapro [Irbesartan] Other (See Comments)  . Diovan [Valsartan] Other (See Comments)    Leg cramps   . Onglyza [Saxagliptin] Itching  . Spironolactone Other (See Comments)    Dizzy and nauseated.  Dizzy and nauseated.   . Trulicity [Dulaglutide] Itching    ? Hypersensitivity reaction  . Amlodipine Besylate Other (See Comments)    muscle cramps  . Asparagus Itching    Of the mouth  . Azithromycin Itching and Rash  . Chlorthalidone Other (See Comments)    Muscle cramps  . Codeine Nausea And Vomiting  . Hctz [Hydrochlorothiazide] Other (See Comments)    Muscle spasms    . Iodinated Diagnostic Agents     Never  had IV dye  . Metformin Diarrhea    Diarrhea when started on metformin IR 100mg daily Diarrhea when started on metformin IR 100mg daily Diarrhea when started on metformin IR 100mg daily  . Metformin And Related Diarrhea  . Other Itching    Never had IV dye  . Sulfa Antibiotics Rash    Other reaction(s): RASH Other reaction(s): RASH   . Sulfonamide Derivatives Rash      Discussed warning signs or symptoms. Please see discharge instructions. Patient expresses understanding.   

## 2017-11-01 NOTE — Progress Notes (Signed)
HPI: FUhypertension. Echocardiogram in October of 2012 showed normal LV function, grade 1 diastolic dysfunction, and mild left atrial enlargement. She states HCTZ caused "cramps".Since last seen,the patient denies any dyspnea on exertion, orthopnea, PND, pedal edema, palpitations, syncope or chest pain.   Current Outpatient Medications  Medication Sig Dispense Refill  . Acetaminophen (TYLENOL EXTRA STRENGTH PO) Take 2 tablets by mouth as needed.    . Ascorbic Acid (VITAMIN C) 100 MG tablet Take 100 mg by mouth daily.    . Biotin w/ Vitamins C & E (HAIR/SKIN/NAILS PO) Take by mouth.    . Calcium Carb-Cholecalciferol (CALCIUM CARBONATE-VITAMIN D3 PO) Take by mouth.    . cetirizine (ZYRTEC) 10 MG tablet Take 1 tablet (10 mg total) by mouth daily. 30 tablet 0  . Cholecalciferol (VITAMIN D3) 2000 units capsule Take by mouth.    . fexofenadine (ALLEGRA) 180 MG tablet Take 1 tablet (180 mg total) by mouth daily. 90 tablet 1  . fluticasone (FLONASE) 50 MCG/ACT nasal spray Place into both nostrils daily.    Marland Kitchen losartan (COZAAR) 100 MG tablet Take 1 tablet (100 mg total) by mouth daily. 90 tablet 3  . Multiple Vitamin (MULTIVITAMIN) tablet Take 1 tablet by mouth daily.      . nebivolol (BYSTOLIC) 10 MG tablet Take 2 tablets (20 mg total) by mouth daily. 180 tablet 3  . spironolactone (ALDACTONE) 25 MG tablet Take 0.5 tablets (12.5 mg total) by mouth daily. 90 tablet 3  . triamcinolone cream (KENALOG) 0.1 % Apply 1 application topically 2 (two) times daily. 30 g 0  . verapamil (CALAN-SR) 240 MG CR tablet Take 1 tablet (240 mg total) by mouth at bedtime. 90 tablet 1   No current facility-administered medications for this visit.      Past Medical History:  Diagnosis Date  . Anemia   . Asthma 11/30/2015  . Bleeding hemorrhoids 06/05/2015  . DDD (degenerative disc disease), cervical 11/30/2015  . DDD (degenerative disc disease), lumbar 11/30/2015  . Diabetes mellitus   . Edema    LLE-  podiatry in [place]  . Endometriosis   . Fibromyalgia 11/30/2015  . Goiter   . Hemorrhoids   . Hyperlipidemia   . Hypertension   . Iron malabsorption 06/05/2015  . Lumbosacral radiculopathy at S1 07/18/2014   Bilateral  . MGUS (monoclonal gammopathy of unknown significance) 06/05/2015  . Osteoarthritis of both hands 11/30/2015  . Osteoarthritis of both knees 11/30/2015  . Osteopenia   . Osteoporosis 11/30/2015  . Other iron deficiency anemias 06/05/2015  . Pain, joint, hip, right    chronic- Dr Alvan Dame Dr Nelva Bush  . Prediabetes   . Pseudogout 11/30/2015  . Sleep apnea     Past Surgical History:  Procedure Laterality Date  . Back Injections    . ESOPHAGOGASTRODUODENOSCOPY  7-11   for anemia  . FLEXIBLE SIGMOIDOSCOPY  01/31/2011   Procedure: FLEXIBLE SIGMOIDOSCOPY;  Surgeon: Missy Sabins, MD;  Location: [facility];  Service: Endoscopy;  Laterality: N/A;  . TOTAL ABDOMINAL HYSTERECTOMY  1995   bilat oophorectomy/ endometriosis  . VULVAR LESION REMOVAL  09/07/2011   Procedure: VULVAR LESION;  Surgeon: Alvino Chapel, MD;  Location: [facility];  Service: Gynecology;  Laterality: N/A;  vaginal lesion    Social History   Socioeconomic History  . Marital status: Married    Spouse name: Not on file  . Number of children: 2  . Years of education: Not on file  . Highest  education level: Not on file  Occupational History  . 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 Financial resource strain: Not on file  . Food insecurity:    Worry: Not on file    Inability: Not on file  . Transportation needs:    Medical: Not on file    Non-medical: Not on file  Tobacco Use  . Smoking status: Never Smoker  . Smokeless tobacco: Never Used  Substance and Sexual Activity  . Alcohol use: No    Alcohol/week: 0.0 standard drinks    Comment: Doesn't drink   . Drug use: No  . Sexual activity: Yes    Birth control/protection: Surgical    Lifestyle  . Physical activity:    Days per week: Not on file    Minutes per session: Not on file  . Stress: Not on file  Relationships  . Social connections:    Talks on phone: Not on file    Gets together: Not on file    Attends religious service: Not on file    Active member of club or organization: Not on file    Attends meetings of clubs or organizations: Not on file    Relationship status: Not on file  . Intimate partner violence:    Fear of current or ex partner: Not on file    Emotionally abused: Not on file    Physically abused: Not on file    Forced sexual activity: Not on file  Other Topics Concern  . Not on file  Social History Narrative   Lives in [place] with husband and daughter. Ambulatory, no cane or walker. Works at [employer].    [employer].   Right handed.   Caffeine none.    Family History  Problem Relation Age of Onset  . Rheum arthritis Mother   . Hypertension Mother   . Cancer Father        bladder  . Heart attack Father        MI at age 53  . Diabetes Brother   . Diabetes Brother   . Diabetes Brother   . Diabetes Brother   . Sarcoidosis Brother   . Sarcoidosis Sister   . Diabetes Sister   . Polycystic ovary syndrome Daughter   . Hypertension Daughter   . Anesthesia problems Neg Hx   . Hypotension Neg Hx   . Malignant hyperthermia Neg Hx   . Pseudochol deficiency Neg Hx     ROS: no fevers or chills, productive cough, hemoptysis, dysphasia, odynophagia, melena, hematochezia, dysuria, hematuria, rash, seizure activity, orthopnea, PND, pedal edema, claudication. Remaining systems are negative.  Physical Exam: Well-developed well-nourished in no acute distress.  Skin is warm and dry.  HEENT is normal.  Neck is supple.  Chest is clear to auscultation with normal expansion.  Cardiovascular exam is regular rate and rhythm.  Abdominal exam nontender or distended. No masses palpated. Extremities show no edema. neuro  grossly intact  A/P  1 hypertension-blood pressure controlled.  Continue present medications.  2 hyperlipidemia-managed by primary care.  3 diabetes mellitus-managed by primary care.  Kirk Ruths, MD

## 2017-11-02 ENCOUNTER — Encounter: Payer: Self-pay | Admitting: Family Medicine

## 2017-11-08 ENCOUNTER — Encounter: Payer: Self-pay | Admitting: Cardiology

## 2017-11-08 ENCOUNTER — Ambulatory Visit: Payer: BLUE CROSS/BLUE SHIELD | Admitting: Cardiology

## 2017-11-08 VITALS — BP 132/70 | HR 68 | Ht 66.0 in | Wt 231.8 lb

## 2017-11-08 DIAGNOSIS — I10 Essential (primary) hypertension: Secondary | ICD-10-CM | POA: Diagnosis not present

## 2017-11-08 DIAGNOSIS — M6281 Muscle weakness (generalized): Secondary | ICD-10-CM | POA: Diagnosis not present

## 2017-11-08 DIAGNOSIS — M7581 Other shoulder lesions, right shoulder: Secondary | ICD-10-CM | POA: Diagnosis not present

## 2017-11-08 DIAGNOSIS — E7800 Pure hypercholesterolemia, unspecified: Secondary | ICD-10-CM

## 2017-11-08 NOTE — Patient Instructions (Signed)
Medication Instructions:  Your physician recommends that you continue on your current medications as directed. Please refer to the Current Medication list given to you today.  If you need a refill on your cardiac medications before your next appointment, please call your pharmacy.   Lab work: None ordered  Testing/Procedures: None ordered  Follow-Up: At [facility], you and your health needs are our priority.  As part of our continuing mission to provide you with exceptional heart care, we have created designated Provider Care Teams.  These Care Teams include your primary Cardiologist (physician) and Advanced Practice Providers (APPs -  Physician Assistants and Nurse Practitioners) who all work together to provide you with the care you need, when you need it.  Your physician recommends that you schedule a follow-up appointment in: 1 year with Dr.Crenshaw   Any Other Special Instructions Will Be Listed Below (If Applicable).

## 2017-11-13 DIAGNOSIS — M75101 Unspecified rotator cuff tear or rupture of right shoulder, not specified as traumatic: Secondary | ICD-10-CM | POA: Diagnosis not present

## 2017-11-13 DIAGNOSIS — M6281 Muscle weakness (generalized): Secondary | ICD-10-CM | POA: Diagnosis not present

## 2017-11-16 DIAGNOSIS — M75101 Unspecified rotator cuff tear or rupture of right shoulder, not specified as traumatic: Secondary | ICD-10-CM | POA: Diagnosis not present

## 2017-11-16 DIAGNOSIS — M6281 Muscle weakness (generalized): Secondary | ICD-10-CM | POA: Diagnosis not present

## 2017-11-17 DIAGNOSIS — Z1239 Encounter for other screening for malignant neoplasm of breast: Secondary | ICD-10-CM | POA: Diagnosis not present

## 2017-11-17 DIAGNOSIS — Z1231 Encounter for screening mammogram for malignant neoplasm of breast: Secondary | ICD-10-CM | POA: Diagnosis not present

## 2017-11-17 LAB — HM MAMMOGRAPHY

## 2017-11-21 DIAGNOSIS — M75101 Unspecified rotator cuff tear or rupture of right shoulder, not specified as traumatic: Secondary | ICD-10-CM | POA: Diagnosis not present

## 2017-11-21 DIAGNOSIS — M6281 Muscle weakness (generalized): Secondary | ICD-10-CM | POA: Diagnosis not present

## 2017-11-24 DIAGNOSIS — M6281 Muscle weakness (generalized): Secondary | ICD-10-CM | POA: Diagnosis not present

## 2017-11-24 DIAGNOSIS — M75101 Unspecified rotator cuff tear or rupture of right shoulder, not specified as traumatic: Secondary | ICD-10-CM | POA: Diagnosis not present

## 2017-11-27 ENCOUNTER — Encounter: Payer: Self-pay | Admitting: Family Medicine

## 2017-11-27 ENCOUNTER — Ambulatory Visit: Payer: BLUE CROSS/BLUE SHIELD | Admitting: Family Medicine

## 2017-11-27 VITALS — BP 148/56 | HR 60 | Ht 66.0 in | Wt 229.0 lb

## 2017-11-27 DIAGNOSIS — D472 Monoclonal gammopathy: Secondary | ICD-10-CM

## 2017-11-27 DIAGNOSIS — M7581 Other shoulder lesions, right shoulder: Secondary | ICD-10-CM

## 2017-11-27 NOTE — Progress Notes (Signed)
Donna Dyer is a 57 y.o. female who presents to [place] today for follow-up on right shoulder rotator cuff tendinitis. Since her last visit on 10/17, Donna Dyer has been doing her home exercising and attending PT. She reports that she is doing much better. She takes Tylenol occasionally as needed for pain. She notes that she is now able to lift her arm overhead and control it moving downward; whereas at her last visit she was not able to control the downward movement. She feels that she should continue with exercise and PT and is not interested in an injection today.    ROS:  As above  Exam:  BP (!) 148/56   Pulse 60   Ht '5\' 6"'$  (1.676 m)   Wt 229 lb (103.9 kg)   BMI 36.96 kg/m  General: Well Developed, well nourished, and in no acute distress.  Neuro/Psych: Alert and oriented x3, extra-ocular muscles intact, able to move all 4 extremities, sensation grossly intact. Skin: Warm and dry, no rashes noted.  Respiratory: Not using accessory muscles, speaking in full sentences, trachea midline.  Cardiovascular: Pulses palpable, no extremity edema. Abdomen: Does not appear distended. MSK:  Right shoulder:  Normal inspection.  Mild tenderness to palpation over lateral upper arm and around the insertion of the rotator cuff tendons at head of humerus. Somewhat limited internal rotation. Otherwise normal ROM. Strength 5/5 with resisted abduction and empty can test. Pulses and capillary refill normal.  Contralateral shoulder: Normal ROM, strength, pulses and capillary refill.     Lab and Radiology Results CLINICAL DATA:  Shoulder injury moving luggage.  EXAM: RIGHT SHOULDER - 2+ VIEW  COMPARISON:  None.  FINDINGS: No fracture. No evidence of shoulder separation or dislocation mild degenerative changes noted in the [facility] joint and at the rotator cuff insertion. No worrisome lytic or sclerotic osseous  abnormality.  IMPRESSION: Negative.   Electronically Signed   By: Misty Stanley M.D.   On: 10/01/2017 14:34     Assessment and Plan: 57 y.o. female with  Right shoulder pain likely rotator cuff tendonitis: Donna Dyer is much improved since doing exercises and PT. She does not feel the need to consider injection at this time. Will continue with ~2-4 more weeks of PT.  Follow-up in one month if not improving.  Physical therapy reauthorized.  MGUS: Donna Dyer has a history of MGUS. On her last x-ray, there were no signs of multiple myeloma. Discussed this with patient.     Orders Placed This Encounter  Procedures  . Ambulatory referral to Physical Therapy    Referral Priority:   Routine    Referral Type:   Physical Medicine    Referral Reason:   Specialty Services Required    Requested Specialty:   Physical Therapy     Historical information moved to improve visibility of documentation.  Past Medical History:  Diagnosis Date  . Anemia   . Asthma 11/30/2015  . Bleeding hemorrhoids 06/05/2015  . DDD (degenerative disc disease), cervical 11/30/2015  . DDD (degenerative disc disease), lumbar 11/30/2015  . Diabetes mellitus   . Edema    LLE- podiatry in [place]  . Endometriosis   . Fibromyalgia 11/30/2015  . Goiter   . Hemorrhoids   . Hyperlipidemia   . Hypertension   . Iron malabsorption 06/05/2015  . Lumbosacral radiculopathy at S1 07/18/2014   Bilateral  . MGUS (monoclonal gammopathy of unknown significance) 06/05/2015  . Osteoarthritis of both hands 11/30/2015  .  Osteoarthritis of both knees 11/30/2015  . Osteopenia   . Osteoporosis 11/30/2015  . Other iron deficiency anemias 06/05/2015  . Pain, joint, hip, right    chronic- Dr Alvan Dame Dr Nelva Bush  . Prediabetes   . Pseudogout 11/30/2015  . Sleep apnea    Past Surgical History:  Procedure Laterality Date  . Back Injections    . ESOPHAGOGASTRODUODENOSCOPY  7-11   for anemia  . FLEXIBLE SIGMOIDOSCOPY  01/31/2011    Procedure: FLEXIBLE SIGMOIDOSCOPY;  Surgeon: Missy Sabins, MD;  Location: [facility];  Service: Endoscopy;  Laterality: N/A;  . TOTAL ABDOMINAL HYSTERECTOMY  1995   bilat oophorectomy/ endometriosis  . VULVAR LESION REMOVAL  09/07/2011   Procedure: VULVAR LESION;  Surgeon: Alvino Chapel, MD;  Location: [facility];  Service: Gynecology;  Laterality: N/A;  vaginal lesion   Social History   Tobacco Use  . Smoking status: Never Smoker  . Smokeless tobacco: Never Used  Substance Use Topics  . Alcohol use: No    Alcohol/week: 0.0 standard drinks    Comment: Doesn't drink    family history includes Cancer in her father; Diabetes in her brother, brother, brother, brother, and sister; Heart attack in her father; Hypertension in her daughter and mother; Polycystic ovary syndrome in her daughter; Rheum arthritis in her mother; Sarcoidosis in her brother and sister.  Medications: Current Outpatient Medications  Medication Sig Dispense Refill  . Acetaminophen (TYLENOL EXTRA STRENGTH PO) Take 2 tablets by mouth as needed.    . Ascorbic Acid (VITAMIN C) 100 MG tablet Take 100 mg by mouth daily.    . Biotin w/ Vitamins C & E (HAIR/SKIN/NAILS PO) Take by mouth.    . Calcium Carb-Cholecalciferol (CALCIUM CARBONATE-VITAMIN D3 PO) Take by mouth.    . cetirizine (ZYRTEC) 10 MG tablet Take 1 tablet (10 mg total) by mouth daily. 30 tablet 0  . Cholecalciferol (VITAMIN D3) 2000 units capsule Take by mouth.    . fexofenadine (ALLEGRA) 180 MG tablet Take 1 tablet (180 mg total) by mouth daily. 90 tablet 1  . fluticasone (FLONASE) 50 MCG/ACT nasal spray Place into both nostrils daily.    Marland Kitchen losartan (COZAAR) 100 MG tablet Take 1 tablet (100 mg total) by mouth daily. 90 tablet 3  . Multiple Vitamin (MULTIVITAMIN) tablet Take 1 tablet by mouth daily.      . nebivolol (BYSTOLIC) 10 MG tablet Take 2 tablets (20 mg total) by mouth daily. 180 tablet 3  . spironolactone (ALDACTONE) 25 MG  tablet Take 0.5 tablets (12.5 mg total) by mouth daily. 90 tablet 3  . triamcinolone cream (KENALOG) 0.1 % Apply 1 application topically 2 (two) times daily. 30 g 0  . verapamil (CALAN-SR) 240 MG CR tablet Take 1 tablet (240 mg total) by mouth at bedtime. 90 tablet 1   No current facility-administered medications for this visit.    Allergies  Allergen Reactions  . Sulfamethoxazole Rash  . Pineapple Itching and Swelling    Tongue swells Tongue swells  . Sulfasalazine Rash  . Avapro [Irbesartan] Other (See Comments)  . Onglyza [Saxagliptin] Itching  . Spironolactone Other (See Comments)    Dizzy and nauseated.  Dizzy and nauseated.  Dizzy and nauseated.  **currently taking w/ better toleration   . Valsartan Other (See Comments)    Leg cramps  Leg cramps **Currently taking w/ better toleration 07/12/17  . Amlodipine Besylate Other (See Comments)    muscle cramps  . Amlodipine Besylate Other (See Comments)  muscle cramps  . Asparagus Itching    Of the mouth  . Azithromycin Itching and Rash  . Chlorthalidone Other (See Comments)    Muscle cramps Muscle cramps  . Codeine Nausea And Vomiting  . Dulaglutide Itching    ? Hypersensitivity reaction ? Hypersensitivity reaction  . Hydrochlorothiazide Other (See Comments)    Muscle spasms   Muscle spasms   . Iodinated Diagnostic Agents     Never had IV dye Never had IV dye  . Metformin Diarrhea    Diarrhea when started on metformin IR '100mg'$  daily Diarrhea when started on metformin IR '100mg'$  daily Diarrhea when started on metformin IR '100mg'$  daily Diarrhea when started on metformin IR '100mg'$  daily  . Metformin And Related Diarrhea  . Other Itching    Never had IV dye  . Sulfa Antibiotics Rash    Other reaction(s): RASH Other reaction(s): RASH   . Sulfonamide Derivatives Rash      Discussed warning signs or symptoms. Please see discharge instructions. Patient expresses understanding.  I personally was present and  performed or re-performed the history, physical exam and medical decision-making activities of this service and have verified that the service and findings are accurately documented in the student's note. ___________________________________________ Lynne Leader M.D., ABFM., CAQSM. Primary Care and Sports Medicine Adjunct Instructor of [place] of [facility] of Medicine

## 2017-11-27 NOTE — Patient Instructions (Signed)
Thank you for coming in today. Continue PT.  Recheck if not improvied in 1 month or so.  Return sooner if needed.

## 2017-11-28 DIAGNOSIS — M6281 Muscle weakness (generalized): Secondary | ICD-10-CM | POA: Diagnosis not present

## 2017-11-28 DIAGNOSIS — M75101 Unspecified rotator cuff tear or rupture of right shoulder, not specified as traumatic: Secondary | ICD-10-CM | POA: Diagnosis not present

## 2017-11-29 DIAGNOSIS — H00021 Hordeolum internum right upper eyelid: Secondary | ICD-10-CM | POA: Diagnosis not present

## 2017-12-01 DIAGNOSIS — M75101 Unspecified rotator cuff tear or rupture of right shoulder, not specified as traumatic: Secondary | ICD-10-CM | POA: Diagnosis not present

## 2017-12-01 DIAGNOSIS — M6281 Muscle weakness (generalized): Secondary | ICD-10-CM | POA: Diagnosis not present

## 2017-12-05 DIAGNOSIS — M75101 Unspecified rotator cuff tear or rupture of right shoulder, not specified as traumatic: Secondary | ICD-10-CM | POA: Diagnosis not present

## 2017-12-05 DIAGNOSIS — M6281 Muscle weakness (generalized): Secondary | ICD-10-CM | POA: Diagnosis not present

## 2017-12-06 DIAGNOSIS — H04123 Dry eye syndrome of bilateral lacrimal glands: Secondary | ICD-10-CM | POA: Diagnosis not present

## 2017-12-06 DIAGNOSIS — H2513 Age-related nuclear cataract, bilateral: Secondary | ICD-10-CM | POA: Diagnosis not present

## 2017-12-06 DIAGNOSIS — H40013 Open angle with borderline findings, low risk, bilateral: Secondary | ICD-10-CM | POA: Diagnosis not present

## 2017-12-06 DIAGNOSIS — H00021 Hordeolum internum right upper eyelid: Secondary | ICD-10-CM | POA: Diagnosis not present

## 2017-12-28 ENCOUNTER — Encounter: Payer: Self-pay | Admitting: Family Medicine

## 2018-01-04 NOTE — Progress Notes (Signed)
Office Visit Note  Patient: Donna Dyer             Date of Birth: 05/28/1960           MRN: [id number]             PCP: Donna Marry, MD Referring: Donna Dyer, * Visit Date: 01/16/2018 Occupation: @GUAROCC @  Subjective:  Pain in both feet  History of Present Illness: Donna Dyer is a 57 y.o. female with history of osteoarthritis and DDD.  She presents today with pain in both feet that has progressively been worsening over the past 6 months.  She states she has noticed swelling and increased stiffness in her toes.  She has pain when bearing weight.  She denies any muscle cramping in her feet.  She states the pain has become constant.  She denies any ankle pain or swelling at this time.  She denies any other joint pain or joint swelling at this time.  She denies any neck or lower back pain.  She has intermittent right sided ischial bursitis.    Activities of Daily Living:  Patient reports morning stiffness for 0 minutes.   Patient Reports nocturnal pain.  Difficulty dressing/grooming: Denies Difficulty climbing stairs: Denies Difficulty getting out of chair: Denies Difficulty using hands for taps, buttons, cutlery, and/or writing: Denies  Review of Systems  Constitutional: Negative for fatigue.  HENT: Positive for mouth dryness. Negative for mouth sores, trouble swallowing, trouble swallowing and nose dryness.   Eyes: Positive for dryness. Negative for pain, redness, itching and visual disturbance.  Respiratory: Negative for cough, hemoptysis, shortness of breath, wheezing and difficulty breathing.   Cardiovascular: Negative for chest pain, palpitations, hypertension and swelling in legs/feet.  Gastrointestinal: Negative for abdominal pain, blood in stool, constipation and diarrhea.  Endocrine: Negative for increased urination.  Genitourinary: Negative for painful urination, nocturia and pelvic pain.  Musculoskeletal: Positive for arthralgias, joint  pain and joint swelling. Negative for myalgias, muscle weakness, morning stiffness, muscle tenderness and myalgias.  Skin: Negative for color change, pallor, rash, hair loss, nodules/bumps, skin tightness, ulcers and sensitivity to sunlight.  Allergic/Immunologic: Negative for susceptible to infections.  Neurological: Negative for dizziness, light-headedness, numbness, headaches, memory loss and weakness.  Hematological: Negative for bruising/bleeding tendency and swollen glands.  Psychiatric/Behavioral: Negative for depressed mood, confusion and sleep disturbance. The patient is not nervous/anxious.     PMFS History:  Patient Active Problem List   Diagnosis Date Noted  . DDD (degenerative disc disease), cervical 11/30/2015  . DDD (degenerative disc disease), lumbar 11/30/2015  . Osteoarthritis of both hands 11/30/2015  . Osteoarthritis of both knees 11/30/2015  . Osteoporosis 11/30/2015  . Anemia 11/30/2015  . Bilateral primary osteoarthritis of knee 11/26/2015  . Food intolerance 07/03/2015  . MGUS (monoclonal gammopathy of unknown significance) 06/05/2015  . Other iron deficiency anemias 06/05/2015  . Iron malabsorption 06/05/2015  . Bleeding hemorrhoids 06/05/2015  . Other seasonal allergic rhinitis 11/29/2013  . Venous stasis 11/29/2013  . Cramping of feet 09/10/2013  . Right L4-L5 Lumbosacral facet joint syndrome 02/16/2012  . Hypertriglyceridemia 09/26/2011  . Vaginal intraepithelial neoplasia III (VAIN III) 08/23/2011  . OSA (obstructive sleep apnea) 03/29/2011  . Multinodular goiter 02/28/2011  . Internal hemorrhoids 01/31/2011  . Diabetes mellitus type 2, controlled, without complications ([place]) 19/37/9024  . Essential hypertension, benign 02/23/2010  . Edema 09/23/2009    Past Medical History:  Diagnosis Date  . Anemia   . Asthma 11/30/2015  . Bleeding  hemorrhoids 06/05/2015  . DDD (degenerative disc disease), cervical 11/30/2015  . DDD (degenerative disc disease),  lumbar 11/30/2015  . Diabetes mellitus   . Edema    LLE- podiatry in [place]  . Endometriosis   . Fibromyalgia 11/30/2015  . Goiter   . Hemorrhoids   . Hyperlipidemia   . Hypertension   . Iron malabsorption 06/05/2015  . Lumbosacral radiculopathy at S1 07/18/2014   Bilateral  . MGUS (monoclonal gammopathy of unknown significance) 06/05/2015  . Osteoarthritis of both hands 11/30/2015  . Osteoarthritis of both knees 11/30/2015  . Osteopenia   . Osteoporosis 11/30/2015  . Other iron deficiency anemias 06/05/2015  . Pain, joint, hip, right    chronic- Dr Alvan Dame Dr Nelva Bush  . Prediabetes   . Pseudogout 11/30/2015  . Sleep apnea     Family History  Problem Relation Age of Onset  . Rheum arthritis Mother   . Hypertension Mother   . Cancer Father        bladder  . Heart attack Father        MI at age 49  . Diabetes Brother   . Diabetes Brother   . Diabetes Brother   . Diabetes Brother   . Sarcoidosis Brother   . Sarcoidosis Sister   . Diabetes Sister   . Polycystic ovary syndrome Daughter   . Hypertension Daughter   . Anesthesia problems Neg Hx   . Hypotension Neg Hx   . Malignant hyperthermia Neg Hx   . Pseudochol deficiency Neg Hx    Past Surgical History:  Procedure Laterality Date  . Back Injections    . ESOPHAGOGASTRODUODENOSCOPY  7-11   for anemia  . FLEXIBLE SIGMOIDOSCOPY  01/31/2011   Procedure: FLEXIBLE SIGMOIDOSCOPY;  Surgeon: Missy Sabins, MD;  Location: [facility];  Service: Endoscopy;  Laterality: N/A;  . TOTAL ABDOMINAL HYSTERECTOMY  1995   bilat oophorectomy/ endometriosis  . VULVAR LESION REMOVAL  09/07/2011   Procedure: VULVAR LESION;  Surgeon: Alvino Chapel, MD;  Location: [facility];  Service: Gynecology;  Laterality: N/A;  vaginal lesion   Social History   Social History Narrative   Lives in [place] with husband and daughter. Ambulatory, no cane or walker. Works at [employer].    [employer].   Right handed.    Caffeine none.    Objective: Vital Signs: BP 131/75 (BP Location: Left Arm, Patient Position: Sitting, Cuff Size: Normal)   Pulse 64   Resp 14   Ht 5\' 6"  (1.676 m)   Wt 229 lb 3.2 oz (104 kg)   BMI 36.99 kg/m    Physical Exam Vitals signs and nursing note reviewed.  Constitutional:      Appearance: She is well-developed.  HENT:     Head: Normocephalic and atraumatic.  Eyes:     Conjunctiva/sclera: Conjunctivae normal.  Neck:     Musculoskeletal: Normal range of motion.  Cardiovascular:     Rate and Rhythm: Normal rate and regular rhythm.     Heart sounds: Normal heart sounds.  Pulmonary:     Effort: Pulmonary effort is normal.     Breath sounds: Normal breath sounds.  Abdominal:     General: Bowel sounds are normal.     Palpations: Abdomen is soft.  Lymphadenopathy:     Cervical: No cervical adenopathy.  Skin:    General: Skin is warm and dry.     Capillary Refill: Capillary refill takes less than 2 seconds.  Neurological:  Mental Status: She is alert and oriented to person, place, and time.  Psychiatric:        Behavior: Behavior normal.      Musculoskeletal Exam: C-spine, thoracic spine, and lumbar spine good ROM.  No midline spinal tenderness.  No SI joint tenderness.  Shoulder joints, elbow joints, wrist joints, MCPs, PIPs, and DIPs good ROM with no synovitis.  Complete fist formation bilaterally.  Hip joints, knee joints, ankle joints good ROM with no synovitis.  No warmth or effusion of knee joints.  No tenderness or swelling of ankle joints.  She has tenderness of all MTPs and PIPs in both feet.  She has no plantar fasciitis or achilles tendonitis.   CDAI Exam: CDAI Score: Not documented Patient Global Assessment: Not documented; Provider Global Assessment: Not documented Swollen: Not documented; Tender: Not documented Joint Exam   Not documented   There is currently no information documented on the homunculus. Go to the Rheumatology activity and  complete the homunculus joint exam.  Investigation: No additional findings.  Imaging: No results found.  Recent Labs: Lab Results  Component Value Date   WBC 7.2 10/20/2017   HGB 10.1 (L) 10/20/2017   PLT 273 10/20/2017   NA 137 10/20/2017   K 4.3 10/20/2017   CL 102 10/20/2017   CO2 25 10/20/2017   GLUCOSE 107 (H) 10/20/2017   BUN 20 10/20/2017   CREATININE 1.24 (H) 10/20/2017   BILITOT 0.7 10/20/2017   ALKPHOS 118 10/20/2017   AST 20 10/20/2017   ALT 19 10/20/2017   PROT 8.5 (H) 10/20/2017   ALBUMIN 3.7 10/20/2017   CALCIUM 9.4 10/20/2017   GFRAA 54 (L) 10/20/2017    Speciality Comments: No specialty comments available.  Procedures:  No procedures performed Allergies: Sulfamethoxazole; Pineapple; Sulfasalazine; Avapro [irbesartan]; Onglyza [saxagliptin]; Spironolactone; Valsartan; Amlodipine besylate; Amlodipine besylate; Asparagus; Azithromycin; Chlorthalidone; Codeine; Dulaglutide; Hydrochlorothiazide; Iodinated diagnostic agents; Metformin; Metformin and related; Other; Sulfa antibiotics; and Sulfonamide derivatives   Assessment / Plan:     Visit Diagnoses: Ischial bursitis of right side: She is asymptomatic at this time.  She has intermittent symptoms.  She performs stretching exercises which help.   Primary osteoarthritis of both hands: She has mild PIP and DIP synovial thickening.  She has no synovitis.  She has complete fist formation bilaterally.  Joint protection and muscle strengthening were discussed.   Primary osteoarthritis of both knees:  No warmth or effusion.  Good ROM with no discomfort.    Pain in both feet - She presents today with progressively worsening pain in both feet.  She has tenderness of all MTPs and PIPs.  She has noticed intermittent joint swelling and increased stifness in both feet.  She describes the pain as constant and worse when bearing weight.  She has tried wearing different shoes. X-rays of both feet were obtained today.  X-rays  were consistent with osteoarthritis of both feet.  We will proceed with scheduling an ultrasound of both feet to assess for synovitis.  We will also obtain the following labs: RF, CCP, 14-3-3 eta, ACE, sed rate, and uric acid. Plan: XR Foot 2 Views Left, XR Foot 2 Views Right   DDD (degenerative disc disease), cervical: She has good ROM with no discomfort.  She has no symptoms of   DDD (degenerative disc disease), lumbar: She has good ROM with no discomfort.  No midline spinal tenderness.   MGUS (monoclonal gammopathy of unknown significance) - Followed up by Dr. Marin Olp  Other medical conditions are listed  as follows:   Vaginal intraepithelial neoplasia III (VAIN III)  History of anemia  Bleeding hemorrhoids  Multinodular goiter  History of sleep apnea  History of hypertension  History of obesity  Hypertriglyceridemia  History of diabetes mellitus    Orders: Orders Placed This Encounter  Procedures  . XR Foot 2 Views Left  . XR Foot 2 Views Right  . 14-3-3 eta Protein  . Cyclic citrul peptide antibody, IgG  . Rheumatoid factor  . Sedimentation rate  . Uric acid  . Angiotensin converting enzyme   No orders of the defined types were placed in this encounter.   Face-to-face time spent with patient was 30 minutes. Greater than 50% of time was spent in counseling and coordination of care.  Follow-Up Instructions: Return for Osteoarthritis, DDD.   Ofilia Neas, PA-C   I examined and evaluated the patient with Hazel Sams PA.  She has been experiencing a lot of foot discomfort.  I do not see any synovitis on examination.  I will schedule ultrasound of bilateral feet to evaluate for synovitis.  I will also obtain some labs to look for any inflammatory process.  The plan of care was discussed as noted above.  Bo Merino, MD  Note - This record has been created using Editor, commissioning.  Chart creation errors have been sought, but may not always  have been  located. Such creation errors do not reflect on  the standard of medical care.

## 2018-01-08 ENCOUNTER — Other Ambulatory Visit: Payer: Self-pay | Admitting: Cardiology

## 2018-01-08 DIAGNOSIS — L300 Nummular dermatitis: Secondary | ICD-10-CM | POA: Diagnosis not present

## 2018-01-16 ENCOUNTER — Ambulatory Visit: Payer: BLUE CROSS/BLUE SHIELD | Admitting: Rheumatology

## 2018-01-16 ENCOUNTER — Ambulatory Visit (INDEPENDENT_AMBULATORY_CARE_PROVIDER_SITE_OTHER): Payer: Self-pay

## 2018-01-16 ENCOUNTER — Encounter: Payer: Self-pay | Admitting: Rheumatology

## 2018-01-16 VITALS — BP 131/75 | HR 64 | Resp 14 | Ht 66.0 in | Wt 229.2 lb

## 2018-01-16 DIAGNOSIS — E781 Pure hyperglyceridemia: Secondary | ICD-10-CM

## 2018-01-16 DIAGNOSIS — M170 Bilateral primary osteoarthritis of knee: Secondary | ICD-10-CM | POA: Diagnosis not present

## 2018-01-16 DIAGNOSIS — M79671 Pain in right foot: Secondary | ICD-10-CM

## 2018-01-16 DIAGNOSIS — Z8639 Personal history of other endocrine, nutritional and metabolic disease: Secondary | ICD-10-CM

## 2018-01-16 DIAGNOSIS — K649 Unspecified hemorrhoids: Secondary | ICD-10-CM

## 2018-01-16 DIAGNOSIS — Z862 Personal history of diseases of the blood and blood-forming organs and certain disorders involving the immune mechanism: Secondary | ICD-10-CM

## 2018-01-16 DIAGNOSIS — M79672 Pain in left foot: Secondary | ICD-10-CM

## 2018-01-16 DIAGNOSIS — E042 Nontoxic multinodular goiter: Secondary | ICD-10-CM

## 2018-01-16 DIAGNOSIS — M7071 Other bursitis of hip, right hip: Secondary | ICD-10-CM | POA: Diagnosis not present

## 2018-01-16 DIAGNOSIS — M5136 Other intervertebral disc degeneration, lumbar region: Secondary | ICD-10-CM

## 2018-01-16 DIAGNOSIS — M19041 Primary osteoarthritis, right hand: Secondary | ICD-10-CM

## 2018-01-16 DIAGNOSIS — Z8669 Personal history of other diseases of the nervous system and sense organs: Secondary | ICD-10-CM

## 2018-01-16 DIAGNOSIS — Z8679 Personal history of other diseases of the circulatory system: Secondary | ICD-10-CM

## 2018-01-16 DIAGNOSIS — M19042 Primary osteoarthritis, left hand: Secondary | ICD-10-CM

## 2018-01-16 DIAGNOSIS — M5030 Other cervical disc degeneration, unspecified cervical region: Secondary | ICD-10-CM

## 2018-01-16 DIAGNOSIS — D072 Carcinoma in situ of vagina: Secondary | ICD-10-CM

## 2018-01-16 DIAGNOSIS — D472 Monoclonal gammopathy: Secondary | ICD-10-CM

## 2018-01-17 ENCOUNTER — Ambulatory Visit (INDEPENDENT_AMBULATORY_CARE_PROVIDER_SITE_OTHER): Payer: BLUE CROSS/BLUE SHIELD | Admitting: Rheumatology

## 2018-01-17 ENCOUNTER — Ambulatory Visit (INDEPENDENT_AMBULATORY_CARE_PROVIDER_SITE_OTHER): Payer: Self-pay

## 2018-01-17 DIAGNOSIS — M79671 Pain in right foot: Secondary | ICD-10-CM

## 2018-01-17 DIAGNOSIS — M79672 Pain in left foot: Secondary | ICD-10-CM | POA: Diagnosis not present

## 2018-01-18 NOTE — Progress Notes (Signed)
Office Visit Note  Patient: Donna Dyer             Date of Birth: [birth year]           MRN: [id number]             PCP: Hali Marry, MD Referring: Hali Marry, * Visit Date: 01/24/2018 Occupation: @GUAROCC @  Subjective:  Pain in both feet   History of Present Illness: Donna Dyer is a 58 y.o. female with history of osteoarthritis and DDD.  Patient had ultrasound of bilateral feet on 01/17/2018 that revealed synovitis.  She reports pain in both feet.  She has noticed swelling in both feet and both ankle joints. The pain in her feet occasionally wake her up at night. She denies any hand pain or swelling.  She is experiencing some lower back pain but denies any radiating pain.    Activities of Daily Living:  Patient reports morning stiffness for 0  minutes.   Patient Reports nocturnal pain.  Difficulty dressing/grooming: Denies Difficulty climbing stairs: Reports Difficulty getting out of chair: Denies Difficulty using hands for taps, buttons, cutlery, and/or writing: Denies  Review of Systems  Constitutional: Positive for fatigue.  HENT: Positive for mouth dryness. Negative for mouth sores and nose dryness.   Eyes: Positive for dryness. Negative for pain and visual disturbance.  Respiratory: Negative for cough, hemoptysis, shortness of breath and difficulty breathing.   Cardiovascular: Negative for chest pain, palpitations, hypertension and swelling in legs/feet.  Gastrointestinal: Negative for blood in stool, constipation and diarrhea.  Endocrine: Negative for increased urination.  Genitourinary: Negative for painful urination.  Musculoskeletal: Positive for arthralgias, joint pain and joint swelling. Negative for myalgias, muscle weakness, morning stiffness, muscle tenderness and myalgias.  Skin: Positive for rash (Eczema). Negative for color change, pallor, hair loss, nodules/bumps, skin tightness, ulcers and sensitivity to sunlight.    Allergic/Immunologic: Negative for susceptible to infections.  Neurological: Negative for dizziness, numbness, headaches and weakness.  Hematological: Negative for swollen glands.  Psychiatric/Behavioral: Negative for depressed mood and sleep disturbance. The patient is not nervous/anxious.     PMFS History:  Patient Active Problem List   Diagnosis Date Noted  . DDD (degenerative disc disease), cervical 11/30/2015  . DDD (degenerative disc disease), lumbar 11/30/2015  . Osteoarthritis of both hands 11/30/2015  . Osteoarthritis of both knees 11/30/2015  . Osteoporosis 11/30/2015  . Anemia 11/30/2015  . Bilateral primary osteoarthritis of knee 11/26/2015  . Food intolerance 07/03/2015  . MGUS (monoclonal gammopathy of unknown significance) 06/05/2015  . Other iron deficiency anemias 06/05/2015  . Iron malabsorption 06/05/2015  . Bleeding hemorrhoids 06/05/2015  . Other seasonal allergic rhinitis 11/29/2013  . Venous stasis 11/29/2013  . Cramping of feet 09/10/2013  . Right L4-L5 Lumbosacral facet joint syndrome 02/16/2012  . Hypertriglyceridemia 09/26/2011  . Vaginal intraepithelial neoplasia III (VAIN III) 08/23/2011  . OSA (obstructive sleep apnea) 03/29/2011  . Multinodular goiter 02/28/2011  . Internal hemorrhoids 01/31/2011  . Diabetes mellitus type 2, controlled, without complications ([place]) 75/64/3329  . Essential hypertension, benign 02/23/2010  . Edema 09/23/2009    Past Medical History:  Diagnosis Date  . Anemia   . Asthma 11/30/2015  . Bleeding hemorrhoids 06/05/2015  . DDD (degenerative disc disease), cervical 11/30/2015  . DDD (degenerative disc disease), lumbar 11/30/2015  . Diabetes mellitus   . Edema    LLE- podiatry in [place]  . Endometriosis   . Fibromyalgia 11/30/2015  . Goiter   . Hemorrhoids   .  Hyperlipidemia   . Hypertension   . Iron malabsorption 06/05/2015  . Lumbosacral radiculopathy at S1 07/18/2014   Bilateral  . MGUS (monoclonal gammopathy of  unknown significance) 06/05/2015  . Osteoarthritis of both hands 11/30/2015  . Osteoarthritis of both knees 11/30/2015  . Osteopenia   . Osteoporosis 11/30/2015  . Other iron deficiency anemias 06/05/2015  . Pain, joint, hip, right    chronic- Dr Alvan Dame Dr Nelva Bush  . Prediabetes   . Pseudogout 11/30/2015  . Sleep apnea     Family History  Problem Relation Age of Onset  . Rheum arthritis Mother   . Hypertension Mother   . Cancer Father        bladder  . Heart attack Father        MI at age 20  . Diabetes Brother   . Diabetes Brother   . Diabetes Brother   . Diabetes Brother   . Sarcoidosis Brother   . Sarcoidosis Sister   . Diabetes Sister   . Polycystic ovary syndrome Daughter   . Hypertension Daughter   . Anesthesia problems Neg Hx   . Hypotension Neg Hx   . Malignant hyperthermia Neg Hx   . Pseudochol deficiency Neg Hx    Past Surgical History:  Procedure Laterality Date  . Back Injections    . ESOPHAGOGASTRODUODENOSCOPY  7-11   for anemia  . FLEXIBLE SIGMOIDOSCOPY  01/31/2011   Procedure: FLEXIBLE SIGMOIDOSCOPY;  Surgeon: Missy Sabins, MD;  Location: [facility];  Service: Endoscopy;  Laterality: N/A;  . TOTAL ABDOMINAL HYSTERECTOMY  1995   bilat oophorectomy/ endometriosis  . VULVAR LESION REMOVAL  09/07/2011   Procedure: VULVAR LESION;  Surgeon: Alvino Chapel, MD;  Location: [facility];  Service: Gynecology;  Laterality: N/A;  vaginal lesion   Social History   Social History Narrative   Lives in [place] with husband and daughter. Ambulatory, no cane or walker. Works at [employer].    [employer].   Right handed.   Caffeine none.   Immunization History  Administered Date(s) Administered  . Influenza,inj,Quad PF,6+ Mos 11/06/2012, 10/20/2014  . Influenza-Unspecified 10/23/2012, 10/22/2013, 10/11/2015, 10/13/2016, 10/18/2017  . PPD Test 03/15/2012  . Pneumococcal Polysaccharide-23 09/27/2010, 03/10/2011  . Tdap  01/16/2014     Objective: Vital Signs: BP 112/60 (BP Location: Left Arm, Patient Position: Sitting, Cuff Size: Normal)   Pulse 62   Resp 13   Ht 5\' 6"  (1.676 m)   Wt 226 lb (102.5 kg)   BMI 36.48 kg/m    Physical Exam Vitals signs and nursing note reviewed.  Constitutional:      Appearance: She is well-developed.  HENT:     Head: Normocephalic and atraumatic.  Eyes:     Conjunctiva/sclera: Conjunctivae normal.  Neck:     Musculoskeletal: Normal range of motion.  Cardiovascular:     Rate and Rhythm: Normal rate and regular rhythm.     Heart sounds: Normal heart sounds.  Pulmonary:     Effort: Pulmonary effort is normal.     Breath sounds: Normal breath sounds.  Abdominal:     General: Bowel sounds are normal.     Palpations: Abdomen is soft.  Lymphadenopathy:     Cervical: No cervical adenopathy.  Skin:    General: Skin is warm and dry.     Capillary Refill: Capillary refill takes less than 2 seconds.  Neurological:     Mental Status: She is alert and oriented to person, place, and time.  Psychiatric:        Behavior: Behavior normal.      Musculoskeletal Exam: C-spine, thoracic spine, lumbar spine good range of motion.  No midline spinal tenderness.  No SI joint tenderness.  Shoulder joints, elbow joints, wrist joints, MCPs, PIPs, DIPs good range of motion with no synovitis.  She has complete fist formation bilaterally.  Hip joints, knee joints, ankle joints, MTPs, PIPs, DIPs good range of motion.  No warmth or effusion of bilateral knee joints.  No tenderness or swelling of ankle joints.  She has tenderness of all MTP and PIP joints in her feet.  No tenderness over trochanteric bursa bilaterally.  CDAI Exam: CDAI Score: Not documented Patient Global Assessment: Not documented; Provider Global Assessment: Not documented Swollen: 0 ; Tender: 20  Joint Exam      Right  Left  MTP 1   Tender   Tender  MTP 2   Tender   Tender  MTP 3   Tender   Tender  MTP 4   Tender    Tender  MTP 5   Tender   Tender  PIP 1 (toe)   Tender   Tender  PIP 2 (toe)   Tender   Tender  PIP 3 (toe)   Tender   Tender  PIP 4 (toe)   Tender   Tender  PIP 5 (toe)   Tender   Tender     Investigation: No additional findings. Component     Latest Ref Rng & Units 0/09/9831  Cyclic Citrullin Peptide Ab     UNITS <16  RA Latex Turbid.     <14 IU/mL <14  Sed Rate     0 - 30 mm/h 75 (H)  Uric Acid, Serum     2.5 - 7.0 mg/dL 7.5 (H)  Angiotensin-Converting Enzyme     9 - 67 U/L 17   Imaging: [place] Extrem [place]  Result Date: 01/17/2018 Ultrasound examination of bilateral feet was performed per EULAR recommendations. Using 12 MHz transducer, grayscale and power Doppler bilateral  First, second, fourth, and fifth MTP joints and bilateral tibiotalar both dorsal and anterior aspects were evaluated to look for synovitis or tenosynovitis. The findings were there was  synovitis in the left first MTP bilateral second fourth and fifth MTP joints.  No synovitis was noted in tibiotalar joints on ultrasound examination. Impression: Ultrasound examination was consistent with inflammatory arthritis.  Xr Foot 2 Views Left  Result Date: 01/16/2018 First MTP, PIP and DIP narrowing was noted.  Dorsal spurring and small calcaneal spur was noted.  No juxta-articular osteopenia was noted.  No erosive changes were noted. Impression: These findings are consistent with osteoarthritis of the foot  Xr Foot 2 Views Right  Result Date: 01/16/2018 First MTP, PIP and DIP narrowing was noted.  Dorsal spurring and small calcaneal spur was noted.  No juxta-articular osteopenia was noted.  No erosive changes were noted. Impression: These findings are consistent with osteoarthritis of the foot.   Recent Labs: Lab Results  Component Value Date   WBC 7.2 10/20/2017   HGB 10.1 (L) 10/20/2017   PLT 273 10/20/2017   NA 137 10/20/2017   K 4.3 10/20/2017   CL 102 10/20/2017   CO2 25 10/20/2017   GLUCOSE 107  (H) 10/20/2017   BUN 20 10/20/2017   CREATININE 1.24 (H) 10/20/2017   BILITOT 0.7 10/20/2017   ALKPHOS 118 10/20/2017   AST 20 10/20/2017   ALT 19 10/20/2017   PROT 8.5 (  H) 10/20/2017   ALBUMIN 3.7 10/20/2017   CALCIUM 9.4 10/20/2017   GFRAA 54 (L) 10/20/2017    Speciality Comments: No specialty comments available.  Procedures:  No procedures performed Allergies: Sulfamethoxazole; Pineapple; Sulfasalazine; Avapro [irbesartan]; Onglyza [saxagliptin]; Spironolactone; Valsartan; Amlodipine besylate; Amlodipine besylate; Asparagus; Azithromycin; Chlorthalidone; Codeine; Dulaglutide; Hydrochlorothiazide; Iodinated diagnostic agents; Metformin; Metformin and related; Other; Sulfa antibiotics; and Sulfonamide derivatives   Assessment / Plan:     Visit Diagnoses: Inflammatory arthritis - RF-, CCP-, 14-3-3 eta negative. ACE WNL. Sed rate elevated.01/17/2018: ultrasound of both feet +synovitis: She has no obvious synovitis on exam.  She has tenderness of all MTP and PIP joints and bilateral feet.  She had ultrasound performed on 01/17/2018 of bilateral feet which revealed findings consistent with inflammatory arthritis.  We discussed her recent lab work as well as the diagnosis of inflammatory arthritis.  We discussed the indications, contraindications, potential side effects of Plaquenil.  All questions were addressed and consent was obtained today.  She has an allergy to sulfa and she was advised to take half tablet of Plaquenil for her first dose and to have Benadryl on hand.  She will take Plaquenil 200 mg 1 tablet twice daily.  She was advised to notify us if she does any side effects or allergic reaction.  She was given a Plaquenil eye exam form today in the office to take with her for her baseline eye exam.  She will return for lab work in 1 month and then every 5 months.  She is advised to notify us that shows increased joint pain or joint swelling.  She will follow-up in the office in 3  months.  Patient was counseled on the purpose, proper use, and adverse effects of hydroxychloroquine including nausea/diarrhea, skin rash, headaches, and sun sensitivity.  Discussed importance of annual eye exams while on hydroxychloroquine to monitor to ocular toxicity and discussed importance of frequent laboratory monitoring.  Provided patient with eye exam form for baseline ophthalmologic exam.  Provided patient with educational materials on hydroxychloroquine and answered all questions.  Patient consented to hydroxychloroquine.  Will upload consent in the media tab.    Dose will be Plaquenil 200 mg twice daily.  Prescription pending lab results.  High risk medication use - PLQ 200 mg BID.  She was advised to get a Plaquenil baseline eye exam.  She will return for lab work in 1 month and then every 5 months.  Standing orders for CBC and CMP were placed today.  Primary osteoarthritis of both feet: She has PIP and DIP synovial thickening consistent with osteoarthritis of bilateral feet.  She is having discomfort in both feet.  She has tenderness of all MTP and PIP joints.  Primary osteoarthritis of both hands: She has very minimal PIP and DIP synovial thickening consistent with osteoarthritis of bilateral hands.  She has no synovitis or tenderness on exam.  She has complete fist formation bilaterally.  Joint protection and muscle strengthening were discussed.  Primary osteoarthritis of both knees: No warmth or effusion.  Good range of motion with no discomfort.  DDD (degenerative disc disease), cervical: She is good range of motion with no discomfort at this time.  She has no symptoms of radiculopathy at this time  DDD (degenerative disc disease), lumbar: She has intermittent lower back pain.  She has no symptoms of radiculopathy or sciatica at this time.  Other medical conditions are listed as follows:  MGUS (monoclonal gammopathy of unknown significance)  Vaginal intraepithelial neoplasia  III (  VAIN III)  History of anemia  Multinodular goiter  History of sleep apnea  History of hypertension  History of diabetes mellitus   Orders: Orders Placed This Encounter  Procedures  . CBC with Differential/Platelet  . COMPLETE METABOLIC PANEL WITH GFR   No orders of the defined types were placed in this encounter.   Face-to-face time spent with patient was 30  minutes. Greater than 50% of time was spent in counseling and coordination of care.  Follow-Up Instructions: Return in about 3 months (around 04/25/2018) for Osteoarthritis, DDD.   Ofilia Neas, PA-C  Note - This record has been created using Dragon software.  Chart creation errors have been sought, but may not always  have been located. Such creation errors do not reflect on  the standard of medical care.

## 2018-01-21 LAB — 14-3-3 ETA PROTEIN

## 2018-01-21 LAB — URIC ACID: Uric Acid, Serum: 7.5 mg/dL — ABNORMAL HIGH (ref 2.5–7.0)

## 2018-01-21 LAB — CYCLIC CITRUL PEPTIDE ANTIBODY, IGG: Cyclic Citrullin Peptide Ab: <16 UNITS

## 2018-01-21 LAB — SEDIMENTATION RATE: SED RATE: 75 mm/h — AB (ref 0–30)

## 2018-01-21 LAB — ANGIOTENSIN CONVERTING ENZYME: Angiotensin-Converting Enzyme: 17 U/L (ref 9–67)

## 2018-01-21 LAB — RHEUMATOID FACTOR: Rhuematoid fact SerPl-aCnc: <14 IU/mL (ref <14)

## 2018-01-22 ENCOUNTER — Telehealth: Payer: Self-pay | Admitting: Rheumatology

## 2018-01-22 NOTE — Telephone Encounter (Signed)
Patient left a voicemail stating she was returning your call.   

## 2018-01-22 NOTE — Progress Notes (Signed)
RF-, CCP-, 14-3-3 eta negative. ACE WNL. Sed rate is elevated.  She had synovitis on ultrasound of both feet.

## 2018-01-23 ENCOUNTER — Telehealth: Payer: Self-pay | Admitting: Rheumatology

## 2018-01-23 NOTE — Telephone Encounter (Signed)
Attempted to contact patient and left message for patient to call the office.  

## 2018-01-23 NOTE — Telephone Encounter (Signed)
Patient called stating she was returning your call.   °

## 2018-01-23 NOTE — Telephone Encounter (Signed)
Patient advised RF-, CCP-, 14-3-3 eta negative. ACE WNL. Sed rate is elevated. She had synovitis on ultrasound of both feet. Patient verbalized understanding.

## 2018-01-24 ENCOUNTER — Encounter: Payer: Self-pay | Admitting: Physician Assistant

## 2018-01-24 ENCOUNTER — Ambulatory Visit: Payer: BLUE CROSS/BLUE SHIELD | Admitting: Physician Assistant

## 2018-01-24 VITALS — BP 112/60 | HR 62 | Resp 13 | Ht 66.0 in | Wt 226.0 lb

## 2018-01-24 DIAGNOSIS — M5030 Other cervical disc degeneration, unspecified cervical region: Secondary | ICD-10-CM

## 2018-01-24 DIAGNOSIS — M19071 Primary osteoarthritis, right ankle and foot: Secondary | ICD-10-CM | POA: Diagnosis not present

## 2018-01-24 DIAGNOSIS — M5136 Other intervertebral disc degeneration, lumbar region: Secondary | ICD-10-CM

## 2018-01-24 DIAGNOSIS — M19041 Primary osteoarthritis, right hand: Secondary | ICD-10-CM

## 2018-01-24 DIAGNOSIS — Z8679 Personal history of other diseases of the circulatory system: Secondary | ICD-10-CM

## 2018-01-24 DIAGNOSIS — M1990 Unspecified osteoarthritis, unspecified site: Secondary | ICD-10-CM | POA: Diagnosis not present

## 2018-01-24 DIAGNOSIS — M170 Bilateral primary osteoarthritis of knee: Secondary | ICD-10-CM

## 2018-01-24 DIAGNOSIS — Z79899 Other long term (current) drug therapy: Secondary | ICD-10-CM

## 2018-01-24 DIAGNOSIS — D072 Carcinoma in situ of vagina: Secondary | ICD-10-CM

## 2018-01-24 DIAGNOSIS — Z862 Personal history of diseases of the blood and blood-forming organs and certain disorders involving the immune mechanism: Secondary | ICD-10-CM

## 2018-01-24 DIAGNOSIS — Z8669 Personal history of other diseases of the nervous system and sense organs: Secondary | ICD-10-CM

## 2018-01-24 DIAGNOSIS — Z8639 Personal history of other endocrine, nutritional and metabolic disease: Secondary | ICD-10-CM

## 2018-01-24 DIAGNOSIS — E042 Nontoxic multinodular goiter: Secondary | ICD-10-CM

## 2018-01-24 DIAGNOSIS — D472 Monoclonal gammopathy: Secondary | ICD-10-CM

## 2018-01-24 DIAGNOSIS — M19042 Primary osteoarthritis, left hand: Secondary | ICD-10-CM

## 2018-01-24 DIAGNOSIS — M19072 Primary osteoarthritis, left ankle and foot: Secondary | ICD-10-CM

## 2018-01-24 MED ORDER — HYDROXYCHLOROQUINE SULFATE 200 MG PO TABS: 200.0000 mg | ORAL_TABLET | Freq: Two times a day (BID) | ORAL | 0 refills | Status: DC

## 2018-01-24 NOTE — Patient Instructions (Signed)
Plaquenil Dosing For first dose please take 1/2 tablet to see if you tolerate and have Benadryl on hand in case of an allergic reaction.  Please call office if you develop signs/symptoms of allergic reaction/rash and urgent medical care if needed.  Standing Labs We placed an order today for your standing lab work.    Please come back and get your standing labs in 1 month, then 3 months,  We have open lab Monday through Friday from 8:30-11:30 AM and 1:30-4:00 PM  at the office of Dr. Bo Merino.   You may experience shorter wait times on Monday and Friday afternoons. The office is located at [address] [place], [place], [place] [address] No appointment is necessary.   Labs are drawn by [facility].  You may receive a bill from [facility] for your lab work.  If you wish to have your labs drawn at another location, please call the office 24 hours in advance to send orders.  If you have any questions regarding directions or hours of operation,  please call [phone].   Just as a reminder please drink plenty of water prior to coming for your lab work. Thanks!  Vaccines You are taking a medication(s) that can suppress your immune system.  The following immunizations are recommended: . Flu annually . Pneumonia (Pneumovax 23 and Prevnar 13 spaced at least 1 year apart) . Shingrix  Please check with your PCP to make sure you are up to date.   Hydroxychloroquine tablets What is this medicine? HYDROXYCHLOROQUINE (hye drox ee KLOR oh kwin) is used to treat rheumatoid arthritis and systemic lupus erythematosus. It is also used to treat malaria. This medicine may be used for other purposes; ask your health care provider or pharmacist if you have questions. COMMON BRAND NAME(S): Plaquenil, Quineprox What should I tell my health care provider before I take this medicine? They need to know if you have any of these conditions: -diabetes -eye disease, vision problems -G6PD  deficiency -history of blood diseases -history of irregular heartbeat -if you often drink alcohol -kidney disease -liver disease -porphyria -psoriasis -seizures -an unusual or allergic reaction to chloroquine, hydroxychloroquine, other medicines, foods, dyes, or preservatives -pregnant or trying to get pregnant -breast-feeding How should I use this medicine? Take this medicine by mouth with a glass of water. Follow the directions on the prescription label. Avoid taking antacids within 4 hours of taking this medicine. It is best to separate these medicines by at least 4 hours. Do not cut, crush or chew this medicine. You can take it with or without food. If it upsets your stomach, take it with food. Take your medicine at regular intervals. Do not take your medicine more often than directed. Take all of your medicine as directed even if you think you are better. Do not skip doses or stop your medicine early. Talk to your pediatrician regarding the use of this medicine in children. While this drug may be prescribed for selected conditions, precautions do apply. Overdosage: If you think you have taken too much of this medicine contact a poison control center or emergency room at once. NOTE: This medicine is only for you. Do not share this medicine with others. What if I miss a dose? If you miss a dose, take it as soon as you can. If it is almost time for your next dose, take only that dose. Do not take double or extra doses. What may interact with this medicine? Do not take this medicine with any  of the following medications: -cisapride -dofetilide -dronedarone -live virus vaccines -penicillamine -pimozide -thioridazine -ziprasidone This medicine may also interact with the following medications: -ampicillin -antacids -cimetidine -cyclosporine -digoxin -medicines for diabetes, like insulin, glipizide, glyburide -medicines for seizures like carbamazepine, phenobarbital,  phenytoin -mefloquine -methotrexate -other medicines that prolong the QT interval (cause an abnormal heart rhythm) -praziquantel This list may not describe all possible interactions. Give your health care provider a list of all the medicines, herbs, non-prescription drugs, or dietary supplements you use. Also tell them if you smoke, drink alcohol, or use illegal drugs. Some items may interact with your medicine. What should I watch for while using this medicine? Tell your doctor or healthcare professional if your symptoms do not start to get better or if they get worse. Avoid taking antacids within 4 hours of taking this medicine. It is best to separate these medicines by at least 4 hours. Tell your doctor or health care professional right away if you have any change in your eyesight. Your vision and blood may be tested before and during use of this medicine. This medicine can make you more sensitive to the sun. Keep out of the sun. If you cannot avoid being in the sun, wear protective clothing and use sunscreen. Do not use sun lamps or tanning beds/booths. What side effects may I notice from receiving this medicine? Side effects that you should report to your doctor or health care professional as soon as possible: -allergic reactions like skin rash, itching or hives, swelling of the face, lips, or tongue -changes in vision -decreased hearing or ringing of the ears -redness, blistering, peeling or loosening of the skin, including inside the mouth -seizures -sensitivity to light -signs and symptoms of a dangerous change in heartbeat or heart rhythm like chest pain; dizziness; fast or irregular heartbeat; palpitations; feeling faint or lightheaded, falls; breathing problems -signs and symptoms of liver injury like dark yellow or brown urine; general ill feeling or flu-like symptoms; light-colored stools; loss of appetite; nausea; right upper belly pain; unusually weak or tired; yellowing of the  eyes or skin -signs and symptoms of low blood sugar such as feeling anxious; confusion; dizziness; increased hunger; unusually weak or tired; sweating; shakiness; cold; irritable; headache; blurred vision; fast heartbeat; loss of consciousness -uncontrollable head, mouth, neck, arm, or leg movements Side effects that usually do not require medical attention (report to your doctor or health care professional if they continue or are bothersome): -anxious -diarrhea -dizziness -hair loss -headache -irritable -loss of appetite -nausea, vomiting -stomach pain This list may not describe all possible side effects. Call your doctor for medical advice about side effects. You may report side effects to FDA at 1-800-FDA-1088. Where should I keep my medicine? Keep out of the reach of children. In children, this medicine can cause overdose with small doses. Store at room temperature between 15 and 30 degrees C (59 and 86 degrees F). Protect from moisture and light. Throw away any unused medicine after the expiration date. NOTE: This sheet is a summary. It may not cover all possible information. If you have questions about this medicine, talk to your doctor, pharmacist, or health care provider.  2019 Elsevier/Gold Standard (2015-08-12 14:16:15)

## 2018-01-24 NOTE — Progress Notes (Signed)
Pharmacy Note  Subjective: Patient presents today to the [facility] to see Dr. Estanislado Pandy.  Patient seen by the pharmacist for counseling on hydroxychloroquine inflammatory arthritis.  Patient is naive to therapy.  Objective: CMP     Component Value Date/Time   NA 137 10/20/2017 0751   NA 137 05/26/2017 0910   NA 142 12/16/2016 0802   NA 136 06/10/2016 0747   K 4.3 10/20/2017 0751   K 3.4 12/16/2016 0802   K 4.0 06/10/2016 0747   CL 102 10/20/2017 0751   CL 102 12/16/2016 0802   CO2 25 10/20/2017 0751   CO2 27 12/16/2016 0802   CO2 26 06/10/2016 0747   GLUCOSE 107 (H) 10/20/2017 0751   GLUCOSE 113 12/16/2016 0802   BUN 20 10/20/2017 0751   BUN 12 05/26/2017 0910   BUN 15 12/16/2016 0802   BUN 22.4 06/10/2016 0747   CREATININE 1.24 (H) 10/20/2017 0751   CREATININE 1.2 12/16/2016 0802   CREATININE 1.2 (H) 06/10/2016 0747   CALCIUM 9.4 10/20/2017 0751   CALCIUM 9.1 12/16/2016 0802   CALCIUM 9.6 06/10/2016 0747   PROT 8.5 (H) 10/20/2017 0751   PROT 8.4 (H) 12/16/2016 0802   PROT 7.6 12/16/2016 0802   PROT 8.8 (H) 06/10/2016 0747   ALBUMIN 3.7 10/20/2017 0751   ALBUMIN 3.6 12/16/2016 0802   ALBUMIN 3.9 06/10/2016 0747   AST 20 10/20/2017 0751   AST 20 06/10/2016 0747   ALT 19 10/20/2017 0751   ALT 23 12/16/2016 0802   ALT 17 06/10/2016 0747   ALKPHOS 118 10/20/2017 0751   ALKPHOS 119 (H) 12/16/2016 0802   ALKPHOS 122 06/10/2016 0747   BILITOT 0.7 10/20/2017 0751   BILITOT 0.61 06/10/2016 0747   GFRNONAA 47 (L) 10/20/2017 0751   GFRNONAA 72 11/29/2013 1150   GFRAA 54 (L) 10/20/2017 0751   GFRAA 83 11/29/2013 1150    CBC    Component Value Date/Time   WBC 7.2 10/20/2017 0751   WBC 6.1 04/11/2014 0745   RBC 3.67 (L) 10/20/2017 0751   HGB 10.1 (L) 10/20/2017 0751   HGB 10.8 (L) 12/16/2016 0802   HCT 32.1 (L) 10/20/2017 0751   HCT 33.1 (L) 12/16/2016 0802   PLT 273 10/20/2017 0751   PLT 300 12/16/2016 0802   MCV 87.5 10/20/2017 0751   MCV 85  12/16/2016 0802   MCH 27.5 10/20/2017 0751   MCHC 31.5 10/20/2017 0751   RDW 12.8 10/20/2017 0751   RDW 13.5 12/16/2016 0802   LYMPHSABS 2.4 10/20/2017 0751   LYMPHSABS 2.1 12/16/2016 0802   MONOABS 0.6 10/20/2017 0751   EOSABS 0.2 10/20/2017 0751   EOSABS 0.1 12/16/2016 0802   BASOSABS 0.0 10/20/2017 0751   BASOSABS 0.0 12/16/2016 0802    Assessment/Plan: Patient was counseled on the purpose, proper use, and adverse effects of hydroxychloroquine including nausea/diarrhea, skin rash, headaches, and sun sensitivity.  Discussed importance of annual eye exams while on hydroxychloroquine to monitor to ocular toxicity and discussed importance of frequent laboratory monitoring. She is to come back in 1 month, then 3 months, then every 5 months.  She prefers to get labs closer to home in [place].  Reviewed standing orders. Provided patient with eye exam form for baseline ophthalmologic exam and standing lab instructions.  Provided patient with educational materials on hydroxychloroquine and answered all questions.  Patient consented to hydroxychloroquine.  Will upload consent in the media tab.     Dose will be Plaquenil 200 mg twice daily.  Prescription sent to local pharmacy. She has an allergy to sulfa drugs.  Instructed patient to take 1/2 half tablet for first dose and have Benadryl on hand in case of allergic reaction and notify our office.  All questions encouraged and answered.  Instructed patient to call with any further questions or concerns.  Mariella Saa, PharmD, [facility] Rheumatology Clinical Pharmacist  01/24/2018 2:41 PM

## 2018-01-31 ENCOUNTER — Encounter: Payer: Self-pay | Admitting: Rheumatology

## 2018-01-31 DIAGNOSIS — H0011 Chalazion right upper eyelid: Secondary | ICD-10-CM | POA: Diagnosis not present

## 2018-01-31 DIAGNOSIS — H40013 Open angle with borderline findings, low risk, bilateral: Secondary | ICD-10-CM | POA: Diagnosis not present

## 2018-01-31 DIAGNOSIS — H04123 Dry eye syndrome of bilateral lacrimal glands: Secondary | ICD-10-CM | POA: Diagnosis not present

## 2018-01-31 DIAGNOSIS — H2513 Age-related nuclear cataract, bilateral: Secondary | ICD-10-CM | POA: Diagnosis not present

## 2018-01-31 LAB — HM DIABETES EYE EXAM

## 2018-02-02 DIAGNOSIS — Z01419 Encounter for gynecological examination (general) (routine) without abnormal findings: Secondary | ICD-10-CM | POA: Diagnosis not present

## 2018-02-02 DIAGNOSIS — Z6835 Body mass index (BMI) 35.0-35.9, adult: Secondary | ICD-10-CM | POA: Diagnosis not present

## 2018-03-22 ENCOUNTER — Encounter: Payer: Self-pay | Admitting: Rheumatology

## 2018-03-26 DIAGNOSIS — E042 Nontoxic multinodular goiter: Secondary | ICD-10-CM | POA: Diagnosis not present

## 2018-03-26 DIAGNOSIS — E041 Nontoxic single thyroid nodule: Secondary | ICD-10-CM | POA: Diagnosis not present

## 2018-03-30 ENCOUNTER — Other Ambulatory Visit: Payer: Self-pay

## 2018-03-30 DIAGNOSIS — M1990 Unspecified osteoarthritis, unspecified site: Secondary | ICD-10-CM

## 2018-03-30 MED ORDER — HYDROXYCHLOROQUINE SULFATE 200 MG PO TABS: 200.0000 mg | ORAL_TABLET | Freq: Two times a day (BID) | ORAL | 0 refills | Status: DC

## 2018-03-30 NOTE — Progress Notes (Signed)
Okay to refill PLQ per Dr. Estanislado Pandy. Patient has been notified.

## 2018-04-03 MED ORDER — NEBIVOLOL HCL 20 MG PO TABS: 20.0000 mg | ORAL_TABLET | Freq: Every day | ORAL | 3 refills | Status: DC

## 2018-04-18 ENCOUNTER — Other Ambulatory Visit: Payer: Self-pay | Admitting: Cardiology

## 2018-04-18 NOTE — Telephone Encounter (Signed)
Verapamil refilled

## 2018-04-25 ENCOUNTER — Ambulatory Visit: Payer: BLUE CROSS/BLUE SHIELD | Admitting: Physician Assistant

## 2018-05-04 DIAGNOSIS — F4323 Adjustment disorder with mixed anxiety and depressed mood: Secondary | ICD-10-CM | POA: Diagnosis not present

## 2018-05-11 DIAGNOSIS — F4323 Adjustment disorder with mixed anxiety and depressed mood: Secondary | ICD-10-CM | POA: Diagnosis not present

## 2018-05-18 DIAGNOSIS — F4323 Adjustment disorder with mixed anxiety and depressed mood: Secondary | ICD-10-CM | POA: Diagnosis not present

## 2018-05-24 DIAGNOSIS — F4323 Adjustment disorder with mixed anxiety and depressed mood: Secondary | ICD-10-CM | POA: Diagnosis not present

## 2018-06-04 ENCOUNTER — Other Ambulatory Visit: Payer: Self-pay | Admitting: Cardiology

## 2018-07-04 NOTE — Progress Notes (Signed)
Office Visit Note  Patient: Donna Dyer             Date of Birth: [birth year]           MRN: [id number]             PCP: Hali Marry, MD Referring: Hali Marry, * Visit Date: 07/17/2018 Occupation: @GUAROCC @  Subjective:  Pain in both feet.    History of Present Illness: Donna Dyer is a 58 y.o. female with history of inflammatory arthritis, osteoarthritis and DDD.  Donna Dyer did not start Plaquenil at last visit due to concern for eye toxicity.  Donna Dyer went to her opthalmologist for baseline eye exam and given clearance to start but patient still apprehensive.  Donna Dyer continues to have pain and intermittent swelling in bilateral toes.  Donna Dyer has some shoulder discomfort which Donna Dyer contributes to a previous injury for which Donna Dyer went to physical therapy.  Donna Dyer continues to have dry eyes.   Activities of Daily Living:  Patient reports morning stiffness for 0 minute.   Patient Denies nocturnal pain.  Difficulty dressing/grooming: Denies Difficulty climbing stairs: Denies Difficulty getting out of chair: Denies Difficulty using hands for taps, buttons, cutlery, and/or writing: Denies  Review of Systems  Constitutional: Positive for fatigue and weight loss (intentional). Negative for night sweats and weight gain.  HENT: Positive for mouth dryness. Negative for mouth sores, trouble swallowing, trouble swallowing and nose dryness.   Eyes: Positive for dryness. Negative for double vision, pain, redness and visual disturbance.  Respiratory: Negative for cough, shortness of breath and difficulty breathing.   Cardiovascular: Negative for chest pain, palpitations, hypertension, irregular heartbeat and swelling in legs/feet.  Gastrointestinal: Positive for constipation and diarrhea. Negative for abdominal pain and blood in stool.  Endocrine: Negative for increased urination.  Genitourinary: Negative for vaginal dryness.  Musculoskeletal: Positive for arthralgias, joint pain and  joint swelling. Negative for myalgias, muscle weakness, morning stiffness, muscle tenderness and myalgias.  Skin: Negative for color change, rash, hair loss, redness, skin tightness, ulcers and sensitivity to sunlight.  Allergic/Immunologic: Negative for susceptible to infections.  Neurological: Negative for dizziness, headaches, memory loss, night sweats and weakness.  Hematological: Negative for swollen glands.  Psychiatric/Behavioral: Negative for depressed mood and sleep disturbance. The patient is not nervous/anxious.     PMFS History:  Patient Active Problem List   Diagnosis Date Noted  . DDD (degenerative disc disease), cervical 11/30/2015  . DDD (degenerative disc disease), lumbar 11/30/2015  . Osteoarthritis of both hands 11/30/2015  . Osteoarthritis of both knees 11/30/2015  . Osteoporosis 11/30/2015  . Anemia 11/30/2015  . Bilateral primary osteoarthritis of knee 11/26/2015  . Food intolerance 07/03/2015  . MGUS (monoclonal gammopathy of unknown significance) 06/05/2015  . Other iron deficiency anemias 06/05/2015  . Iron malabsorption 06/05/2015  . Bleeding hemorrhoids 06/05/2015  . Other seasonal allergic rhinitis 11/29/2013  . Venous stasis 11/29/2013  . Cramping of feet 09/10/2013  . Right L4-L5 Lumbosacral facet joint syndrome 02/16/2012  . Hypertriglyceridemia 09/26/2011  . Vaginal intraepithelial neoplasia III (VAIN III) 08/23/2011  . OSA (obstructive sleep apnea) 03/29/2011  . Multinodular goiter 02/28/2011  . Internal hemorrhoids 01/31/2011  . Diabetes mellitus type 2, controlled, without complications ([place]) 64/40/3474  . Essential hypertension, benign 02/23/2010  . Edema 09/23/2009    Past Medical History:  Diagnosis Date  . Anemia   . Asthma 11/30/2015  . Bleeding hemorrhoids 06/05/2015  . DDD (degenerative disc disease), cervical 11/30/2015  . DDD (degenerative disc  disease), lumbar 11/30/2015  . Diabetes mellitus   . Edema    LLE- podiatry in [place]  .  Endometriosis   . Fibromyalgia 11/30/2015  . Goiter   . Hemorrhoids   . Hyperlipidemia   . Hypertension   . Iron malabsorption 06/05/2015  . Lumbosacral radiculopathy at S1 07/18/2014   Bilateral  . MGUS (monoclonal gammopathy of unknown significance) 06/05/2015  . Osteoarthritis of both hands 11/30/2015  . Osteoarthritis of both knees 11/30/2015  . Osteopenia   . Osteoporosis 11/30/2015  . Other iron deficiency anemias 06/05/2015  . Pain, joint, hip, right    chronic- Dr Alvan Dame Dr Nelva Bush  . Prediabetes   . Pseudogout 11/30/2015  . Sleep apnea     Family History  Problem Relation Age of Onset  . Rheum arthritis Mother   . Hypertension Mother   . Cancer Father        bladder  . Heart attack Father        MI at age 62  . Diabetes Brother   . Diabetes Brother   . Diabetes Brother   . Diabetes Brother   . Sarcoidosis Brother   . Sarcoidosis Sister   . Diabetes Sister   . Polycystic ovary syndrome Daughter   . Hypertension Daughter   . Anesthesia problems Neg Hx   . Hypotension Neg Hx   . Malignant hyperthermia Neg Hx   . Pseudochol deficiency Neg Hx    Past Surgical History:  Procedure Laterality Date  . Back Injections    . ESOPHAGOGASTRODUODENOSCOPY  7-11   for anemia  . FLEXIBLE SIGMOIDOSCOPY  01/31/2011   Procedure: FLEXIBLE SIGMOIDOSCOPY;  Surgeon: Missy Sabins, MD;  Location: [facility];  Service: Endoscopy;  Laterality: N/A;  . TOTAL ABDOMINAL HYSTERECTOMY  1995   bilat oophorectomy/ endometriosis  . VULVAR LESION REMOVAL  09/07/2011   Procedure: VULVAR LESION;  Surgeon: Alvino Chapel, MD;  Location: [facility];  Service: Gynecology;  Laterality: N/A;  vaginal lesion   Social History   Social History Narrative   Lives in [place] with husband and daughter. Ambulatory, no cane or walker. Works at [employer].    [employer].   Right handed.   Caffeine none.   Immunization History  Administered Date(s) Administered   . Influenza,inj,Quad PF,6+ Mos 11/06/2012, 10/20/2014  . Influenza-Unspecified 10/23/2012, 10/22/2013, 10/11/2015, 10/13/2016, 10/18/2017  . PPD Test 03/15/2012  . Pneumococcal Polysaccharide-23 09/27/2010, 03/10/2011  . Tdap 01/16/2014     Objective: Vital Signs: BP 128/76 (BP Location: Left Wrist, Patient Position: Sitting, Cuff Size: Normal)   Pulse 60   Resp 12   Ht 5\' 6"  (1.676 m)   Wt 223 lb (101.2 kg)   BMI 35.99 kg/m    Physical Exam Vitals signs and nursing note reviewed.  Constitutional:      Appearance: Donna Dyer is well-developed.  HENT:     Head: Normocephalic and atraumatic.  Eyes:     Conjunctiva/sclera: Conjunctivae normal.  Neck:     Musculoskeletal: Normal range of motion.  Cardiovascular:     Rate and Rhythm: Normal rate and regular rhythm.     Heart sounds: Normal heart sounds.  Pulmonary:     Effort: Pulmonary effort is normal.     Breath sounds: Normal breath sounds.  Abdominal:     General: Bowel sounds are normal.     Palpations: Abdomen is soft.  Lymphadenopathy:     Cervical: No cervical adenopathy.  Skin:    General:  Skin is warm and dry.     Capillary Refill: Capillary refill takes less than 2 seconds.  Neurological:     Mental Status: Donna Dyer is alert and oriented to person, place, and time.  Psychiatric:        Behavior: Behavior normal.      Musculoskeletal Exam: C-spine thoracic and lumbar spine good range of motion.  Shoulder joints elbow joints wrist joint MCPs PIPs DIPs with good range of motion with no synovitis.  Hip joints knee joints ankles MTPs with good range of motion with no synovitis.  Donna Dyer has some thickening of the DIP joints with no synovitis.  CDAI Exam: CDAI Score: - Patient Global: -; Provider Global: - Swollen: -; Tender: - Joint Exam   No joint exam has been documented for this visit   There is currently no information documented on the homunculus. Go to the Rheumatology activity and complete the homunculus joint exam.   Investigation: No additional findings.  Imaging: No results found.  Recent Labs: Lab Results  Component Value Date   WBC 7.2 10/20/2017   HGB 10.1 (L) 10/20/2017   PLT 273 10/20/2017   NA 137 10/20/2017   K 4.3 10/20/2017   CL 102 10/20/2017   CO2 25 10/20/2017   GLUCOSE 107 (H) 10/20/2017   BUN 20 10/20/2017   CREATININE 1.24 (H) 10/20/2017   BILITOT 0.7 10/20/2017   ALKPHOS 118 10/20/2017   AST 20 10/20/2017   ALT 19 10/20/2017   PROT 8.5 (H) 10/20/2017   ALBUMIN 3.7 10/20/2017   CALCIUM 9.4 10/20/2017   GFRAA 54 (L) 10/20/2017    Speciality Comments: PLQ Eye Exam: 01/31/18 WNL @ [facility] Follow up in 1 year.  Procedures:  No procedures performed Allergies: Sulfamethoxazole, Pineapple, Sulfasalazine, Avapro [irbesartan], Onglyza [saxagliptin], Spironolactone, Valsartan, Amlodipine besylate, Amlodipine besylate, Asparagus, Azithromycin, Chlorthalidone, Codeine, Dulaglutide, Hydrochlorothiazide, Iodinated diagnostic agents, Metformin, Metformin and related, Other, Sulfa antibiotics, and Sulfonamide derivatives   Assessment / Plan:     Visit Diagnoses: Inflammatory arthritis - RF-, CCP-, 14-3-3 eta negative. ACE WNL. Sed rate elevated.01/17/2018: ultrasound of both feet +synovitis -patient was placed on Plaquenil last visit but Donna Dyer did not to start as Donna Dyer is concerned about the side effects.  Donna Dyer does not have much synovitis on examination today.  Primary osteoarthritis of both hands - Plan: Joint protection was discussed.  Primary osteoarthritis of both knees - Plan: Donna Dyer had some intentional weight loss.  Need for regular exercise was emphasized.  Primary osteoarthritis of both feet - Plan: Donna Dyer complains of discomfort in her toes.  The ultrasound in the past showed mild synovitis.  I do not see any clinical synovitis on examination today.  Donna Dyer wants to hold off any treatment.  Natural anti-inflammatories were discussed.  DDD (degenerative disc disease),  cervical -doing well currently.  DDD (degenerative disc disease), lumbar -Donna Dyer has chronic discomfort.  Weight loss should be helpful.  History of diabetes mellitus    Vaginal intraepithelial neoplasia III (VAIN III)   Multinodular goiter   History of hypertension   MGUS (monoclonal gammopathy of unknown significance)  History of sleep apnea   History of anemia    Orders: No orders of the defined types were placed in this encounter.  No orders of the defined types were placed in this encounter.    Follow-Up Instructions: Return in about 6 months (around 01/17/2019) for Osteoarthritis.  Donna Dyer was supposed to notify me if Donna Dyer develops any new symptoms.   Bo Merino, MD  Note - This record has been created using [employer].  Chart creation errors have been sought, but may not always  have been located. Such creation errors do not reflect on  the standard of medical care.

## 2018-07-17 ENCOUNTER — Ambulatory Visit: Payer: BC Managed Care – PPO | Admitting: Rheumatology

## 2018-07-17 ENCOUNTER — Encounter: Payer: Self-pay | Admitting: Rheumatology

## 2018-07-17 ENCOUNTER — Other Ambulatory Visit: Payer: Self-pay

## 2018-07-17 VITALS — BP 128/76 | HR 60 | Resp 12 | Ht 66.0 in | Wt 223.0 lb

## 2018-07-17 DIAGNOSIS — M170 Bilateral primary osteoarthritis of knee: Secondary | ICD-10-CM

## 2018-07-17 DIAGNOSIS — Z8679 Personal history of other diseases of the circulatory system: Secondary | ICD-10-CM

## 2018-07-17 DIAGNOSIS — M19071 Primary osteoarthritis, right ankle and foot: Secondary | ICD-10-CM | POA: Diagnosis not present

## 2018-07-17 DIAGNOSIS — Z862 Personal history of diseases of the blood and blood-forming organs and certain disorders involving the immune mechanism: Secondary | ICD-10-CM

## 2018-07-17 DIAGNOSIS — M5136 Other intervertebral disc degeneration, lumbar region: Secondary | ICD-10-CM

## 2018-07-17 DIAGNOSIS — D072 Carcinoma in situ of vagina: Secondary | ICD-10-CM

## 2018-07-17 DIAGNOSIS — D472 Monoclonal gammopathy: Secondary | ICD-10-CM

## 2018-07-17 DIAGNOSIS — Z8669 Personal history of other diseases of the nervous system and sense organs: Secondary | ICD-10-CM

## 2018-07-17 DIAGNOSIS — M19041 Primary osteoarthritis, right hand: Secondary | ICD-10-CM

## 2018-07-17 DIAGNOSIS — M19072 Primary osteoarthritis, left ankle and foot: Secondary | ICD-10-CM

## 2018-07-17 DIAGNOSIS — M1990 Unspecified osteoarthritis, unspecified site: Secondary | ICD-10-CM | POA: Diagnosis not present

## 2018-07-17 DIAGNOSIS — E042 Nontoxic multinodular goiter: Secondary | ICD-10-CM

## 2018-07-17 DIAGNOSIS — M5030 Other cervical disc degeneration, unspecified cervical region: Secondary | ICD-10-CM

## 2018-07-17 DIAGNOSIS — M19042 Primary osteoarthritis, left hand: Secondary | ICD-10-CM

## 2018-07-17 DIAGNOSIS — Z8639 Personal history of other endocrine, nutritional and metabolic disease: Secondary | ICD-10-CM

## 2018-07-17 DIAGNOSIS — M51369 Other intervertebral disc degeneration, lumbar region without mention of lumbar back pain or lower extremity pain: Secondary | ICD-10-CM

## 2018-08-30 DIAGNOSIS — L0293 Carbuncle, unspecified: Secondary | ICD-10-CM | POA: Diagnosis not present

## 2018-08-30 DIAGNOSIS — L720 Epidermal cyst: Secondary | ICD-10-CM | POA: Diagnosis not present

## 2018-08-30 DIAGNOSIS — L02412 Cutaneous abscess of left axilla: Secondary | ICD-10-CM | POA: Diagnosis not present

## 2018-09-05 DIAGNOSIS — E119 Type 2 diabetes mellitus without complications: Secondary | ICD-10-CM | POA: Diagnosis not present

## 2018-09-05 DIAGNOSIS — E042 Nontoxic multinodular goiter: Secondary | ICD-10-CM | POA: Diagnosis not present

## 2018-09-05 DIAGNOSIS — I10 Essential (primary) hypertension: Secondary | ICD-10-CM | POA: Diagnosis not present

## 2018-09-05 DIAGNOSIS — E782 Mixed hyperlipidemia: Secondary | ICD-10-CM | POA: Diagnosis not present

## 2018-09-05 DIAGNOSIS — E559 Vitamin D deficiency, unspecified: Secondary | ICD-10-CM | POA: Diagnosis not present

## 2018-09-05 LAB — MICROALBUMIN, URINE: Microalb, Ur: 10

## 2018-09-05 LAB — BASIC METABOLIC PANEL
BUN: 32 — AB (ref 4–21)
BUN: 32 — AB (ref 4–21)
CO2: 21 (ref 13–22)
Chloride: 104 (ref 99–108)
Creatinine: 1.2 — AB (ref <=1.1)
Glucose: 89
Glucose: 89
Potassium: 4.2 (ref 3.4–5.3)
Potassium: 4.2 (ref 3.4–5.3)
Sodium: 135 — AB (ref 137–147)
Sodium: 135 — AB (ref 137–147)

## 2018-09-05 LAB — COMPREHENSIVE METABOLIC PANEL
Albumin: 4.1 (ref 3.5–5.0)
Calcium: 9.3 (ref 8.7–10.7)

## 2018-09-05 LAB — T4, FREE: Free T4: 0.9

## 2018-09-05 LAB — VITAMIN D 25 HYDROXY (VIT D DEFICIENCY, FRACTURES)
Vit D, 25-Hydroxy: 33
Vit D, 25-Hydroxy: 33

## 2018-09-05 LAB — T3, FREE: Free T3: 2.64

## 2018-09-05 LAB — TSH
TSH: 0.96 (ref 0.41–5.90)
TSH: 0.96 (ref <=5.90)

## 2018-09-05 LAB — HEMOGLOBIN A1C: Hemoglobin A1C: 6.3

## 2018-09-20 ENCOUNTER — Other Ambulatory Visit: Payer: Self-pay | Admitting: Cardiology

## 2018-09-20 DIAGNOSIS — G4733 Obstructive sleep apnea (adult) (pediatric): Secondary | ICD-10-CM | POA: Diagnosis not present

## 2018-09-20 DIAGNOSIS — Z9989 Dependence on other enabling machines and devices: Secondary | ICD-10-CM | POA: Diagnosis not present

## 2018-09-24 DIAGNOSIS — L732 Hidradenitis suppurativa: Secondary | ICD-10-CM | POA: Diagnosis not present

## 2018-09-26 ENCOUNTER — Other Ambulatory Visit: Payer: Self-pay

## 2018-09-27 MED ORDER — LOSARTAN POTASSIUM 100 MG PO TABS: 100.0000 mg | ORAL_TABLET | Freq: Every day | ORAL | 0 refills | Status: DC

## 2018-10-05 ENCOUNTER — Ambulatory Visit: Payer: BC Managed Care – PPO | Admitting: Family Medicine

## 2018-10-05 ENCOUNTER — Encounter: Payer: Self-pay | Admitting: Family Medicine

## 2018-10-05 ENCOUNTER — Other Ambulatory Visit: Payer: Self-pay

## 2018-10-05 VITALS — BP 136/55 | HR 67 | Temp 98.3°F | Ht 66.0 in | Wt 212.0 lb

## 2018-10-05 DIAGNOSIS — H9313 Tinnitus, bilateral: Secondary | ICD-10-CM

## 2018-10-05 DIAGNOSIS — S0300XA Dislocation of jaw, unspecified side, initial encounter: Secondary | ICD-10-CM

## 2018-10-05 NOTE — Progress Notes (Addendum)
Acute Office Visit  Subjective:    Patient ID: Donna Dyer, female    DOB: 06/04/1960, 58 y.o.   MRN: [id number]  Chief Complaint  Patient presents with  . Tinnitus    x 1 month  . jaw popping    L sided over 1 yr denies pain    HPI Patient is in today for ear ringing for about 1 month.  She says it is worse in the left ear compared to the right.  She says she feels like it is a little bit better when she lays down she has not had any significant hearing loss or fullness in the ears or pain or pressure.  No recent sinus symptoms.  She does take Allegra daily for allergies.    She has had some popping in the left side of her jaw over the last year but has a history of TMJ, but says that was a most 30 years ago.  She has not had problems since then until this last year.  She denies any known injury or trauma.  Mentioned it to her dentist twice.  Gave her some recommendations as far as avoiding excess chewing etc.  Her dentist has not noticed any evidence of teeth grinding.  Past Medical History:  Diagnosis Date  . Anemia   . Asthma 11/30/2015  . Bleeding hemorrhoids 06/05/2015  . DDD (degenerative disc disease), cervical 11/30/2015  . DDD (degenerative disc disease), lumbar 11/30/2015  . Diabetes mellitus   . Edema    LLE- podiatry in [place]  . Endometriosis   . Fibromyalgia 11/30/2015  . Goiter   . Hemorrhoids   . Hyperlipidemia   . Hypertension   . Iron malabsorption 06/05/2015  . Lumbosacral radiculopathy at S1 07/18/2014   Bilateral  . MGUS (monoclonal gammopathy of unknown significance) 06/05/2015  . Osteoarthritis of both hands 11/30/2015  . Osteoarthritis of both knees 11/30/2015  . Osteopenia   . Osteoporosis 11/30/2015  . Other iron deficiency anemias 06/05/2015  . Pain, joint, hip, right    chronic- Dr Alvan Dame Dr Nelva Bush  . Prediabetes   . Pseudogout 11/30/2015  . Sleep apnea     Past Surgical History:  Procedure Laterality Date  . Back Injections    .  ESOPHAGOGASTRODUODENOSCOPY  7-11   for anemia  . FLEXIBLE SIGMOIDOSCOPY  01/31/2011   Procedure: FLEXIBLE SIGMOIDOSCOPY;  Surgeon: Missy Sabins, MD;  Location: [facility];  Service: Endoscopy;  Laterality: N/A;  . TOTAL ABDOMINAL HYSTERECTOMY  1995   bilat oophorectomy/ endometriosis  . VULVAR LESION REMOVAL  09/07/2011   Procedure: VULVAR LESION;  Surgeon: Alvino Chapel, MD;  Location: [facility];  Service: Gynecology;  Laterality: N/A;  vaginal lesion    Family History  Problem Relation Age of Onset  . Rheum arthritis Mother   . Hypertension Mother   . Cancer Father        bladder  . Heart attack Father        MI at age 87  . Diabetes Brother   . Diabetes Brother   . Diabetes Brother   . Diabetes Brother   . Sarcoidosis Brother   . Sarcoidosis Sister   . Diabetes Sister   . Polycystic ovary syndrome Daughter   . Hypertension Daughter   . Anesthesia problems Neg Hx   . Hypotension Neg Hx   . Malignant hyperthermia Neg Hx   . Pseudochol deficiency Neg Hx     Social History   Socioeconomic  History  . Marital status: Married    Spouse name: Not on file  . Number of children: 2  . Years of education: Not on file  . Highest education level: Not on file  Occupational History  . 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 Financial resource strain: Not on file  . Food insecurity    Worry: Not on file    Inability: Not on file  . Transportation needs    Medical: Not on file    Non-medical: Not on file  Tobacco Use  . Smoking status: Never Smoker  . Smokeless tobacco: Never Used  Substance and Sexual Activity  . Alcohol use: No    Alcohol/week: 0.0 standard drinks    Comment: Doesn't drink   . Drug use: No  . Sexual activity: Yes    Birth control/protection: Surgical  Lifestyle  . Physical activity    Days per week: Not on file    Minutes per session: Not on file  . Stress: Not on file   Relationships  . Social Herbalist on phone: Not on file    Gets together: Not on file    Attends religious service: Not on file    Active member of club or organization: Not on file    Attends meetings of clubs or organizations: Not on file    Relationship status: Not on file  . Intimate partner violence    Fear of current or ex partner: Not on file    Emotionally abused: Not on file    Physically abused: Not on file    Forced sexual activity: Not on file  Other Topics Concern  . Not on file  Social History Narrative   Lives in [place] with husband and daughter. Ambulatory, no cane or walker. Works at [employer].    [employer].   Right handed.   Caffeine none.    Outpatient Medications Prior to Visit  Medication Sig Dispense Refill  . Acetaminophen (TYLENOL EXTRA STRENGTH PO) Take 2 tablets by mouth as needed.    . fexofenadine (ALLEGRA) 180 MG tablet Take 1 tablet (180 mg total) by mouth daily. 90 tablet 1  . fluticasone (FLONASE) 50 MCG/ACT nasal spray Place into both nostrils as needed.     Marland Kitchen glucose blood test strip     . losartan (COZAAR) 100 MG tablet Take 1 tablet (100 mg total) by mouth daily. 90 tablet 0  . Multiple Vitamin (MULTIVITAMIN) tablet Take 1 tablet by mouth daily.      . Nebivolol HCl 20 MG TABS Take 1 tablet (20 mg total) by mouth daily. 90 tablet 3  . spironolactone (ALDACTONE) 25 MG tablet Take 12.5 mg by mouth daily.    Marland Kitchen triamcinolone cream (KENALOG) 0.1 % Apply 1 application topically 2 (two) times daily. (Patient taking differently: Apply 1 application topically as needed. ) 30 g 0  . verapamil (CALAN-SR) 240 MG CR tablet Take 120 mg by mouth at bedtime.    Marland Kitchen spironolactone (ALDACTONE) 25 MG tablet TAKE 1 TABLET BY MOUTH EVERY DAY 90 tablet 1  . verapamil (CALAN-SR) 240 MG CR tablet TAKE 1 TABLET BY MOUTH EVERYDAY AT BEDTIME 90 tablet 1   No facility-administered medications prior to visit.     Allergies  Allergen  Reactions  . Sulfamethoxazole Rash  . Pineapple Itching and Swelling    Tongue swells Tongue swells  . Sulfasalazine Rash  .  Avapro [Irbesartan] Other (See Comments)  . Onglyza [Saxagliptin] Itching  . Spironolactone Other (See Comments)    Dizzy and nauseated.  Dizzy and nauseated.  Dizzy and nauseated.  **currently taking w/ better toleration   . Valsartan Other (See Comments)    Leg cramps  Leg cramps **Currently taking w/ better toleration 07/12/17  . Amlodipine Besylate Other (See Comments)    muscle cramps  . Amlodipine Besylate Other (See Comments)    muscle cramps  . Asparagus Itching    Of the mouth  . Azithromycin Itching and Rash  . Chlorthalidone Other (See Comments)    Muscle cramps Muscle cramps  . Codeine Nausea And Vomiting  . Dulaglutide Itching    ? Hypersensitivity reaction ? Hypersensitivity reaction  . Hydrochlorothiazide Other (See Comments)    Muscle spasms   Muscle spasms   . Iodinated Diagnostic Agents     Never had IV dye Never had IV dye Other reaction(s): Other (See Comments) Never had IV dye Never had IV dye  . Metformin Diarrhea    Diarrhea when started on metformin IR 140m daily Diarrhea when started on metformin IR 1061mdaily Diarrhea when started on metformin IR 10073maily Diarrhea when started on metformin IR 100m31mily  . Metformin And Related Diarrhea  . Other Itching    Never had IV dye  . Sulfa Antibiotics Rash    Other reaction(s): RASH Other reaction(s): RASH   . Sulfonamide Derivatives Rash    ROS     Objective:    Physical Exam  Constitutional: She is oriented to person, place, and time. She appears well-developed and well-nourished.  HENT:  Head: Normocephalic and atraumatic.  Right Ear: External ear normal.  Left Ear: External ear normal.  Nose: Nose normal.  Mouth/Throat: Oropharynx is clear and moist.  TMs and canals are clear.   Eyes: Pupils are equal, round, and reactive to light. Conjunctivae and  EOM are normal.  Neck: Neck supple. No thyromegaly present.  Cardiovascular: Normal rate and regular rhythm.  No carotid bruits. 2/6 SEM.  Pulmonary/Chest: Effort normal and breath sounds normal. She has no wheezes.  Lymphadenopathy:    She has no cervical adenopathy.  Neurological: She is alert and oriented to person, place, and time.  Skin: Skin is warm and dry.  Psychiatric: She has a normal mood and affect.    BP (!) 136/55   Pulse 67   Temp 98.3 F (36.8 C)   Ht _0  (1.676 m)   Wt 212 lb (96.2 kg)   SpO2 100%   BMI 34.22 kg/m  Wt Readings from Last 3 Encounters:  10/05/18 212 lb (96.2 kg)  07/17/18 223 lb (101.2 kg)  01/24/18 226 lb (102.5 kg)    There are no preventive care reminders to display for this patient.  There are no preventive care reminders to display for this patient.   Lab Results  Component Value Date   TSH 1.085 08/25/2011   Lab Results  Component Value Date   WBC 7.2 10/20/2017   HGB 10.1 (L) 10/20/2017   HCT 32.1 (L) 10/20/2017   MCV 87.5 10/20/2017   PLT 273 10/20/2017   Lab Results  Component Value Date   NA 137 10/20/2017   K 4.3 10/20/2017   CHLORIDE 101 06/10/2016   CO2 25 10/20/2017   GLUCOSE 107 (H) 10/20/2017   BUN 20 10/20/2017   CREATININE 1.24 (H) 10/20/2017   BILITOT 0.7 10/20/2017   ALKPHOS 118 10/20/2017   AST  20 10/20/2017   ALT 19 10/20/2017   PROT 8.5 (H) 10/20/2017   ALBUMIN 3.7 10/20/2017   CALCIUM 9.4 10/20/2017   ANIONGAP 10 10/20/2017   EGFR 60 (L) 06/10/2016   GFR 73.96 10/22/2010   Lab Results  Component Value Date   CHOL 145 01/16/2015   Lab Results  Component Value Date   HDL 52 01/16/2015   Lab Results  Component Value Date   LDLCALC 55 01/16/2015   Lab Results  Component Value Date   TRIG 191 (A) 01/16/2015   Lab Results  Component Value Date   CHOLHDL 4.7 03/15/2013   Lab Results  Component Value Date   HGBA1C 6.8 09/01/2017       Assessment & Plan:   Problem List Items  Addressed This Visit    None    Visit Diagnoses    Tinnitus of both ears    -  Primary   Dislocation of temporomandibular joint, initial encounter         Tinnitus-unclear etiology.  Ear exam is normal.  Tympanometry is normal today.  No significant allergy symptoms and she is taking Allegra.  We did discuss a trial of oral prednisone but she declined as her A1c looks great right now.  We did discuss trial of nasal steroid sprays.  She is going to try that for the next 10 to 14 days and if not helpful she will call me back we will plan to refer to ENT for further work-up.  Left TMJ-discussed that most treatments are conservative with just changing how she chews and avoidance of excess chewing.  Tums and anti-inflammatories can be used as well.  She is not currently having a lot of pain.  As far she is aware she does not grind her teeth or clench a lot.Again discussed refer to ENT if not improving.     No orders of the defined types were placed in this encounter.    Beatrice Lecher, MD

## 2018-10-05 NOTE — Patient Instructions (Addendum)
Start flonase 2 squirts in each nostril daily for 10 to 14 days.  If you feel like it is helpful then I would continue for 3 weeks and then wean off.  If it is not helpful at that point then please give [place] call back and we will refer you to ENT for further consultation.   Tinnitus Tinnitus refers to hearing a sound when there is no actual source for that sound. This is often described as ringing in the ears. However, people with this condition may hear a variety of noises, in one ear or in both ears. The sounds of tinnitus can be soft, loud, or somewhere in between. Tinnitus can last for a few seconds or can be constant for days. It may go away without treatment and come back at various times. When tinnitus is constant or happens often, it can lead to other problems, such as trouble sleeping and trouble concentrating. Almost everyone experiences tinnitus at some point. Tinnitus that is long-lasting (chronic) or comes back often (recurs) may require medical attention. What are the causes? The cause of tinnitus is often not known. In some cases, it can result from other problems or conditions, including:  Exposure to loud noises from machinery, music, or other sources.  Hearing loss.  Ear or sinus infections.  Earwax buildup.  An object (foreign body) stuck in the ear.  Taking certain medicines.  Drinking alcohol or caffeine.  High blood pressure.  Heart diseases.  Anemia.  Allergies.  Meniere's disease.  Thyroid problems.  Tumors.  A weak, bulging blood vessel (aneurysm) near the ear.  Depression or other mood disorders. What are the signs or symptoms? The main symptom of tinnitus is hearing a sound when there is no source for that sound. It may sound like:  Buzzing.  Roaring.  Ringing.  Blowing air, like the sound heard when you listen to a seashell.  Hissing.  Whistling.  Sizzling.  Humming.  Running water.  A musical note.  Tapping. Symptoms may  affect only one ear (unilateral) or both ears (bilateral). How is this diagnosed? Tinnitus is diagnosed based on your symptoms, your medical history, and a physical exam. Your health care provider may do a thorough hearing test (audiologic exam) if your tinnitus:  Is unilateral.  Causes hearing difficulties.  Lasts 6 months or longer. You may work with a health care provider who specializes in hearing disorders (audiologist). You may be asked questions about your symptoms and how they affect your daily life. You may have other tests done, such as:  CT scan.  MRI.  An imaging test of how blood flows through your blood vessels (angiogram). How is this treated? Treating an underlying medical condition can sometimes make tinnitus go away. If your tinnitus continues, other treatments may include:  Medicines, such as antidepressants or sleeping aids.  Sound generators to mask the tinnitus. These include: ? Tabletop sound machines that play relaxing sounds to help you fall asleep. ? Wearable devices that fit in your ear and play sounds or music. ? Acoustic neural stimulation. This involves using headphones to listen to music that contains an auditory signal. Over time, listening to this signal may change some pathways in your brain and make you less sensitive to tinnitus. This treatment is used for very severe cases when no other treatment is working.  Therapy and counseling to help you manage the stress of living with tinnitus.  Using hearing aids or cochlear implants if your tinnitus is related to hearing  loss. Hearing aids are worn in the outer ear. Cochlear implants are surgically placed in the inner ear. Follow these instructions at home: Managing symptoms      When possible, avoid being in loud places and being exposed to loud sounds.  Wear hearing protection, such as earplugs, when you are exposed to loud noises.  Use a white noise machine, a humidifier, or other devices to  mask the sound of tinnitus.  Practice techniques for reducing stress, such as meditation, yoga, or deep breathing. Work with your health care provider if you need help with managing stress.  Sleep with your head slightly raised. This may reduce the impact of tinnitus. General instructions  Do not use stimulants, such as nicotine, alcohol, or caffeine. Talk with your health care provider about other stimulants to avoid. Stimulants are substances that can make you feel alert and attentive by increasing certain activities in the body (such as heart rate and blood pressure). These substances may make tinnitus worse.  Take over-the-counter and prescription medicines only as told by your health care provider.  Try to get plenty of sleep each night.  Keep all follow-up visits as told by your health care provider. This is important. Contact a health care provider if:  Your tinnitus continues for 3 weeks or longer without stopping.  Your symptoms get worse or do not get better with home care.  You develop tinnitus after a head injury.  You have tinnitus along with any of the following: ? Dizziness. ? Loss of balance. ? Nausea and vomiting. Summary  Tinnitus refers to hearing a sound when there is no actual source for that sound. This is often described as ringing in the ears.  Symptoms may affect only one ear (unilateral) or both ears (bilateral).  Use a white noise machine, a humidifier, or other devices to mask the sound of tinnitus.  Do not use stimulants, such as nicotine, alcohol, or caffeine. Talk with your health care provider about other stimulants to avoid. These substances may make tinnitus worse. This information is not intended to replace advice given to you by your health care provider. Make sure you discuss any questions you have with your health care provider. Document Released: 12/27/2004 Document Revised: 12/09/2016 Document Reviewed: 10/06/2016 Elsevier Patient Education   2020 [employer].

## 2018-10-11 ENCOUNTER — Encounter: Payer: Self-pay | Admitting: Family Medicine

## 2018-10-11 LAB — T3, FREE
CO2: 21
EGFR (African American): 55
Free T4: 0.9
T3, Free: 2.64

## 2018-10-12 ENCOUNTER — Other Ambulatory Visit: Payer: Self-pay | Admitting: Cardiology

## 2018-10-22 ENCOUNTER — Encounter: Payer: Self-pay | Admitting: Family Medicine

## 2018-10-22 DIAGNOSIS — H9319 Tinnitus, unspecified ear: Secondary | ICD-10-CM

## 2018-11-02 DIAGNOSIS — J309 Allergic rhinitis, unspecified: Secondary | ICD-10-CM | POA: Diagnosis not present

## 2018-11-02 DIAGNOSIS — H9313 Tinnitus, bilateral: Secondary | ICD-10-CM | POA: Diagnosis not present

## 2018-11-02 DIAGNOSIS — H903 Sensorineural hearing loss, bilateral: Secondary | ICD-10-CM | POA: Diagnosis not present

## 2018-11-02 DIAGNOSIS — E042 Nontoxic multinodular goiter: Secondary | ICD-10-CM | POA: Diagnosis not present

## 2018-11-12 DIAGNOSIS — G4733 Obstructive sleep apnea (adult) (pediatric): Secondary | ICD-10-CM | POA: Diagnosis not present

## 2018-11-30 DIAGNOSIS — Z1231 Encounter for screening mammogram for malignant neoplasm of breast: Secondary | ICD-10-CM | POA: Diagnosis not present

## 2018-12-04 NOTE — Progress Notes (Signed)
HPI: FUhypertension. Echocardiogram in October of 2012 showed normal LV function, grade 1 diastolic dysfunction, and mild left atrial enlargement. She states HCTZ caused "cramps". Laboratories 09/05/2018 showed potassium 4.2 and creatinine 1.24.  Since last seen,the patient denies any dyspnea on exertion, orthopnea, PND, pedal edema, palpitations, syncope or chest pain.   Current Outpatient Medications  Medication Sig Dispense Refill  . Acetaminophen (TYLENOL EXTRA STRENGTH PO) Take 2 tablets by mouth as needed.    . fexofenadine (ALLEGRA) 180 MG tablet Take 1 tablet (180 mg total) by mouth daily. 90 tablet 1  . fluticasone (FLONASE) 50 MCG/ACT nasal spray Place into both nostrils as needed.     Marland Kitchen glucose blood test strip     . losartan (COZAAR) 100 MG tablet Take 1 tablet (100 mg total) by mouth daily. 90 tablet 0  . Multiple Vitamin (MULTIVITAMIN) tablet Take 1 tablet by mouth daily.      . Nebivolol HCl 20 MG TABS Take 1 tablet (20 mg total) by mouth daily. 90 tablet 3  . spironolactone (ALDACTONE) 25 MG tablet Take 12.5 mg by mouth daily.    Marland Kitchen triamcinolone cream (KENALOG) 0.1 % Apply 1 application topically 2 (two) times daily. (Patient taking differently: Apply 1 application topically as needed. ) 30 g 0  . verapamil (CALAN-SR) 240 MG CR tablet TAKE 1 TABLET BY MOUTH EVERYDAY AT BEDTIME 90 tablet 1   No current facility-administered medications for this visit.      Past Medical History:  Diagnosis Date  . Anemia   . Asthma 11/30/2015  . Bleeding hemorrhoids 06/05/2015  . DDD (degenerative disc disease), cervical 11/30/2015  . DDD (degenerative disc disease), lumbar 11/30/2015  . Diabetes mellitus   . Edema    LLE- podiatry in [place]  . Endometriosis   . Fibromyalgia 11/30/2015  . Goiter   . Hemorrhoids   . Hyperlipidemia   . Hypertension   . Iron malabsorption 06/05/2015  . Lumbosacral radiculopathy at S1 07/18/2014   Bilateral  . MGUS (monoclonal gammopathy of  unknown significance) 06/05/2015  . Osteoarthritis of both hands 11/30/2015  . Osteoarthritis of both knees 11/30/2015  . Osteopenia   . Osteoporosis 11/30/2015  . Other iron deficiency anemias 06/05/2015  . Pain, joint, hip, right    chronic- Dr Alvan Dame Dr Nelva Bush  . Prediabetes   . Pseudogout 11/30/2015  . Sleep apnea     Past Surgical History:  Procedure Laterality Date  . Back Injections    . ESOPHAGOGASTRODUODENOSCOPY  7-11   for anemia  . FLEXIBLE SIGMOIDOSCOPY  01/31/2011   Procedure: FLEXIBLE SIGMOIDOSCOPY;  Surgeon: Missy Sabins, MD;  Location: [facility];  Service: Endoscopy;  Laterality: N/A;  . TOTAL ABDOMINAL HYSTERECTOMY  1995   bilat oophorectomy/ endometriosis  . VULVAR LESION REMOVAL  09/07/2011   Procedure: VULVAR LESION;  Surgeon: Alvino Chapel, MD;  Location: [facility];  Service: Gynecology;  Laterality: N/A;  vaginal lesion    Social History   Socioeconomic History  . Marital status: Married    Spouse name: Not on file  . Number of children: 2  . Years of education: Not on file  . Highest education level: Not on file  Occupational History  . 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 Financial resource strain: Not on file  . Food insecurity    Worry: Not on file    Inability: Not on file  .  Transportation needs    Medical: Not on file    Non-medical: Not on file  Tobacco Use  . Smoking status: Never Smoker  . Smokeless tobacco: Never Used  Substance and Sexual Activity  . Alcohol use: No    Alcohol/week: 0.0 standard drinks    Comment: Doesn't drink   . Drug use: No  . Sexual activity: Yes    Birth control/protection: Surgical  Lifestyle  . Physical activity    Days per week: Not on file    Minutes per session: Not on file  . Stress: Not on file  Relationships  . Social Herbalist on phone: Not on file    Gets together: Not on file    Attends religious  service: Not on file    Active member of club or organization: Not on file    Attends meetings of clubs or organizations: Not on file    Relationship status: Not on file  . Intimate partner violence    Fear of current or ex partner: Not on file    Emotionally abused: Not on file    Physically abused: Not on file    Forced sexual activity: Not on file  Other Topics Concern  . Not on file  Social History Narrative   Lives in [place] with husband and daughter. Ambulatory, no cane or walker. Works at [employer].    [employer].   Right handed.   Caffeine none.    Family History  Problem Relation Age of Onset  . Rheum arthritis Mother   . Hypertension Mother   . Cancer Father        bladder  . Heart attack Father        MI at age 36  . Diabetes Brother   . Diabetes Brother   . Diabetes Brother   . Diabetes Brother   . Sarcoidosis Brother   . Sarcoidosis Sister   . Diabetes Sister   . Polycystic ovary syndrome Daughter   . Hypertension Daughter   . Anesthesia problems Neg Hx   . Hypotension Neg Hx   . Malignant hyperthermia Neg Hx   . Pseudochol deficiency Neg Hx     ROS: no fevers or chills, productive cough, hemoptysis, dysphasia, odynophagia, melena, hematochezia, dysuria, hematuria, rash, seizure activity, orthopnea, PND, pedal edema, claudication. Remaining systems are negative.  Physical Exam: Well-developed well-nourished in no acute distress.  Skin is warm and dry.  HEENT is normal.  Neck is supple.  Chest is clear to auscultation with normal expansion.  Cardiovascular exam is regular rate and rhythm.  Abdominal exam nontender or distended. No masses palpated. Extremities show no edema. neuro grossly intact  ECG-sinus rhythm at a rate of 70, nonspecific ST changes.  Personally reviewed  A/P  1 hypertension-patient's blood pressure is controlled.  Continue present medical regimen.  2 hyperlipidemia-managed by primary care.  3 diabetes  mellitus-managed by primary care.  Kirk Ruths, MD

## 2018-12-12 ENCOUNTER — Encounter: Payer: Self-pay | Admitting: Cardiology

## 2018-12-12 ENCOUNTER — Other Ambulatory Visit: Payer: Self-pay

## 2018-12-12 ENCOUNTER — Ambulatory Visit (INDEPENDENT_AMBULATORY_CARE_PROVIDER_SITE_OTHER): Payer: BC Managed Care – PPO | Admitting: Cardiology

## 2018-12-12 VITALS — BP 134/76 | HR 70 | Ht 66.0 in | Wt 220.0 lb

## 2018-12-12 DIAGNOSIS — G4733 Obstructive sleep apnea (adult) (pediatric): Secondary | ICD-10-CM | POA: Diagnosis not present

## 2018-12-12 DIAGNOSIS — E7800 Pure hypercholesterolemia, unspecified: Secondary | ICD-10-CM | POA: Diagnosis not present

## 2018-12-12 DIAGNOSIS — I10 Essential (primary) hypertension: Secondary | ICD-10-CM | POA: Diagnosis not present

## 2018-12-12 NOTE — Patient Instructions (Signed)

## 2018-12-21 DIAGNOSIS — G4733 Obstructive sleep apnea (adult) (pediatric): Secondary | ICD-10-CM | POA: Diagnosis not present

## 2018-12-21 DIAGNOSIS — Z9989 Dependence on other enabling machines and devices: Secondary | ICD-10-CM | POA: Diagnosis not present

## 2018-12-25 ENCOUNTER — Other Ambulatory Visit: Payer: Self-pay | Admitting: Cardiology

## 2018-12-25 NOTE — Telephone Encounter (Signed)
Rx has been sent to the pharmacy electronically. ° °

## 2019-01-12 DIAGNOSIS — G4733 Obstructive sleep apnea (adult) (pediatric): Secondary | ICD-10-CM | POA: Diagnosis not present

## 2019-01-16 DIAGNOSIS — Z021 Encounter for pre-employment examination: Secondary | ICD-10-CM | POA: Diagnosis not present

## 2019-01-16 DIAGNOSIS — Z20822 Contact with and (suspected) exposure to covid-19: Secondary | ICD-10-CM | POA: Diagnosis not present

## 2019-01-24 ENCOUNTER — Encounter: Payer: Self-pay | Admitting: Family Medicine

## 2019-01-25 ENCOUNTER — Telehealth (INDEPENDENT_AMBULATORY_CARE_PROVIDER_SITE_OTHER): Payer: BC Managed Care – PPO | Admitting: Family Medicine

## 2019-01-25 ENCOUNTER — Encounter: Payer: Self-pay | Admitting: Family Medicine

## 2019-01-25 VITALS — BP 128/72 | HR 66 | Temp 98.3°F | Ht 66.0 in | Wt 219.5 lb

## 2019-01-25 DIAGNOSIS — Z20822 Contact with and (suspected) exposure to covid-19: Secondary | ICD-10-CM | POA: Diagnosis not present

## 2019-01-25 DIAGNOSIS — J069 Acute upper respiratory infection, unspecified: Secondary | ICD-10-CM | POA: Diagnosis not present

## 2019-01-25 NOTE — Progress Notes (Signed)
Virtual Visit via Video Note  I connected with [facility] on 01/25/19 at  2:40 PM EST by a video enabled telemedicine application and verified that I am speaking with the correct person using two identifiers.   I discussed the limitations of evaluation and management by telemedicine and the availability of in person appointments. The patient expressed understanding and agreed to proceed.  Subjective:    CC: Possible COVID   HPI:  Pt reports that she and her family were exposed to [place]. She was tested on 01/16/2019, about a day after she started to develop a fever and mild URI sxs.  She stated that her test was Negative. She said that she has been taking care of  her daughter and husband who are in the home and who tested positive. They had mild sxs and no fver as well. Though her husband had a cough.   She reports that she has been running a low grade temp 99.9 and below no temps over 100 for 1 wk. She has been 24 hours w/o any fever. The last time that she took something for her temp was on 1/13 (Coricidin HBP).  She  deniesanysweats,chills,nausea,vomiting,diarrhea.   She wanst to know when safe to come out of quarantine.   Past medical history, Surgical history, Family history not pertinant except as noted below, Social history, Allergies, and medications have been entered into the medical record, reviewed, and corrections made.   Review of Systems: No fevers, chills, night sweats, weight loss, chest pain, or shortness of breath.   Objective:    General: Speaking clearly in complete sentences without any shortness of breath.  Alert and oriented x3.  Normal judgment. No apparent acute distress.    Impression and Recommendations:    Suspect COVID - once fever free for 48 hours ok ot come out of quarantine as long as sxs are improving. Call if any new sxs or worsening sxs. OK to continue OTC meds.       I discussed the assessment and treatment plan with the patient. The  patient was provided an opportunity to ask questions and all were answered. The patient agreed with the plan and demonstrated an understanding of the instructions.   The patient was advised to call back or seek an in-person evaluation if the symptoms worsen or if the condition fails to improve as anticipated.   Beatrice Lecher, MD

## 2019-01-25 NOTE — Progress Notes (Signed)
Pt reports that she and her family were exposed to [place]. She was tested on 01/16/2019 and stated that her test was Negative. She said that she has been taking care of them.  She reports that she has been running a low grade temp 99.9 and below no temps over 100 for 1 wk. She has been 24 hours w/o any fever. The last time that she took something for her temp was on 1/13 (Coricidin HBP).   She  deniesanysweats,chills,nausea,vomiting,diarrhea.

## 2019-01-31 ENCOUNTER — Telehealth: Payer: BC Managed Care – PPO | Admitting: Family Medicine

## 2019-02-04 DIAGNOSIS — H04123 Dry eye syndrome of bilateral lacrimal glands: Secondary | ICD-10-CM | POA: Diagnosis not present

## 2019-02-04 DIAGNOSIS — H40013 Open angle with borderline findings, low risk, bilateral: Secondary | ICD-10-CM | POA: Diagnosis not present

## 2019-02-04 DIAGNOSIS — H2513 Age-related nuclear cataract, bilateral: Secondary | ICD-10-CM | POA: Diagnosis not present

## 2019-02-04 DIAGNOSIS — H43391 Other vitreous opacities, right eye: Secondary | ICD-10-CM | POA: Diagnosis not present

## 2019-02-12 DIAGNOSIS — G4733 Obstructive sleep apnea (adult) (pediatric): Secondary | ICD-10-CM | POA: Diagnosis not present

## 2019-02-20 DIAGNOSIS — N762 Acute vulvitis: Secondary | ICD-10-CM | POA: Diagnosis not present

## 2019-02-20 DIAGNOSIS — Z6836 Body mass index (BMI) 36.0-36.9, adult: Secondary | ICD-10-CM | POA: Diagnosis not present

## 2019-02-20 DIAGNOSIS — Z87411 Personal history of vaginal dysplasia: Secondary | ICD-10-CM | POA: Diagnosis not present

## 2019-02-20 DIAGNOSIS — Z01419 Encounter for gynecological examination (general) (routine) without abnormal findings: Secondary | ICD-10-CM | POA: Diagnosis not present

## 2019-02-21 NOTE — Progress Notes (Deleted)
Office Visit Note  Patient: [facility]             Date of Birth: [birth year]           MRN: [id number]             PCP: Hali Marry, MD Referring: Hali Marry, * Visit Date: 02/27/2019 Occupation: @GUAROCC @  Subjective:  No chief complaint on file.   History of Present Illness: [facility] is a 59 y.o. female ***   Activities of Daily Living:  Patient reports morning stiffness for *** {minute/hour:19697}.   Patient {ACTIONS;DENIES/REPORTS:21021675::"Denies"} nocturnal pain.  Difficulty dressing/grooming: {ACTIONS;DENIES/REPORTS:21021675::"Denies"} Difficulty climbing stairs: {ACTIONS;DENIES/REPORTS:21021675::"Denies"} Difficulty getting out of chair: {ACTIONS;DENIES/REPORTS:21021675::"Denies"} Difficulty using hands for taps, buttons, cutlery, and/or writing: {ACTIONS;DENIES/REPORTS:21021675::"Denies"}  No Rheumatology ROS completed.   PMFS History:  Patient Active Problem List   Diagnosis Date Noted  . Metatarsalgia of both feet 05/01/2017  . Other acquired hammer toe 05/01/2017  . Toe pain, bilateral 05/01/2017  . Prediabetes 11/15/2016  . Nontoxic single thyroid nodule 03/13/2016  . DDD (degenerative disc disease), cervical 11/30/2015  . DDD (degenerative disc disease), lumbar 11/30/2015  . Osteoarthritis of both hands 11/30/2015  . Osteoarthritis of both knees 11/30/2015  . Osteoporosis 11/30/2015  . Anemia 11/30/2015  . Asthma 11/30/2015  . Bilateral primary osteoarthritis of knee 11/26/2015  . Vitamin D deficiency 10/13/2015  . Food intolerance 07/03/2015  . MGUS (monoclonal gammopathy of unknown significance) 06/05/2015  . Other iron deficiency anemias 06/05/2015  . Iron malabsorption 06/05/2015  . Bleeding hemorrhoids 06/05/2015  . Other seasonal allergic rhinitis 11/29/2013  . Venous stasis 11/29/2013  . Cramping of feet 09/10/2013  . Hyperlipidemia associated with type 2 diabetes mellitus ([facility]) 07/19/2012  . Hypersomnia  with sleep apnea 07/19/2012  . Severe obesity (BMI 35.0-39.9) with comorbidity ([place]) 07/19/2012  . Skin sensation disturbance 07/19/2012  . Thoracic or lumbosacral neuritis or radiculitis 06/16/2012  . Right L4-L5 Lumbosacral facet joint syndrome 02/16/2012  . Hypertriglyceridemia 09/26/2011  . Vaginal intraepithelial neoplasia III (VAIN III) 08/23/2011  . Borderline glaucoma, open angle with borderline findings 03/30/2011  . OSA (obstructive sleep apnea) 03/29/2011  . Multinodular goiter 02/28/2011  . Internal hemorrhoids 01/31/2011  . Globus sensation 10/06/2010  . Diabetes mellitus type 2, controlled, without complications ([facility]) [id number]  . Essential hypertension, benign 02/23/2010  . Edema 09/23/2009    Past Medical History:  Diagnosis Date  . Anemia   . Asthma 11/30/2015  . Bleeding hemorrhoids 06/05/2015  . DDD (degenerative disc disease), cervical 11/30/2015  . DDD (degenerative disc disease), lumbar 11/30/2015  . Diabetes mellitus   . Edema    LLE- podiatry in [place]  . Endometriosis   . Fibromyalgia 11/30/2015  . Goiter   . Hemorrhoids   . Hyperlipidemia   . Hypertension   . Iron malabsorption 06/05/2015  . Lumbosacral radiculopathy at S1 07/18/2014   Bilateral  . MGUS (monoclonal gammopathy of unknown significance) 06/05/2015  . Osteoarthritis of both hands 11/30/2015  . Osteoarthritis of both knees 11/30/2015  . Osteopenia   . Osteoporosis 11/30/2015  . Other iron deficiency anemias 06/05/2015  . Pain, joint, hip, right    chronic- Dr Alvan Dame Dr [place]  . Prediabetes   . Pseudogout 11/30/2015  . Sleep apnea     Family History  Problem Relation Age of Onset  . Rheum arthritis Mother   . Hypertension Mother   . Cancer Father        bladder  . Heart attack  Father        MI at age 87  . Diabetes Brother   . Diabetes Brother   . Diabetes Brother   . Diabetes Brother   . Sarcoidosis Brother   . Sarcoidosis Sister   . Diabetes Sister   . Polycystic ovary  syndrome Daughter   . Hypertension Daughter   . Anesthesia problems Neg Hx   . Hypotension Neg Hx   . Malignant hyperthermia Neg Hx   . Pseudochol deficiency Neg Hx    Past Surgical History:  Procedure Laterality Date  . Back Injections    . ESOPHAGOGASTRODUODENOSCOPY  7-11   for anemia  . FLEXIBLE SIGMOIDOSCOPY  01/31/2011   Procedure: FLEXIBLE SIGMOIDOSCOPY;  Surgeon: Missy Sabins, MD;  Location: [facility];  Service: Endoscopy;  Laterality: N/A;  . TOTAL ABDOMINAL HYSTERECTOMY  1995   bilat oophorectomy/ endometriosis  . VULVAR LESION REMOVAL  09/07/2011   Procedure: VULVAR LESION;  Surgeon: Alvino Chapel, MD;  Location: [facility];  Service: Gynecology;  Laterality: N/A;  vaginal lesion   Social History   Social History Narrative   Lives in [place] with husband and daughter. Ambulatory, no cane or walker. Works at [facility].    [place].   Right handed.   Caffeine none.   Immunization History  Administered Date(s) Administered  . Influenza,inj,Quad PF,6+ Mos 11/06/2012, 10/20/2014  . Influenza-Unspecified 10/23/2012, 10/22/2013, 10/11/2015, 10/13/2016, 10/18/2017  . PPD Test 03/15/2012  . Pneumococcal Polysaccharide-23 09/27/2010, 03/10/2011  . Tdap 01/16/2014     Objective: Vital Signs: There were no vitals taken for this visit.   Physical Exam   Musculoskeletal Exam: ***  CDAI Exam: CDAI Score: -- Patient Global: --; Provider Global: -- Swollen: --; Tender: -- Joint Exam 02/27/2019   No joint exam has been documented for this visit   There is currently no information documented on the homunculus. Go to the Rheumatology activity and complete the homunculus joint exam.  Investigation: No additional findings.  Imaging: No results found.  Recent Labs: Lab Results  Component Value Date   WBC 7.2 10/20/2017   HGB 10.1 (L) 10/20/2017   PLT 273 10/20/2017   NA 135 (A) 09/05/2018   K 4.2 09/05/2018   CL  102 10/20/2017   CO2 21 09/05/2018   GLUCOSE 107 (H) 10/20/2017   BUN 32 (A) 09/05/2018   CREATININE 1.24 (H) 10/20/2017   BILITOT 0.7 10/20/2017   ALKPHOS 118 10/20/2017   AST 20 10/20/2017   ALT 19 10/20/2017   PROT 8.5 (H) 10/20/2017   ALBUMIN 3.7 10/20/2017   CALCIUM 9.4 10/20/2017   GFRAA 55 09/05/2018    Speciality Comments: PLQ Eye Exam: 01/31/18 WNL @ [facility] Follow up in 1 year.  Procedures:  No procedures performed Allergies: Sulfamethoxazole, Pineapple, Sulfasalazine, Avapro [irbesartan], Onglyza [saxagliptin], Spironolactone, Valsartan, Amlodipine besylate, Amlodipine besylate, Asparagus, Azithromycin, Chlorthalidone, Codeine, Dulaglutide, Hydrochlorothiazide, Iodinated diagnostic agents, Metformin, Metformin and related, Other, Sulfa antibiotics, and Sulfonamide derivatives   Assessment / Plan:     Visit Diagnoses: No diagnosis found.  Orders: No orders of the defined types were placed in this encounter.  No orders of the defined types were placed in this encounter.   Face-to-face time spent with patient was *** minutes. Greater than 50% of time was spent in counseling and coordination of care.  Follow-Up Instructions: No follow-ups on file.   Earnestine Mealing, CMA  Note - This record has been created using Editor, commissioning.  Chart creation errors have been sought, but may not always  have been located. Such creation errors do not reflect on  the standard of medical care.

## 2019-02-27 ENCOUNTER — Ambulatory Visit: Payer: BC Managed Care – PPO | Admitting: Physician Assistant

## 2019-03-01 ENCOUNTER — Telehealth: Payer: Self-pay | Admitting: Family Medicine

## 2019-03-01 NOTE — Telephone Encounter (Signed)
Please call patient and see if she is still seeing endocrinology for the diabetes.  I was unable to see any recent records so I just was not sure.  If not I am happy to schedule her for a visit.  And if she is going somewhere we can get that updated information and have those records faxed over that would be great.  Also please see if she had her flu shot this year.

## 2019-03-04 NOTE — Telephone Encounter (Signed)
Request faxed.[place], [place]

## 2019-03-04 NOTE — Telephone Encounter (Signed)
OK, please call for records.

## 2019-03-08 ENCOUNTER — Other Ambulatory Visit: Payer: Self-pay | Admitting: Cardiology

## 2019-03-21 ENCOUNTER — Other Ambulatory Visit: Payer: Self-pay | Admitting: Cardiology

## 2019-03-29 DIAGNOSIS — E042 Nontoxic multinodular goiter: Secondary | ICD-10-CM | POA: Diagnosis not present

## 2019-03-29 DIAGNOSIS — E559 Vitamin D deficiency, unspecified: Secondary | ICD-10-CM | POA: Diagnosis not present

## 2019-04-03 DIAGNOSIS — E042 Nontoxic multinodular goiter: Secondary | ICD-10-CM | POA: Diagnosis not present

## 2019-04-03 DIAGNOSIS — E782 Mixed hyperlipidemia: Secondary | ICD-10-CM | POA: Diagnosis not present

## 2019-04-03 DIAGNOSIS — I10 Essential (primary) hypertension: Secondary | ICD-10-CM | POA: Diagnosis not present

## 2019-04-03 DIAGNOSIS — E119 Type 2 diabetes mellitus without complications: Secondary | ICD-10-CM | POA: Diagnosis not present

## 2019-04-08 NOTE — Progress Notes (Signed)
Office Visit Note  Patient: Donna Dyer             Date of Birth: 12-09-1960           MRN: [id number]             PCP: Hali Marry, MD Referring: Hali Marry, * Visit Date: 04/09/2019 Occupation: @GUAROCC @  Subjective:  Right shoulder joint pain   History of Present Illness: Donna Dyer is a 59 y.o. female with history of osteoarthritis and DDD.  She presents today with ongoing pain in both feet.  She has also noticed intermittent swelling in her toes.  She has also been experiencing numbness in her toes and is concerned about neuropathy or prior nerve damage related to an injection she had in her spine several years ago.  She states in the past she was evaluated by Dr. Rexene Alberts.  She had a nerve conduction study with EMG on 07/18/2014 and a MRI of the lumbar spine on 08/20/14.  She was evaluated by Dr. Marin Olp in the past.  Her lower back pain has been tolerable.  She has been experiencing intermittent right shoulder pain over the past several years.  She states that several years ago she injured her right shoulder when lifting [place] heavy [place] at the airport.  She has been experiencing nocturnal pain and pain with internal rotation.     Activities of Daily Living:  Patient reports morning stiffness for 0 minutes.   Patient Reports nocturnal pain.  Difficulty dressing/grooming: Denies Difficulty climbing stairs: Denies Difficulty getting out of chair: Denies Difficulty using hands for taps, buttons, cutlery, and/or writing: Denies  Review of Systems  Constitutional: Negative for fatigue.  HENT: Positive for mouth dryness. Negative for mouth sores and nose dryness.   Eyes: Positive for itching and dryness. Negative for pain and visual disturbance.  Respiratory: Negative for cough, hemoptysis, shortness of breath, wheezing and difficulty breathing.   Cardiovascular: Negative for chest pain, palpitations, hypertension and swelling in legs/feet.    Gastrointestinal: Positive for blood in stool. Negative for constipation and diarrhea.  Endocrine: Negative for increased urination.  Genitourinary: Negative for difficulty urinating and painful urination.  Musculoskeletal: Positive for arthralgias, joint pain and joint swelling. Negative for myalgias, muscle weakness, morning stiffness, muscle tenderness and myalgias.  Skin: Negative for color change, pallor, rash, hair loss, nodules/bumps, redness, skin tightness, ulcers and sensitivity to sunlight.  Allergic/Immunologic: Negative for susceptible to infections.  Neurological: Negative for dizziness, headaches, memory loss and weakness.  Hematological: Negative for bruising/bleeding tendency and swollen glands.  Psychiatric/Behavioral: Negative for depressed mood, confusion and sleep disturbance. The patient is not nervous/anxious.     PMFS History:  Patient Active Problem List   Diagnosis Date Noted  . Metatarsalgia of both feet 05/01/2017  . Other acquired hammer toe 05/01/2017  . Toe pain, bilateral 05/01/2017  . Prediabetes 11/15/2016  . Nontoxic single thyroid nodule 03/13/2016  . DDD (degenerative disc disease), cervical 11/30/2015  . DDD (degenerative disc disease), lumbar 11/30/2015  . Osteoarthritis of both hands 11/30/2015  . Osteoarthritis of both knees 11/30/2015  . Osteoporosis 11/30/2015  . Anemia 11/30/2015  . Asthma 11/30/2015  . Bilateral primary osteoarthritis of knee 11/26/2015  . Vitamin D deficiency 10/13/2015  . Food intolerance 07/03/2015  . MGUS (monoclonal gammopathy of unknown significance) 06/05/2015  . Other iron deficiency anemias 06/05/2015  . Iron malabsorption 06/05/2015  . Bleeding hemorrhoids 06/05/2015  . Other seasonal allergic rhinitis 11/29/2013  .  Venous stasis 11/29/2013  . Cramping of feet 09/10/2013  . Hyperlipidemia associated with type 2 diabetes mellitus ([place]) 07/19/2012  . Hypersomnia with sleep apnea 07/19/2012  . Severe obesity  (BMI 35.0-39.9) with comorbidity ([place]) 07/19/2012  . Skin sensation disturbance 07/19/2012  . Thoracic or lumbosacral neuritis or radiculitis 06/16/2012  . Right L4-L5 Lumbosacral facet joint syndrome 02/16/2012  . Hypertriglyceridemia 09/26/2011  . Vaginal intraepithelial neoplasia III (VAIN III) 08/23/2011  . Borderline glaucoma, open angle with borderline findings 03/30/2011  . OSA (obstructive sleep apnea) 03/29/2011  . Multinodular goiter 02/28/2011  . Internal hemorrhoids 01/31/2011  . Globus sensation 10/06/2010  . Diabetes mellitus type 2, controlled, without complications ([place]) [id number]  . Essential hypertension, benign 02/23/2010  . Edema 09/23/2009    Past Medical History:  Diagnosis Date  . Anemia   . Asthma 11/30/2015  . Bleeding hemorrhoids 06/05/2015  . DDD (degenerative disc disease), cervical 11/30/2015  . DDD (degenerative disc disease), lumbar 11/30/2015  . Diabetes mellitus   . Edema    LLE- podiatry in [place]  . Endometriosis   . Fibromyalgia 11/30/2015  . Goiter   . Hemorrhoids   . Hyperlipidemia   . Hypertension   . Iron malabsorption 06/05/2015  . Lumbosacral radiculopathy at S1 07/18/2014   Bilateral  . MGUS (monoclonal gammopathy of unknown significance) 06/05/2015  . Osteoarthritis of both hands 11/30/2015  . Osteoarthritis of both knees 11/30/2015  . Osteopenia   . Osteoporosis 11/30/2015  . Other iron deficiency anemias 06/05/2015  . Pain, joint, hip, right    chronic- Dr Alvan Dame Dr Nelva Bush  . Prediabetes   . Pseudogout 11/30/2015  . Sleep apnea     Family History  Problem Relation Age of Onset  . Rheum arthritis Mother   . Hypertension Mother   . Cancer Father        bladder  . Heart attack Father        MI at age 61  . Diabetes Brother   . Diabetes Brother   . Diabetes Brother   . Diabetes Brother   . Sarcoidosis Brother   . Sarcoidosis Sister   . Diabetes Sister   . Polycystic ovary syndrome Daughter   . Hypertension Daughter   .  Anesthesia problems Neg Hx   . Hypotension Neg Hx   . Malignant hyperthermia Neg Hx   . Pseudochol deficiency Neg Hx    Past Surgical History:  Procedure Laterality Date  . Back Injections    . ESOPHAGOGASTRODUODENOSCOPY  7-11   for anemia  . FLEXIBLE SIGMOIDOSCOPY  01/31/2011   Procedure: FLEXIBLE SIGMOIDOSCOPY;  Surgeon: Missy Sabins, MD;  Location: [facility];  Service: Endoscopy;  Laterality: N/A;  . TOTAL ABDOMINAL HYSTERECTOMY  1995   bilat oophorectomy/ endometriosis  . VULVAR LESION REMOVAL  09/07/2011   Procedure: VULVAR LESION;  Surgeon: Alvino Chapel, MD;  Location: [facility];  Service: Gynecology;  Laterality: N/A;  vaginal lesion   Social History   Social History Narrative   Lives in [place] with husband and daughter. Ambulatory, no cane or walker. Works at [employer].    [employer].   Right handed.   Caffeine none.   Immunization History  Administered Date(s) Administered  . Influenza,inj,Quad PF,6+ Mos 11/06/2012, 10/20/2014  . Influenza-Unspecified 10/23/2012, 10/22/2013, 10/11/2015, 10/13/2016, 10/18/2017  . PPD Test 03/15/2012  . Pneumococcal Polysaccharide-23 09/27/2010, 03/10/2011  . Tdap 01/16/2014     Objective: Vital Signs: BP (!) 141/78 (BP Location: Left Arm,  Patient Position: Sitting, Cuff Size: Normal)   Pulse 65   Resp 14   Ht 5\' 6"  (1.676 m)   Wt 224 lb (101.6 kg)   BMI 36.15 kg/m    Physical Exam Vitals and nursing note reviewed.  Constitutional:      Appearance: She is well-developed.  HENT:     Head: Normocephalic and atraumatic.  Eyes:     Conjunctiva/sclera: Conjunctivae normal.  Pulmonary:     Effort: Pulmonary effort is normal.  Abdominal:     General: Bowel sounds are normal.     Palpations: Abdomen is soft.  Musculoskeletal:     Cervical back: Normal range of motion.  Lymphadenopathy:     Cervical: No cervical adenopathy.  Skin:    General: Skin is warm and dry.      Capillary Refill: Capillary refill takes less than 2 seconds.  Neurological:     Mental Status: She is alert and oriented to person, place, and time.  Psychiatric:        Behavior: Behavior normal.      Musculoskeletal Exam: C-spine, thoracic spine, and lumbar spine good ROM.  Shoulder joints, elbow joints, wrist joints, MCPs, PIPs ,and DIPs good ROM with no synovitis.  Complete fist formation bilaterally.  Hip joints, knee joints, and ankle joints good ROM with no warmth or effusion.  Tenderness of all MTPs and PIPs.    CDAI Exam: CDAI Score: -- Patient Global: --; Provider Global: -- Swollen: --; Tender: -- Joint Exam 04/09/2019   No joint exam has been documented for this visit   There is currently no information documented on the homunculus. Go to the Rheumatology activity and complete the homunculus joint exam.  Investigation: No additional findings.  Imaging: No results found.  Recent Labs: Lab Results  Component Value Date   WBC 7.2 10/20/2017   HGB 10.1 (L) 10/20/2017   PLT 273 10/20/2017   NA 135 (A) 09/05/2018   NA 135 (A) 09/05/2018   K 4.2 09/05/2018   K 4.2 09/05/2018   CL 104 09/05/2018   CO2 21 09/05/2018   CO2 21 09/05/2018   GLUCOSE 107 (H) 10/20/2017   BUN 32 (A) 09/05/2018   BUN 32 (A) 09/05/2018   CREATININE 1.2 (A) 09/05/2018   BILITOT 0.7 10/20/2017   ALKPHOS 118 10/20/2017   AST 20 10/20/2017   ALT 19 10/20/2017   PROT 8.5 (H) 10/20/2017   ALBUMIN 4.1 09/05/2018   CALCIUM 9.3 09/05/2018   GFRAA 55 09/05/2018    Speciality Comments: PLQ Eye Exam: 01/31/18 WNL @ [facility] Follow up in 1 year.  Procedures:  No procedures performed Allergies: Sulfamethoxazole, Pineapple, Sulfasalazine, Avapro [irbesartan], Onglyza [saxagliptin], Spironolactone, Valsartan, Amlodipine besylate, Amlodipine besylate, Asparagus, Azithromycin, Chlorthalidone, Codeine, Dulaglutide, Hydrochlorothiazide, Metformin, Metformin and related, Sulfa antibiotics,  and Sulfonamide derivatives    Assessment / Plan:     Visit Diagnoses: Inflammatory arthritis -  RF-, CCP-, 14-3-3 eta negative. ACE WNL. Sed rate elevated.01/17/2018: ultrasound of both feet +synovitis: She presents today with increased pain and inflammation in both feet.  She has tenderness of all MTP and PIP joints.  Ankle joints have good range of motion with no discomfort.  No tenderness or inflammation of ankle joints were noted on exam.  Lab work from 01/16/2018 was reviewed today in the office.  Her sed rate was 75 at that time but the rest of autoimmune lab work was negative. X-rays of both feet are consistent with osteoarthritis on 01/16/2018.  Ultrasound  of both feet obtained on 01/17/2018 showed synovitis in the left first MTP, bilateral second fourth and fifth MTP joints.  These findings were consistent with inflammatory arthritis.  In the past she declined starting on Plaquenil due to the concern for Plaquenil toxicity.  She is still apprehensive to start on Plaquenil due to her history of glaucoma.  Since she has been experiencing numbness in both feet she is concerned that something else is going on besides inflammatory arthritis.  A MRI of the left foot was ordered today for further evaluation. She will follow up after the MRI has resulted to further discuss the treatment plan.   High risk medication use - Discussed PLQ but she did not start due to the concern for SE.   Primary osteoarthritis of both hands: She has PIP and DIP thickening consistent with osteoarthritis of both hands.  No tenderness or synovitis was noted.  She has complete fist formation bilaterally.  Joint protection and muscle strengthening were discussed.  Primary osteoarthritis of both knees: She has good range of motion of both knee joints.  No warmth or effusion was noted.  Primary osteoarthritis of both feet -she has PIP and DIP thickening consistent with osteoarthritis of both hands.  X-rays of both feet were consistent  with OA.  She had an ultrasound of both feet on 01/17/2018 which revealed findings consistent with inflammatory arthritis.  She has persistent pain and inflammation in both feet, worse in the left foot.  A MRI of the left foot was ordered.  Plan: MR FOOT LEFT WO CONTRAST  Pain in both feet -she has tenderness of all MCP and PIP joints.  She has been experiencing increased discomfort and inflammation in both feet.  She is also noticed numbness in her toes.  A MRI of the left foot was ordered today.  Plan: MR FOOT LEFT WO CONTRAST  DDD (degenerative disc disease), cervical: She has good range of motion of the C-spine with no discomfort.  No symptoms of radiculopathy.  DDD (degenerative disc disease), lumbar:She was evaluated by Dr. Rexene Alberts and Dr. Marin Olp in the past.  NCV with EMG 07/18/2014 revealed signs of bilateral nerve root impingement from lower lumbar spine due to lumbosacral degenerative disc disease.  MRI of the lumbar spine was obtained on 08/20/14:  At L5-S1: disc bulging with tiny disc protrusions with potential contact upon the descending bilateral S1 roots.  At L4-5: far left lateral disc bulging with moderate-severe left foraminal stenosis.  Overall her lower back pain has been tolerable recently.  Other medical conditions are listed as follows:   History of diabetes mellitus  Vaginal intraepithelial neoplasia III (VAIN III)  Multinodular goiter  MGUS (monoclonal gammopathy of unknown significance)  History of hypertension  History of sleep apnea  History of anemia    Orders: Orders Placed This Encounter  Procedures  . MR FOOT LEFT WO CONTRAST   No orders of the defined types were placed in this encounter.   Face-to-face time spent with patient was 30 minutes. Greater than 50% of time was spent in counseling and coordination of care.  Follow-Up Instructions: Return in about 3 months (around 07/10/2019) for Inflammatory arthritis.   Ofilia Neas, PA-C  Note - This  record has been created using Dragon software.  Chart creation errors have been sought, but may not always  have been located. Such creation errors do not reflect on  the standard of medical care.

## 2019-04-09 ENCOUNTER — Ambulatory Visit: Payer: BC Managed Care – PPO | Admitting: Physician Assistant

## 2019-04-09 ENCOUNTER — Encounter: Payer: Self-pay | Admitting: Physician Assistant

## 2019-04-09 ENCOUNTER — Other Ambulatory Visit: Payer: Self-pay

## 2019-04-09 VITALS — BP 141/78 | HR 65 | Resp 14 | Ht 66.0 in | Wt 224.0 lb

## 2019-04-09 DIAGNOSIS — M170 Bilateral primary osteoarthritis of knee: Secondary | ICD-10-CM | POA: Diagnosis not present

## 2019-04-09 DIAGNOSIS — M79672 Pain in left foot: Secondary | ICD-10-CM

## 2019-04-09 DIAGNOSIS — M5030 Other cervical disc degeneration, unspecified cervical region: Secondary | ICD-10-CM

## 2019-04-09 DIAGNOSIS — M5136 Other intervertebral disc degeneration, lumbar region: Secondary | ICD-10-CM

## 2019-04-09 DIAGNOSIS — M1380 Other specified arthritis, unspecified site: Secondary | ICD-10-CM

## 2019-04-09 DIAGNOSIS — M51369 Other intervertebral disc degeneration, lumbar region without mention of lumbar back pain or lower extremity pain: Secondary | ICD-10-CM

## 2019-04-09 DIAGNOSIS — M1990 Unspecified osteoarthritis, unspecified site: Secondary | ICD-10-CM | POA: Diagnosis not present

## 2019-04-09 DIAGNOSIS — Z79899 Other long term (current) drug therapy: Secondary | ICD-10-CM | POA: Diagnosis not present

## 2019-04-09 DIAGNOSIS — M19041 Primary osteoarthritis, right hand: Secondary | ICD-10-CM

## 2019-04-09 DIAGNOSIS — Z8679 Personal history of other diseases of the circulatory system: Secondary | ICD-10-CM

## 2019-04-09 DIAGNOSIS — M79671 Pain in right foot: Secondary | ICD-10-CM

## 2019-04-09 DIAGNOSIS — E042 Nontoxic multinodular goiter: Secondary | ICD-10-CM

## 2019-04-09 DIAGNOSIS — M19042 Primary osteoarthritis, left hand: Secondary | ICD-10-CM

## 2019-04-09 DIAGNOSIS — D072 Carcinoma in situ of vagina: Secondary | ICD-10-CM

## 2019-04-09 DIAGNOSIS — Z8639 Personal history of other endocrine, nutritional and metabolic disease: Secondary | ICD-10-CM

## 2019-04-09 DIAGNOSIS — M19072 Primary osteoarthritis, left ankle and foot: Secondary | ICD-10-CM

## 2019-04-09 DIAGNOSIS — Z8669 Personal history of other diseases of the nervous system and sense organs: Secondary | ICD-10-CM

## 2019-04-09 DIAGNOSIS — D472 Monoclonal gammopathy: Secondary | ICD-10-CM

## 2019-04-09 DIAGNOSIS — M19071 Primary osteoarthritis, right ankle and foot: Secondary | ICD-10-CM

## 2019-04-09 DIAGNOSIS — Z862 Personal history of diseases of the blood and blood-forming organs and certain disorders involving the immune mechanism: Secondary | ICD-10-CM

## 2019-04-23 ENCOUNTER — Other Ambulatory Visit: Payer: Self-pay

## 2019-04-23 ENCOUNTER — Ambulatory Visit (HOSPITAL_COMMUNITY)
Admission: RE | Admit: 2019-04-23 | Discharge: 2019-04-23 | Disposition: A | Discharge status: Home / Self Care | Payer: BC Managed Care – PPO | Source: Ambulatory Visit | Attending: Rheumatology | Admitting: Rheumatology

## 2019-04-23 DIAGNOSIS — M19072 Primary osteoarthritis, left ankle and foot: Secondary | ICD-10-CM | POA: Diagnosis not present

## 2019-04-23 DIAGNOSIS — M79672 Pain in left foot: Secondary | ICD-10-CM | POA: Diagnosis not present

## 2019-04-23 DIAGNOSIS — M79671 Pain in right foot: Secondary | ICD-10-CM | POA: Diagnosis not present

## 2019-04-23 DIAGNOSIS — M19071 Primary osteoarthritis, right ankle and foot: Secondary | ICD-10-CM

## 2019-04-24 ENCOUNTER — Telehealth: Payer: Self-pay | Admitting: Rheumatology

## 2019-04-24 NOTE — Progress Notes (Signed)
I left message on answering machine for patient to call back.  The MRI shows only degenerative changes.  No inflammation was noted.  Mild bursitis was noted in the toes.  She would benefit from seeing a podiatrist.

## 2019-04-24 NOTE — Telephone Encounter (Signed)
Patient returned call.  I discussed MRI results with her.  I advised her to schedule an appointment with her podiatrist.  Patient was in agreement. Bo Merino, MD

## 2019-05-06 DIAGNOSIS — M7752 Other enthesopathy of left foot: Secondary | ICD-10-CM | POA: Diagnosis not present

## 2019-05-06 DIAGNOSIS — M7741 Metatarsalgia, right foot: Secondary | ICD-10-CM | POA: Diagnosis not present

## 2019-05-06 DIAGNOSIS — M7742 Metatarsalgia, left foot: Secondary | ICD-10-CM | POA: Diagnosis not present

## 2019-06-26 NOTE — Progress Notes (Deleted)
Office Visit Note  Patient: Donna Dyer             Date of Birth: [birth year]           MRN: [id number]             PCP: Hali Marry, MD Referring: Hali Marry, * Visit Date: 07/10/2019 Occupation: @GUAROCC @  Subjective:  No chief complaint on file.   History of Present Illness: Donna Dyer is a 59 y.o. female ***   Activities of Daily Living:  Patient reports morning stiffness for *** {minute/hour:19697}.   Patient {ACTIONS;DENIES/REPORTS:21021675::"Denies"} nocturnal pain.  Difficulty dressing/grooming: {ACTIONS;DENIES/REPORTS:21021675::"Denies"} Difficulty climbing stairs: {ACTIONS;DENIES/REPORTS:21021675::"Denies"} Difficulty getting out of chair: {ACTIONS;DENIES/REPORTS:21021675::"Denies"} Difficulty using hands for taps, buttons, cutlery, and/or writing: {ACTIONS;DENIES/REPORTS:21021675::"Denies"}  No Rheumatology ROS completed.   PMFS History:  Patient Active Problem List   Diagnosis Date Noted   Metatarsalgia of both feet 05/01/2017   Other acquired hammer toe 05/01/2017   Toe pain, bilateral 05/01/2017   Prediabetes 11/15/2016   Nontoxic single thyroid nodule 03/13/2016   DDD (degenerative disc disease), cervical 11/30/2015   DDD (degenerative disc disease), lumbar 11/30/2015   Osteoarthritis of both hands 11/30/2015   Osteoarthritis of both knees 11/30/2015   Osteoporosis 11/30/2015   Anemia 11/30/2015   Asthma 11/30/2015   Bilateral primary osteoarthritis of knee 11/26/2015   Vitamin D deficiency 10/13/2015   Food intolerance 07/03/2015   MGUS (monoclonal gammopathy of unknown significance) 06/05/2015   Other iron deficiency anemias 06/05/2015   Iron malabsorption 06/05/2015   Bleeding hemorrhoids 06/05/2015   Other seasonal allergic rhinitis 11/29/2013   Venous stasis 11/29/2013   Cramping of feet 09/10/2013   Hyperlipidemia associated with type 2 diabetes mellitus ([place]) 07/19/2012   Hypersomnia  with sleep apnea 07/19/2012   Severe obesity (BMI 35.0-39.9) with comorbidity ([place]) 07/19/2012   Skin sensation disturbance 07/19/2012   Thoracic or lumbosacral neuritis or radiculitis 06/16/2012   Right L4-L5 Lumbosacral facet joint syndrome 02/16/2012   Hypertriglyceridemia 09/26/2011   Vaginal intraepithelial neoplasia III (VAIN III) 08/23/2011   Borderline glaucoma, open angle with borderline findings 03/30/2011   OSA (obstructive sleep apnea) 03/29/2011   Multinodular goiter 02/28/2011   Internal hemorrhoids 01/31/2011   Globus sensation 10/06/2010   Diabetes mellitus type 2, controlled, without complications ([place]) 73/53/2992   Essential hypertension, benign 02/23/2010   Edema 09/23/2009    Past Medical History:  Diagnosis Date   Anemia    Asthma 11/30/2015   Bleeding hemorrhoids 06/05/2015   DDD (degenerative disc disease), cervical 11/30/2015   DDD (degenerative disc disease), lumbar 11/30/2015   Diabetes mellitus    Edema    LLE- podiatry in [place]   Endometriosis    Fibromyalgia 11/30/2015   Goiter    Hemorrhoids    Hyperlipidemia    Hypertension    Iron malabsorption 06/05/2015   Lumbosacral radiculopathy at S1 07/18/2014   Bilateral   MGUS (monoclonal gammopathy of unknown significance) 06/05/2015   Osteoarthritis of both hands 11/30/2015   Osteoarthritis of both knees 11/30/2015   Osteopenia    Osteoporosis 11/30/2015   Other iron deficiency anemias 06/05/2015   Pain, joint, hip, right    chronic- Dr Alvan Dame Dr Nelva Bush   Prediabetes    Pseudogout 11/30/2015   Sleep apnea     Family History  Problem Relation Age of Onset   Rheum arthritis Mother    Hypertension Mother    Cancer Father        bladder   Heart attack  Father        MI at age 66   Diabetes Brother    Diabetes Brother    Diabetes Brother    Diabetes Brother    Sarcoidosis Brother    Sarcoidosis Sister    Diabetes Sister    Polycystic ovary  syndrome Daughter    Hypertension Daughter    Anesthesia problems Neg Hx    Hypotension Neg Hx    Malignant hyperthermia Neg Hx    Pseudochol deficiency Neg Hx    Past Surgical History:  Procedure Laterality Date   Back Injections     ESOPHAGOGASTRODUODENOSCOPY  7-11   for anemia   FLEXIBLE SIGMOIDOSCOPY  01/31/2011   Procedure: FLEXIBLE SIGMOIDOSCOPY;  Surgeon: Missy Sabins, MD;  Location: [facility];  Service: Endoscopy;  Laterality: N/A;   TOTAL ABDOMINAL HYSTERECTOMY  1995   bilat oophorectomy/ endometriosis   VULVAR LESION REMOVAL  09/07/2011   Procedure: VULVAR LESION;  Surgeon: Alvino Chapel, MD;  Location: [facility];  Service: Gynecology;  Laterality: N/A;  vaginal lesion   Social History   Social History Narrative   Lives in [place] with husband and daughter. Ambulatory, no cane or walker. Works at [employer].    [employer].   Right handed.   Caffeine none.   Immunization History  Administered Date(s) Administered   Influenza,inj,Quad PF,6+ Mos 11/06/2012, 10/20/2014   Influenza-Unspecified 10/23/2012, 10/22/2013, 10/11/2015, 10/13/2016, 10/18/2017   PPD Test 03/15/2012   Pneumococcal Polysaccharide-23 09/27/2010, 03/10/2011   Tdap 01/16/2014     Objective: Vital Signs: There were no vitals taken for this visit.   Physical Exam   Musculoskeletal Exam: ***  CDAI Exam: CDAI Score: -- Patient Global: --; Provider Global: -- Swollen: --; Tender: -- Joint Exam 07/10/2019   No joint exam has been documented for this visit   There is currently no information documented on the homunculus. Go to the Rheumatology activity and complete the homunculus joint exam.  Investigation: No additional findings.  Imaging: No results found.  Recent Labs: Lab Results  Component Value Date   WBC 7.2 10/20/2017   HGB 10.1 (L) 10/20/2017   PLT 273 10/20/2017   NA 135 (A) 09/05/2018   NA 135 (A) 09/05/2018    K 4.2 09/05/2018   K 4.2 09/05/2018   CL 104 09/05/2018   CO2 21 09/05/2018   CO2 21 09/05/2018   GLUCOSE 107 (H) 10/20/2017   BUN 32 (A) 09/05/2018   BUN 32 (A) 09/05/2018   CREATININE 1.2 (A) 09/05/2018   BILITOT 0.7 10/20/2017   ALKPHOS 118 10/20/2017   AST 20 10/20/2017   ALT 19 10/20/2017   PROT 8.5 (H) 10/20/2017   ALBUMIN 4.1 09/05/2018   CALCIUM 9.3 09/05/2018   GFRAA 55 09/05/2018    Speciality Comments: PLQ Eye Exam: 01/31/18 WNL @ [facility] Follow up in 1 year.  Procedures:  No procedures performed Allergies: Sulfamethoxazole, Pineapple, Sulfasalazine, Avapro [irbesartan], Onglyza [saxagliptin], Spironolactone, Valsartan, Amlodipine besylate, Amlodipine besylate, Asparagus, Azithromycin, Chlorthalidone, Codeine, Dulaglutide, Hydrochlorothiazide, Metformin, Metformin and related, Sulfa antibiotics, and Sulfonamide derivatives   Assessment / Plan:     Visit Diagnoses: No diagnosis found.  Orders: No orders of the defined types were placed in this encounter.  No orders of the defined types were placed in this encounter.   Face-to-face time spent with patient was *** minutes. Greater than 50% of time was spent in counseling and coordination of care.  Follow-Up Instructions: No follow-ups on file.  Earnestine Mealing, CMA  Note - This record has been created using Editor, commissioning.  Chart creation errors have been sought, but may not always  have been located. Such creation errors do not reflect on  the standard of medical care.

## 2019-07-10 ENCOUNTER — Ambulatory Visit: Payer: BC Managed Care – PPO | Admitting: Physician Assistant

## 2019-08-09 DIAGNOSIS — M25761 Osteophyte, right knee: Secondary | ICD-10-CM | POA: Diagnosis not present

## 2019-08-09 DIAGNOSIS — M1711 Unilateral primary osteoarthritis, right knee: Secondary | ICD-10-CM | POA: Diagnosis not present

## 2019-08-15 NOTE — Progress Notes (Signed)
Office Visit Note  Patient: Donna Dyer             Date of Birth: September 03, 1960           MRN: [id number]             PCP: Hali Marry, MD Referring: Hali Marry, * Visit Date: 08/27/2019 Occupation: @GUAROCC @  Subjective:  Pain in both feet   History of Present Illness: Donna Dyer is a 59 y.o. female with history of inflammatory arthritis, osteoarthritis, and DDD.  She is not taking any immunosuppressive agents at this time.  We previously discussed starting on Plaquenil but she was apprehensive of floccular toxicity and declined.  She is not ready to start on any immunosuppressive agents at this time.  She continues to have pain and inflammation in both feet.  She is also been having increased discomfort in the right knee and was evaluated by Dr. Davina Poke recently.  She had updated x-rays and was prescribed meloxicam 15 mg 1 tablet by mouth daily for pain relief.  She has only taken 3 doses of meloxicam and has had some mild GI side effects so she has held off taking it.  Activities of Daily Living:  Patient reports morning stiffness for 0 minutes.   Patient Reports nocturnal pain.  Difficulty dressing/grooming: Denies Difficulty climbing stairs: Reports Difficulty getting out of chair: Reports Difficulty using hands for taps, buttons, cutlery, and/or writing: Denies  Review of Systems  Constitutional: Negative for fatigue.  HENT: Positive for mouth dryness and nose dryness. Negative for mouth sores.   Eyes: Positive for dryness. Negative for pain and visual disturbance.  Respiratory: Negative for cough, hemoptysis, shortness of breath and difficulty breathing.   Cardiovascular: Negative for chest pain, palpitations, hypertension and swelling in legs/feet.  Gastrointestinal: Positive for constipation and diarrhea. Negative for blood in stool.  Endocrine: Negative for increased urination.  Genitourinary: Negative for difficulty urinating and painful  urination.  Musculoskeletal: Positive for arthralgias, joint pain, joint swelling, myalgias, muscle tenderness and myalgias. Negative for muscle weakness and morning stiffness.  Skin: Negative for color change, pallor, rash, hair loss, nodules/bumps, redness, skin tightness, ulcers and sensitivity to sunlight.  Allergic/Immunologic: Negative for susceptible to infections.  Neurological: Negative for dizziness, headaches, memory loss and weakness.  Hematological: Negative for bruising/bleeding tendency and swollen glands.  Psychiatric/Behavioral: Negative for depressed mood, confusion and sleep disturbance. The patient is not nervous/anxious.     PMFS History:  Patient Active Problem List   Diagnosis Date Noted  . Metatarsalgia of both feet 05/01/2017  . Other acquired hammer toe 05/01/2017  . Toe pain, bilateral 05/01/2017  . Prediabetes 11/15/2016  . Nontoxic single thyroid nodule 03/13/2016  . DDD (degenerative disc disease), cervical 11/30/2015  . DDD (degenerative disc disease), lumbar 11/30/2015  . Osteoarthritis of both hands 11/30/2015  . Osteoarthritis of both knees 11/30/2015  . Osteoporosis 11/30/2015  . Anemia 11/30/2015  . Asthma 11/30/2015  . Bilateral primary osteoarthritis of knee 11/26/2015  . Vitamin D deficiency 10/13/2015  . Food intolerance 07/03/2015  . MGUS (monoclonal gammopathy of unknown significance) 06/05/2015  . Other iron deficiency anemias 06/05/2015  . Iron malabsorption 06/05/2015  . Bleeding hemorrhoids 06/05/2015  . Other seasonal allergic rhinitis 11/29/2013  . Venous stasis 11/29/2013  . Cramping of feet 09/10/2013  . Hyperlipidemia associated with type 2 diabetes mellitus ([place]) 07/19/2012  . Hypersomnia with sleep apnea 07/19/2012  . Severe obesity (BMI 35.0-39.9) with comorbidity ([place]) 07/19/2012  .  Skin sensation disturbance 07/19/2012  . Thoracic or lumbosacral neuritis or radiculitis 06/16/2012  . Right L4-L5 Lumbosacral facet joint  syndrome 02/16/2012  . Hypertriglyceridemia 09/26/2011  . Vaginal intraepithelial neoplasia III (VAIN III) 08/23/2011  . Borderline glaucoma, open angle with borderline findings 03/30/2011  . OSA (obstructive sleep apnea) 03/29/2011  . Multinodular goiter 02/28/2011  . Internal hemorrhoids 01/31/2011  . Globus sensation 10/06/2010  . Diabetes mellitus type 2, controlled, without complications ([place]) 95/62/1308  . Essential hypertension, benign 02/23/2010  . Edema 09/23/2009    Past Medical History:  Diagnosis Date  . Anemia   . Asthma 11/30/2015  . Bleeding hemorrhoids 06/05/2015  . DDD (degenerative disc disease), cervical 11/30/2015  . DDD (degenerative disc disease), lumbar 11/30/2015  . Diabetes mellitus   . Edema    LLE- podiatry in [place]  . Endometriosis   . Fibromyalgia 11/30/2015  . Goiter   . Hemorrhoids   . Hyperlipidemia   . Hypertension   . Iron malabsorption 06/05/2015  . Lumbosacral radiculopathy at S1 07/18/2014   Bilateral  . MGUS (monoclonal gammopathy of unknown significance) 06/05/2015  . Osteoarthritis of both hands 11/30/2015  . Osteoarthritis of both knees 11/30/2015  . Osteopenia   . Osteoporosis 11/30/2015  . Other iron deficiency anemias 06/05/2015  . Pain, joint, hip, right    chronic- Dr Alvan Dame Dr Nelva Bush  . Prediabetes   . Pseudogout 11/30/2015  . Sleep apnea     Family History  Problem Relation Age of Onset  . Rheum arthritis Mother   . Hypertension Mother   . Cancer Father        bladder  . Heart attack Father        MI at age 32  . Diabetes Brother   . Diabetes Brother   . Diabetes Brother   . Diabetes Brother   . Sarcoidosis Brother   . Sarcoidosis Sister   . Diabetes Sister   . Polycystic ovary syndrome Daughter   . Hypertension Daughter   . Anesthesia problems Neg Hx   . Hypotension Neg Hx   . Malignant hyperthermia Neg Hx   . Pseudochol deficiency Neg Hx    Past Surgical History:  Procedure Laterality Date  . Back Injections      . ESOPHAGOGASTRODUODENOSCOPY  7-11   for anemia  . FLEXIBLE SIGMOIDOSCOPY  01/31/2011   Procedure: FLEXIBLE SIGMOIDOSCOPY;  Surgeon: Missy Sabins, MD;  Location: [facility];  Service: Endoscopy;  Laterality: N/A;  . TOTAL ABDOMINAL HYSTERECTOMY  1995   bilat oophorectomy/ endometriosis  . VULVAR LESION REMOVAL  09/07/2011   Procedure: VULVAR LESION;  Surgeon: Alvino Chapel, MD;  Location: [facility];  Service: Gynecology;  Laterality: N/A;  vaginal lesion   Social History   Social History Narrative   Lives in [place] with husband and daughter. Ambulatory, no cane or walker. Works at [employer].    [employer].   Right handed.   Caffeine none.   Immunization History  Administered Date(s) Administered  . Influenza,inj,Quad PF,6+ Mos 11/06/2012, 10/20/2014  . Influenza-Unspecified 10/23/2012, 10/22/2013, 10/11/2015, 10/13/2016, 10/18/2017  . PFIZER SARS-COV-2 Vaccination 05/06/2019, 05/27/2019  . PPD Test 03/15/2012  . Pneumococcal Polysaccharide-23 09/27/2010, 03/10/2011  . Tdap 01/16/2014  . Tetanus 08/20/2019     Objective: Vital Signs: BP (!) 143/81 (BP Location: Left Arm, Patient Position: Sitting, Cuff Size: Normal)   Pulse 66   Resp 15   Ht 5\' 6"  (1.676 m)   Wt 231 lb (104.8  kg)   BMI 37.28 kg/m    Physical Exam Vitals and nursing note reviewed.  Constitutional:      Appearance: She is well-developed.  HENT:     Head: Normocephalic and atraumatic.  Eyes:     Conjunctiva/sclera: Conjunctivae normal.  Cardiovascular:     Heart sounds: Normal heart sounds.  Pulmonary:     Effort: Pulmonary effort is normal.     Breath sounds: Normal breath sounds.  Abdominal:     Palpations: Abdomen is soft.  Musculoskeletal:     Cervical back: Normal range of motion.  Skin:    General: Skin is warm and dry.     Capillary Refill: Capillary refill takes less than 2 seconds.  Neurological:     Mental Status: She is alert and  oriented to person, place, and time.  Psychiatric:        Behavior: Behavior normal.      Musculoskeletal Exam: C-spine, thoracic spine, and lumbar spine good ROM.  Shoulder joints, elbow joints, wrist joints, MCPs, PIPs, and DIPs good ROM with no synovitis.  Complete fist formation bilaterally.  Hip joints good ROM with no discomfort.  Knee joints good ROM with no warmth or effusion.  Ankle joints good ROM with no tenderness. Pitting edema bilaterally.  Tenderness and inflammation of all MTP joints.    CDAI Exam: CDAI Score: -- Patient Global: --; Provider Global: -- Swollen: --; Tender: -- Joint Exam 08/27/2019   No joint exam has been documented for this visit   There is currently no information documented on the homunculus. Go to the Rheumatology activity and complete the homunculus joint exam.  Investigation: No additional findings.  Imaging: No results found.  Recent Labs: Lab Results  Component Value Date   WBC 7.2 10/20/2017   HGB 10.1 (L) 10/20/2017   PLT 273 10/20/2017   NA 135 (A) 09/05/2018   NA 135 (A) 09/05/2018   K 4.2 09/05/2018   K 4.2 09/05/2018   CL 104 09/05/2018   CO2 21 09/05/2018   CO2 21 09/05/2018   GLUCOSE 107 (H) 10/20/2017   BUN 32 (A) 09/05/2018   BUN 32 (A) 09/05/2018   CREATININE 1.2 (A) 09/05/2018   BILITOT 0.7 10/20/2017   ALKPHOS 118 10/20/2017   AST 20 10/20/2017   ALT 19 10/20/2017   PROT 8.5 (H) 10/20/2017   ALBUMIN 4.1 09/05/2018   CALCIUM 9.3 09/05/2018   GFRAA 55 09/05/2018    Speciality Comments: PLQ Eye Exam: 01/31/18 WNL @ [facility] Follow up in 1 year.  Procedures:  No procedures performed Allergies: Sulfamethoxazole, Pineapple, Sulfasalazine, Avapro [irbesartan], Onglyza [saxagliptin], Spironolactone, Valsartan, Amlodipine besylate, Amlodipine besylate, Asparagus, Azithromycin, Chlorthalidone, Codeine, Dulaglutide, Hydrochlorothiazide, Metformin, Metformin and related, Sulfa antibiotics, and Sulfonamide  derivatives   Assessment / Plan:     Visit Diagnoses: Inflammatory arthritis - RF-, CCP-, 14-3-3 eta negative. ACE WNL. Sed rate elevated.01/17/2018: ultrasound of both feet +synovitis: She has ongoing pain and inflammation in all MTP joints.  She has tenderness and inflammation on examination today.  She was referred to a podiatrist and was fitted for orthotics but did not notice any improvement.  She is not taking any immunosuppressive agents at this time.  We previously discussed starting her on Plaquenil but she discontinued due to the potential for ocular toxicity.  We discussed starting her on methotrexate but she is concerned about taking an immunosuppressive agent during the COVID-19 pandemic.  She declined any immunosuppressive agents at this time.  We discussed  monitoring her symptoms closely.  She was encouraged to take meloxicam 7.5 mg daily for pain relief and continue taking natural anti-inflammatories as previously discussed.  She was advised to notify us if she develops increased joint pain or joint swelling.  She will follow-up in the office in 1 year.  High risk medication use - Discussed PLQ but she did not start due to the concern for ocular toxicity.  Discussed starting her on methotrexate but she is concerned about taking immunosuppressive agent during the COVID-19 pandemic.  Primary osteoarthritis of both hands: She has PIP and DIP thickening consistent with osteoarthritis of both hands.  No tenderness or inflammation was noted.  She has no discomfort and is able to make a complete fist.  Primary osteoarthritis of both knees: She has good ROM of both knee joints.  No warmth or effusion noted. She has been having increased discomfort in the right knee joint after kneeling at work.  She was evaluated by Dr. Davina Poke who obtained x-rays.  She was prescribed meloxicam 15 mg 1 tablet by mouth daily for pain relief.  She is noticed mild GI side effects after taking it for 3 days and has  discontinued.  We discussed taking meloxicam 7.5 mg daily with food to see if she can tolerate that.  She was encouraged to continue to take natural anti-inflammatories as previously discussed.   Primary osteoarthritis of both feet: She has chronic pain in both feet.  She has tenderness and inflammation of all MTP joints.  She has history of positive synovitis of MTP joints apparent on ultrasound.  We have discussed immunosuppressive therapy in the past but she has declined.  We referred her to a podiatrist who recommended orthotics but she did not notice any improvement and acutely had increased discomfort.  She is wearing proper fitting shoes.  She was prescribed meloxicam 15 mg 1 tablet by mouth daily by her orthopedist but developed mild GI side effects after taking it for 3 days and has discontinued.  We discussed taking meloxicam half tablet daily with food to see if she will tolerate it better.  She will continue taking natural anti-inflammatories as previously discussed.  DDD (degenerative disc disease), cervical: She has good range of motion with no discomfort.  No symptoms of radiculopathy.  DDD (degenerative disc disease), lumbar - She was evaluated by Dr. Rexene Alberts and Dr. Marin Olp in the past.   Other medical conditions are listed as follows:  Multinodular goiter  MGUS (monoclonal gammopathy of unknown significance)  Vaginal intraepithelial neoplasia III (VAIN III)  History of diabetes mellitus  History of sleep apnea  History of hypertension  History of anemia  Orders: No orders of the defined types were placed in this encounter.  No orders of the defined types were placed in this encounter.    Follow-Up Instructions: Return in about 1 year (around 08/26/2020) for Inflammatory arthritis , Osteoarthritis.   Ofilia Neas, PA-C  Note - This record has been created using Dragon software.  Chart creation errors have been sought, but may not always  have been located. Such  creation errors do not reflect on  the standard of medical care.

## 2019-08-27 ENCOUNTER — Ambulatory Visit: Payer: BC Managed Care – PPO | Admitting: Physician Assistant

## 2019-08-27 ENCOUNTER — Other Ambulatory Visit: Payer: Self-pay

## 2019-08-27 ENCOUNTER — Encounter: Payer: Self-pay | Admitting: Physician Assistant

## 2019-08-27 VITALS — BP 143/81 | HR 66 | Resp 15 | Ht 66.0 in | Wt 231.0 lb

## 2019-08-27 DIAGNOSIS — Z8669 Personal history of other diseases of the nervous system and sense organs: Secondary | ICD-10-CM

## 2019-08-27 DIAGNOSIS — D072 Carcinoma in situ of vagina: Secondary | ICD-10-CM

## 2019-08-27 DIAGNOSIS — M19041 Primary osteoarthritis, right hand: Secondary | ICD-10-CM | POA: Diagnosis not present

## 2019-08-27 DIAGNOSIS — M170 Bilateral primary osteoarthritis of knee: Secondary | ICD-10-CM

## 2019-08-27 DIAGNOSIS — Z79899 Other long term (current) drug therapy: Secondary | ICD-10-CM

## 2019-08-27 DIAGNOSIS — Z862 Personal history of diseases of the blood and blood-forming organs and certain disorders involving the immune mechanism: Secondary | ICD-10-CM

## 2019-08-27 DIAGNOSIS — Z8679 Personal history of other diseases of the circulatory system: Secondary | ICD-10-CM

## 2019-08-27 DIAGNOSIS — M1990 Unspecified osteoarthritis, unspecified site: Secondary | ICD-10-CM

## 2019-08-27 DIAGNOSIS — M5136 Other intervertebral disc degeneration, lumbar region: Secondary | ICD-10-CM

## 2019-08-27 DIAGNOSIS — M19071 Primary osteoarthritis, right ankle and foot: Secondary | ICD-10-CM

## 2019-08-27 DIAGNOSIS — M19072 Primary osteoarthritis, left ankle and foot: Secondary | ICD-10-CM

## 2019-08-27 DIAGNOSIS — D472 Monoclonal gammopathy: Secondary | ICD-10-CM

## 2019-08-27 DIAGNOSIS — E042 Nontoxic multinodular goiter: Secondary | ICD-10-CM

## 2019-08-27 DIAGNOSIS — M5030 Other cervical disc degeneration, unspecified cervical region: Secondary | ICD-10-CM

## 2019-08-27 DIAGNOSIS — M19042 Primary osteoarthritis, left hand: Secondary | ICD-10-CM

## 2019-08-27 DIAGNOSIS — Z8639 Personal history of other endocrine, nutritional and metabolic disease: Secondary | ICD-10-CM

## 2019-09-02 DIAGNOSIS — G4733 Obstructive sleep apnea (adult) (pediatric): Secondary | ICD-10-CM | POA: Diagnosis not present

## 2019-09-12 ENCOUNTER — Other Ambulatory Visit: Payer: Self-pay

## 2019-09-12 MED ORDER — SPIRONOLACTONE 25 MG PO TABS: 12.5000 mg | ORAL_TABLET | Freq: Every day | ORAL | 2 refills | Status: DC

## 2019-10-04 DIAGNOSIS — J309 Allergic rhinitis, unspecified: Secondary | ICD-10-CM | POA: Diagnosis not present

## 2019-10-04 DIAGNOSIS — H903 Sensorineural hearing loss, bilateral: Secondary | ICD-10-CM | POA: Diagnosis not present

## 2019-10-04 DIAGNOSIS — M26629 Arthralgia of temporomandibular joint, unspecified side: Secondary | ICD-10-CM | POA: Diagnosis not present

## 2019-10-04 DIAGNOSIS — H9313 Tinnitus, bilateral: Secondary | ICD-10-CM | POA: Diagnosis not present

## 2019-10-15 DIAGNOSIS — M810 Age-related osteoporosis without current pathological fracture: Secondary | ICD-10-CM | POA: Diagnosis not present

## 2019-10-15 DIAGNOSIS — E559 Vitamin D deficiency, unspecified: Secondary | ICD-10-CM | POA: Diagnosis not present

## 2019-10-15 DIAGNOSIS — E042 Nontoxic multinodular goiter: Secondary | ICD-10-CM | POA: Diagnosis not present

## 2019-10-18 DIAGNOSIS — I10 Essential (primary) hypertension: Secondary | ICD-10-CM | POA: Diagnosis not present

## 2019-10-18 DIAGNOSIS — E782 Mixed hyperlipidemia: Secondary | ICD-10-CM | POA: Diagnosis not present

## 2019-10-18 DIAGNOSIS — E119 Type 2 diabetes mellitus without complications: Secondary | ICD-10-CM | POA: Diagnosis not present

## 2019-10-18 DIAGNOSIS — E042 Nontoxic multinodular goiter: Secondary | ICD-10-CM | POA: Diagnosis not present

## 2019-10-18 LAB — MICROALBUMIN, URINE: Microalb, Ur: 10

## 2019-10-25 DIAGNOSIS — G4733 Obstructive sleep apnea (adult) (pediatric): Secondary | ICD-10-CM | POA: Diagnosis not present

## 2019-10-25 DIAGNOSIS — Z9989 Dependence on other enabling machines and devices: Secondary | ICD-10-CM | POA: Diagnosis not present

## 2019-11-15 DIAGNOSIS — E042 Nontoxic multinodular goiter: Secondary | ICD-10-CM | POA: Diagnosis not present

## 2019-11-28 DIAGNOSIS — D485 Neoplasm of uncertain behavior of skin: Secondary | ICD-10-CM | POA: Diagnosis not present

## 2019-11-28 DIAGNOSIS — L658 Other specified nonscarring hair loss: Secondary | ICD-10-CM | POA: Diagnosis not present

## 2019-11-28 DIAGNOSIS — L639 Alopecia areata, unspecified: Secondary | ICD-10-CM | POA: Diagnosis not present

## 2019-12-09 DIAGNOSIS — L639 Alopecia areata, unspecified: Secondary | ICD-10-CM | POA: Diagnosis not present

## 2019-12-13 DIAGNOSIS — Z1231 Encounter for screening mammogram for malignant neoplasm of breast: Secondary | ICD-10-CM | POA: Diagnosis not present

## 2019-12-13 LAB — HM MAMMOGRAPHY

## 2019-12-21 ENCOUNTER — Other Ambulatory Visit: Payer: Self-pay | Admitting: Cardiology

## 2020-01-20 NOTE — Progress Notes (Signed)
HPI: FUhypertension. Echocardiogram in October of 2012 showed normal LV function, grade 1 diastolic dysfunction, and mild left atrial enlargement. She states HCTZ caused "cramps". Since last seen, patient denies dyspnea, chest pain, palpitations or syncope.  Current Outpatient Medications  Medication Sig Dispense Refill  . Acetaminophen (TYLENOL EXTRA STRENGTH PO) Take 2 tablets by mouth as needed.    Marland Kitchen BYSTOLIC 20 MG TABS TAKE 1 TABLET BY MOUTH EVERY DAY 90 tablet 3  . fexofenadine (ALLEGRA) 180 MG tablet Take 1 tablet (180 mg total) by mouth daily. 90 tablet 1  . fluticasone (FLONASE) 50 MCG/ACT nasal spray Place into both nostrils as needed.     Marland Kitchen glucose blood test strip     . losartan (COZAAR) 100 MG tablet TAKE 1 TABLET BY MOUTH EVERY DAY 90 tablet 1  . meloxicam (MOBIC) 15 MG tablet Take 15 mg by mouth daily as needed.     . Multiple Vitamin (MULTIVITAMIN) tablet Take 1 tablet by mouth daily.    Marland Kitchen spironolactone (ALDACTONE) 25 MG tablet Take 0.5 tablets (12.5 mg total) by mouth daily. 45 tablet 2  . triamcinolone cream (KENALOG) 0.1 % Apply 1 application topically 2 (two) times daily. (Patient taking differently: Apply 1 application topically as needed.) 30 g 0  . verapamil (CALAN-SR) 240 MG CR tablet TAKE 1 TABLET BY MOUTH EVERYDAY AT BEDTIME (Patient taking differently: 90 mg.) 90 tablet 1   No current facility-administered medications for this visit.     Past Medical History:  Diagnosis Date  . Anemia   . Asthma 11/30/2015  . Bleeding hemorrhoids 06/05/2015  . DDD (degenerative disc disease), cervical 11/30/2015  . DDD (degenerative disc disease), lumbar 11/30/2015  . Diabetes mellitus   . Edema    LLE- podiatry in [place]  . Endometriosis   . Fibromyalgia 11/30/2015  . Goiter   . Hemorrhoids   . Hyperlipidemia   . Hypertension   . Iron malabsorption 06/05/2015  . Lumbosacral radiculopathy at S1 07/18/2014   Bilateral  . MGUS (monoclonal gammopathy of unknown  significance) 06/05/2015  . Osteoarthritis of both hands 11/30/2015  . Osteoarthritis of both knees 11/30/2015  . Osteopenia   . Osteoporosis 11/30/2015  . Other iron deficiency anemias 06/05/2015  . Pain, joint, hip, right    chronic- Dr Alvan Dame Dr Nelva Bush  . Prediabetes   . Pseudogout 11/30/2015  . Sleep apnea     Past Surgical History:  Procedure Laterality Date  . Back Injections    . ESOPHAGOGASTRODUODENOSCOPY  7-11   for anemia  . FLEXIBLE SIGMOIDOSCOPY  01/31/2011   Procedure: FLEXIBLE SIGMOIDOSCOPY;  Surgeon: Missy Sabins, MD;  Location: [place];  Service: Endoscopy;  Laterality: N/A;  . TOTAL ABDOMINAL HYSTERECTOMY  1995   bilat oophorectomy/ endometriosis  . VULVAR LESION REMOVAL  09/07/2011   Procedure: VULVAR LESION;  Surgeon: Alvino Chapel, MD;  Location: [facility];  Service: Gynecology;  Laterality: N/A;  vaginal lesion    Social History   Socioeconomic History  . Marital status: Married    Spouse name: Not on file  . Number of children: 2  . Years of education: Not on file  . Highest education level: Not on file  Occupational History  . 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 Smoking status: Never Smoker  . Smokeless tobacco: Never Used  Vaping Use  . Vaping Use: Never used  Substance and Sexual Activity  .  Alcohol use: No    Alcohol/week: 0.0 standard drinks    Comment: Doesn't drink   . Drug use: No  . Sexual activity: Yes    Birth control/protection: Surgical  Other Topics Concern  . Not on file  Social History Narrative   Lives in [place] with husband and daughter. Ambulatory, no cane or walker. Works at [employer].    [employer].   Right handed.   Caffeine none.   Social Determinants of Health   Financial Resource Strain: Not on file  Food Insecurity: Not on file  Transportation Needs: Not on file  Physical Activity: Not on file  Stress: Not on  file  Social Connections: Not on file  Intimate Partner Violence: Not on file    Family History  Problem Relation Age of Onset  . Rheum arthritis Mother   . Hypertension Mother   . Cancer Father        bladder  . Heart attack Father        MI at age 82  . Diabetes Brother   . Diabetes Brother   . Diabetes Brother   . Diabetes Brother   . Sarcoidosis Brother   . Sarcoidosis Sister   . Diabetes Sister   . Polycystic ovary syndrome Daughter   . Hypertension Daughter   . Anesthesia problems Neg Hx   . Hypotension Neg Hx   . Malignant hyperthermia Neg Hx   . Pseudochol deficiency Neg Hx     ROS: no fevers or chills, productive cough, hemoptysis, dysphasia, odynophagia, melena, hematochezia, dysuria, hematuria, rash, seizure activity, orthopnea, PND, pedal edema, claudication. Remaining systems are negative.  Physical Exam: Well-developed well-nourished in no acute distress.  Skin is warm and dry.  HEENT is normal.  Neck is supple.  Chest is clear to auscultation with normal expansion.  Cardiovascular exam is regular rate and rhythm.  Abdominal exam nontender or distended. No masses palpated. Extremities show no edema. neuro grossly intact  Electrocardiogram today personally reviewed and shows sinus rhythm at a rate of 61 and no ST changes.  A/P  1 hypertension-patient's blood pressure is controlled.  Continue present medications and follow.  Check potassium and renal function.  2 hyperlipidemia-followed by primary care.  Kirk Ruths, MD

## 2020-01-29 ENCOUNTER — Other Ambulatory Visit: Payer: Self-pay

## 2020-01-29 ENCOUNTER — Ambulatory Visit: Payer: BC Managed Care – PPO | Admitting: Cardiology

## 2020-01-29 ENCOUNTER — Encounter: Payer: Self-pay | Admitting: Cardiology

## 2020-01-29 VITALS — BP 130/74 | HR 61 | Ht 66.0 in | Wt 231.8 lb

## 2020-01-29 DIAGNOSIS — I10 Essential (primary) hypertension: Secondary | ICD-10-CM | POA: Diagnosis not present

## 2020-01-29 DIAGNOSIS — E7800 Pure hypercholesterolemia, unspecified: Secondary | ICD-10-CM | POA: Diagnosis not present

## 2020-01-29 NOTE — Patient Instructions (Signed)
Medication Instructions:  Your physician recommends that you continue on your current medications as directed. Please refer to the Current Medication list given to you today.  *If you need a refill on your cardiac medications before your next appointment, please call your pharmacy*  Lab Work: Your physician recommends that you return for lab work TODAY:   BMET If you have labs (blood work) drawn today and your tests are completely normal, you will receive your results only by: Marland Kitchen MyChart Message (if you have MyChart) OR . A paper copy in the mail If you have any lab test that is abnormal or we need to change your treatment, we will call you to review the results.  Testing/Procedures: NONE ordered at this time of appointment   Follow-Up: At [facility], you and your health needs are our priority.  As part of our continuing mission to provide you with exceptional heart care, we have created designated Provider Care Teams.  These Care Teams include your primary Cardiologist (physician) and Advanced Practice Providers (APPs -  Physician Assistants and Nurse Practitioners) who all work together to provide you with the care you need, when you need it.   Your next appointment:   1 year(s)  The format for your next appointment:   In Person  Provider:   Kirk Ruths, MD  Other Instructions

## 2020-01-30 LAB — BASIC METABOLIC PANEL
BUN/Creatinine Ratio: 16 (ref 12–28)
BUN: 21 mg/dL (ref 8–27)
CO2: 24 mmol/L (ref 20–29)
Calcium: 9.7 mg/dL (ref 8.7–10.3)
Chloride: 101 mmol/L (ref 96–106)
Creatinine, Ser: 1.28 mg/dL — ABNORMAL HIGH (ref 0.57–1.00)
GFR calc Af Amer: 52 mL/min/{1.73_m2} — ABNORMAL LOW (ref >59)
GFR calc non Af Amer: 45 mL/min/{1.73_m2} — ABNORMAL LOW (ref >59)
Glucose: 113 mg/dL — ABNORMAL HIGH (ref 65–99)
Potassium: 4.8 mmol/L (ref 3.5–5.2)
Sodium: 138 mmol/L (ref 134–144)

## 2020-02-05 DIAGNOSIS — H43391 Other vitreous opacities, right eye: Secondary | ICD-10-CM | POA: Diagnosis not present

## 2020-02-05 DIAGNOSIS — H40013 Open angle with borderline findings, low risk, bilateral: Secondary | ICD-10-CM | POA: Diagnosis not present

## 2020-02-05 DIAGNOSIS — H2513 Age-related nuclear cataract, bilateral: Secondary | ICD-10-CM | POA: Diagnosis not present

## 2020-02-05 DIAGNOSIS — H04123 Dry eye syndrome of bilateral lacrimal glands: Secondary | ICD-10-CM | POA: Diagnosis not present

## 2020-02-05 LAB — HM DIABETES EYE EXAM

## 2020-02-21 DIAGNOSIS — E119 Type 2 diabetes mellitus without complications: Secondary | ICD-10-CM | POA: Diagnosis not present

## 2020-02-21 DIAGNOSIS — E782 Mixed hyperlipidemia: Secondary | ICD-10-CM | POA: Diagnosis not present

## 2020-02-21 DIAGNOSIS — I10 Essential (primary) hypertension: Secondary | ICD-10-CM | POA: Diagnosis not present

## 2020-02-21 DIAGNOSIS — E042 Nontoxic multinodular goiter: Secondary | ICD-10-CM | POA: Diagnosis not present

## 2020-02-21 LAB — HEMOGLOBIN A1C: Hemoglobin A1C: 7.2

## 2020-03-13 DIAGNOSIS — M25511 Pain in right shoulder: Secondary | ICD-10-CM | POA: Diagnosis not present

## 2020-03-21 ENCOUNTER — Other Ambulatory Visit: Payer: Self-pay | Admitting: Cardiology

## 2020-03-21 ENCOUNTER — Other Ambulatory Visit: Payer: Self-pay | Admitting: Cardiovascular Disease

## 2020-03-23 MED ORDER — OLMESARTAN MEDOXOMIL 20 MG PO TABS: 20.0000 mg | ORAL_TABLET | Freq: Every day | ORAL | 3 refills | Status: DC

## 2020-03-27 ENCOUNTER — Telehealth: Payer: Self-pay

## 2020-03-27 NOTE — Telephone Encounter (Signed)
[name] with [employer] called and left a message stating Donna Dyer is past due for labs, lipid panel and cmp.   I did call patient and left a message advising her to call back and schedule an appointment.

## 2020-06-01 DIAGNOSIS — Z01419 Encounter for gynecological examination (general) (routine) without abnormal findings: Secondary | ICD-10-CM | POA: Diagnosis not present

## 2020-06-01 DIAGNOSIS — Z6837 Body mass index (BMI) 37.0-37.9, adult: Secondary | ICD-10-CM | POA: Diagnosis not present

## 2020-06-01 DIAGNOSIS — D649 Anemia, unspecified: Secondary | ICD-10-CM | POA: Diagnosis not present

## 2020-06-02 DIAGNOSIS — I10 Essential (primary) hypertension: Secondary | ICD-10-CM

## 2020-06-03 MED ORDER — OLMESARTAN MEDOXOMIL 40 MG PO TABS: 40.0000 mg | ORAL_TABLET | Freq: Every day | ORAL | 3 refills | Status: DC

## 2020-06-04 ENCOUNTER — Other Ambulatory Visit: Payer: Self-pay

## 2020-06-04 ENCOUNTER — Encounter: Payer: Self-pay | Admitting: Family Medicine

## 2020-06-04 ENCOUNTER — Ambulatory Visit: Payer: BC Managed Care – PPO | Admitting: Family Medicine

## 2020-06-04 VITALS — BP 136/72 | HR 67 | Ht 66.0 in | Wt 232.0 lb

## 2020-06-04 DIAGNOSIS — K909 Intestinal malabsorption, unspecified: Secondary | ICD-10-CM

## 2020-06-04 DIAGNOSIS — E119 Type 2 diabetes mellitus without complications: Secondary | ICD-10-CM

## 2020-06-04 DIAGNOSIS — E1169 Type 2 diabetes mellitus with other specified complication: Secondary | ICD-10-CM

## 2020-06-04 DIAGNOSIS — E785 Hyperlipidemia, unspecified: Secondary | ICD-10-CM

## 2020-06-04 DIAGNOSIS — E042 Nontoxic multinodular goiter: Secondary | ICD-10-CM

## 2020-06-04 DIAGNOSIS — I10 Essential (primary) hypertension: Secondary | ICD-10-CM

## 2020-06-04 DIAGNOSIS — E559 Vitamin D deficiency, unspecified: Secondary | ICD-10-CM

## 2020-06-04 DIAGNOSIS — D508 Other iron deficiency anemias: Secondary | ICD-10-CM

## 2020-06-04 DIAGNOSIS — K649 Unspecified hemorrhoids: Secondary | ICD-10-CM

## 2020-06-04 DIAGNOSIS — D472 Monoclonal gammopathy: Secondary | ICD-10-CM

## 2020-06-04 NOTE — Assessment & Plan Note (Addendum)
Uncontrolled.  Cards inc her  Olmesartan to 40mg . She will pick it up today. She has enough to double up for now. Check BMP in 1 week.  Labs ordered.

## 2020-06-04 NOTE — Assessment & Plan Note (Signed)
Will update labs. Not drawn since 2019.  Will refer to heme. She prefers [facility] for insurance reasions.

## 2020-06-04 NOTE — Progress Notes (Signed)
Established Patient Office Visit  Subjective:  Patient ID: Donna Dyer, female    DOB: 1960-10-08  Age: 60 y.o. MRN: [id number]  CC:  Chief Complaint  Patient presents with  . Follow-up    HPI TRINETTA ALEMU presents for   Recent labs with Dr. Gaetano Net showed fingerstick hgb 8.8.  They did do a CBC but I don't have a copy of that results.  She used to be followed by Dr. Marin Olp (HEm/Onc) back in 2019 for iron def and MGUS.  Hasn't been back since bc of insurance reasons.  Did have an iron infusion once and says it really didn't improve her numbers. Has been intoleranct to oral iron.    Hypertension- Pt denies chest pain, SOB, dizziness, or heart palpitations.  Taking meds as directed w/o problems.  Denies medication side effects.  BPS in the 140s at home. Feels like the olmesartan 20 hasn't been working as well as losartan 114m.  Reached out to Dr. CPennie Banterand he sent over [id number].       Past Medical History:  Diagnosis Date  . Anemia   . Asthma 11/30/2015  . Bleeding hemorrhoids 06/05/2015  . DDD (degenerative disc disease), cervical 11/30/2015  . DDD (degenerative disc disease), lumbar 11/30/2015  . Diabetes mellitus   . Edema    LLE- podiatry in [place]  . Endometriosis   . Fibromyalgia 11/30/2015  . Goiter   . Hemorrhoids   . Hyperlipidemia   . Hypertension   . Iron malabsorption 06/05/2015  . Lumbosacral radiculopathy at S1 07/18/2014   Bilateral  . MGUS (monoclonal gammopathy of unknown significance) 06/05/2015  . Osteoarthritis of both hands 11/30/2015  . Osteoarthritis of both knees 11/30/2015  . Osteopenia   . Osteoporosis 11/30/2015  . Other iron deficiency anemias 06/05/2015  . Pain, joint, hip, right    chronic- Dr OlAlvan Damer RaNelva Bush. Prediabetes   . Pseudogout 11/30/2015  . Sleep apnea     Past Surgical History:  Procedure Laterality Date  . Back Injections    . ESOPHAGOGASTRODUODENOSCOPY  7-11   for anemia  . FLEXIBLE SIGMOIDOSCOPY  01/31/2011    Procedure: FLEXIBLE SIGMOIDOSCOPY;  Surgeon: JoMissy SabinsMD;  [facility]: End[facility];  Laterality: N/A;  . TOTAL ABDOMINAL HYSTERECTOMY  1995   bilat oophorectomy/ endometriosis  . VULVAR LESION REMOVAL  09/07/2011   Procedure: VULVAR LESION;  Surgeon: DaAlvino ChapelMD;  [facility] Service: Gynecology;  Laterality: N/A;  vaginal lesion    Family History  Problem Relation Age of Onset  . Rheum arthritis Mother   . Hypertension Mother   . Cancer Father        bladder  . Heart attack Father        MI at age 62. Diabetes Brother   . Diabetes Brother   . Diabetes Brother   . Diabetes Brother   . Sarcoidosis Brother   . Sarcoidosis Sister   . Diabetes Sister   . Polycystic ovary syndrome Daughter   . Hypertension Daughter   . Anesthesia problems Neg Hx   . Hypotension Neg Hx   . Malignant hyperthermia Neg Hx   . Pseudochol deficiency Neg Hx     Social History   Socioeconomic History  . Marital status: Married    Spouse name: Not on file  . Number of children: 2  . Years of education: Not on file  . Highest education level:  Not on file  Occupational History  . 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 Smoking status: Never Smoker  . Smokeless tobacco: Never Used  Vaping Use  . Vaping Use: Never used  Substance and Sexual Activity  . Alcohol use: No    Alcohol/week: 0.0 standard drinks    Comment: Doesn't drink   . Drug use: No  . Sexual activity: Yes    Birth control/protection: Surgical  Other Topics Concern  . Not on file  Social History Narrative   Lives in [place] with husband and daughter. Ambulatory, no cane or walker. Works at [employer].    [employer].   Right handed.   Caffeine none.   Social Determinants of Health   Financial Resource Strain: Not on file  Food Insecurity: Not on file  Transportation Needs: Not on file   Physical Activity: Not on file  Stress: Not on file  Social Connections: Not on file  Intimate Partner Violence: Not on file    Outpatient Medications Prior to Visit  Medication Sig Dispense Refill  . Acetaminophen (TYLENOL EXTRA STRENGTH PO) Take 2 tablets by mouth as needed.    . fexofenadine (ALLEGRA) 180 MG tablet Take 1 tablet (180 mg total) by mouth daily. 90 tablet 1  . fluticasone (FLONASE) 50 MCG/ACT nasal spray Place into both nostrils as needed.     Marland Kitchen glucose blood test strip     . Multiple Vitamin (MULTIVITAMIN) tablet Take 1 tablet by mouth daily.    . Nebivolol HCl 20 MG TABS TAKE 1 TABLET BY MOUTH EVERY DAY 90 tablet 3  . olmesartan (BENICAR) 40 MG tablet Take 1 tablet (40 mg total) by mouth daily. 90 tablet 3  . spironolactone (ALDACTONE) 25 MG tablet Take 0.5 tablets (12.5 mg total) by mouth daily. 45 tablet 2  . triamcinolone cream (KENALOG) 0.1 % Apply 1 application topically 2 (two) times daily. (Patient taking differently: Apply 1 application topically as needed.) 30 g 0  . verapamil (CALAN-SR) 240 MG CR tablet TAKE 1 TABLET BY MOUTH EVERYDAY AT BEDTIME (Patient taking differently: 90 mg.) 90 tablet 1  . meloxicam (MOBIC) 15 MG tablet Take 15 mg by mouth daily as needed.      No facility-administered medications prior to visit.    Allergies  Allergen Reactions  . Sulfamethoxazole Rash  . Pineapple Itching and Swelling    Tongue swells Tongue swells  . Sulfasalazine Rash  . Avapro [Irbesartan] Other (See Comments)  . Onglyza [Saxagliptin] Itching  . Spironolactone Other (See Comments)    Dizzy and nauseated.  Dizzy and nauseated.  Dizzy and nauseated.  **currently taking w/ better toleration   . Valsartan Other (See Comments)    Leg cramps  Leg cramps **Currently taking w/ better toleration 07/12/17  . Amlodipine Besylate Other (See Comments)    muscle cramps  . Amlodipine Besylate Other (See Comments)    muscle cramps  . Asparagus Itching    Of the  mouth  . Azithromycin Itching and Rash  . Chlorthalidone Other (See Comments)    Muscle cramps Muscle cramps  . Codeine Nausea And Vomiting  . Dulaglutide Itching    ? Hypersensitivity reaction ? Hypersensitivity reaction  . Hydrochlorothiazide Other (See Comments)    Muscle spasms   Muscle spasms   . Metformin Diarrhea    Diarrhea when started on metformin IR $RemoveBefo'100mg'IdMAMZMfsuJ$  daily Diarrhea when started on metformin IR $RemoveBefo'100mg'KuQRHtdmBiw$  daily Diarrhea  when started on metformin IR $RemoveBefo'100mg'MYPPDPZYUzJ$  daily Diarrhea when started on metformin IR $RemoveBefo'100mg'HPplmsAasLY$  daily  . Metformin And Related Diarrhea  . Sulfa Antibiotics Rash    Other reaction(s): RASH Other reaction(s): RASH   . Sulfonamide Derivatives Rash    ROS Review of Systems    Objective:    Physical Exam Constitutional:      Appearance: She is well-developed.  HENT:     Head: Normocephalic and atraumatic.  Neck:     Comments: Left lobe of the thyroid gland is larger than right.  Cardiovascular:     Rate and Rhythm: Normal rate and regular rhythm.     Heart sounds: Normal heart sounds.  Pulmonary:     Effort: Pulmonary effort is normal.     Breath sounds: Normal breath sounds.  Lymphadenopathy:     Cervical: No cervical adenopathy.  Skin:    General: Skin is warm and dry.  Neurological:     Mental Status: She is alert and oriented to person, place, and time.  Psychiatric:        Behavior: Behavior normal.     BP 136/72   Pulse 67   Ht $R'5\' 6"'HQ$  (1.676 m)   Wt 232 lb (105.2 kg)   SpO2 100%   BMI 37.45 kg/m  Wt Readings from Last 3 Encounters:  06/04/20 232 lb (105.2 kg)  01/29/20 231 lb 12.8 oz (105.1 kg)  08/27/19 231 lb (104.8 kg)     Health Maintenance Due  Topic Date Due  . HIV Screening  Never done  . Hepatitis C Screening  Never done  . Zoster Vaccines- Shingrix (1 of 2) Never done  . COLONOSCOPY (Pts 45-37yrs Insurance coverage will need to be confirmed)  05/11/2018  . FOOT EXAM  10/27/2018  . OPHTHALMOLOGY EXAM  02/01/2019  .  HEMOGLOBIN A1C  03/08/2019  . COVID-19 Vaccine (4 - Booster for Pfizer series) 02/11/2020    There are no preventive care reminders to display for this patient.  Lab Results  Component Value Date   TSH 0.96 09/05/2018   TSH 0.96 09/05/2018   Lab Results  Component Value Date   WBC 7.2 10/20/2017   HGB 10.1 (L) 10/20/2017   HCT 32.1 (L) 10/20/2017   MCV 87.5 10/20/2017   PLT 273 10/20/2017   Lab Results  Component Value Date   NA 138 01/29/2020   K 4.8 01/29/2020   CHLORIDE 101 06/10/2016   CO2 24 01/29/2020   GLUCOSE 113 (H) 01/29/2020   BUN 21 01/29/2020   CREATININE 1.28 (H) 01/29/2020   BILITOT 0.7 10/20/2017   ALKPHOS 118 10/20/2017   AST 20 10/20/2017   ALT 19 10/20/2017   PROT 8.5 (H) 10/20/2017   ALBUMIN 4.1 09/05/2018   CALCIUM 9.7 01/29/2020   ANIONGAP 10 10/20/2017   EGFR 60 (L) 06/10/2016   GFR 73.96 10/22/2010   Lab Results  Component Value Date   CHOL 145 01/16/2015   Lab Results  Component Value Date   HDL 52 01/16/2015   Lab Results  Component Value Date   LDLCALC 55 01/16/2015   Lab Results  Component Value Date   TRIG 191 (A) 01/16/2015   Lab Results  Component Value Date   CHOLHDL 4.7 03/15/2013   Lab Results  Component Value Date   HGBA1C 6.3 09/05/2018      Assessment & Plan:   Problem List Items Addressed This Visit      Cardiovascular and Mediastinum   Essential hypertension, benign -  Primary    Uncontrolled.  Cards inc her  Olmesartan to 47m. She will pick it up today. She has enough to double up for now. Check BMP in 1 week.  Labs ordered.       Relevant Orders   CBC with Differential   Iron, TIBC and Ferritin Panel   Hemoglobin A1c   COMPLETE METABOLIC PANEL WITH GFR   Protein electrophoresis, serum   Lactate dehydrogenase   Kappa/lambda light chains   IgG, IgA, IgM   Lipid Panel w/reflex Direct LDL   VITAMIN D 25 Hydroxy (Vit-D Deficiency, Fractures)   Bleeding hemorrhoids (Chronic)    refer to Gen Surg.  Could be contribute to her anemia. She reports she can bleed a lot at times,       Relevant Orders   Ambulatory referral to General Surgery     Digestive   Iron malabsorption (Chronic)    Refer back to heme.  Will update blood levels.         Endocrine   Multinodular goiter   Hyperlipidemia associated with type 2 diabetes mellitus ([place]   Relevant Orders   COMPLETE METABOLIC PANEL WITH GFR   Lipid Panel w/reflex Direct LDL   Diabetes mellitus type 2, controlled, without complications (HCC)   Relevant Orders   CBC with Differential   Iron, TIBC and Ferritin Panel   Hemoglobin A1c   COMPLETE METABOLIC PANEL WITH GFR   Protein electrophoresis, serum   Lactate dehydrogenase   Kappa/lambda light chains   IgG, IgA, IgM   Lipid Panel w/reflex Direct LDL   VITAMIN D 25 Hydroxy (Vit-D Deficiency, Fractures)     Other   Vitamin D deficiency    Recheck levels.        Relevant Orders   VITAMIN D 25 Hydroxy (Vit-D Deficiency, Fractures)   Other iron deficiency anemias (Chronic)   Relevant Orders   CBC with Differential   Iron, TIBC and Ferritin Panel   Hemoglobin A1c   COMPLETE METABOLIC PANEL WITH GFR   Protein electrophoresis, serum   Lactate dehydrogenase   Kappa/lambda light chains   IgG, IgA, IgM   Lipid Panel w/reflex Direct LDL   VITAMIN D 25 Hydroxy (Vit-D Deficiency, Fractures)   Ambulatory referral to Hematology / Oncology   MGUS (monoclonal gammopathy of unknown significance) (Chronic)    Will update labs. Not drawn since 2019.  Will refer to heme. She prefers [facility] for insurance reasions.       Relevant Orders   CBC with Differential   Iron, TIBC and Ferritin Panel   Hemoglobin A1c   COMPLETE METABOLIC PANEL WITH GFR   Protein electrophoresis, serum   Lactate dehydrogenase   Kappa/lambda light chains   IgG, IgA, IgM   Lipid Panel w/reflex Direct LDL   VITAMIN D 25 Hydroxy (Vit-D Deficiency, Fractures)   Ambulatory referral to Hematology / Oncology       No orders of the defined types were placed in this encounter.   Follow-up: Return in about 3 months (around 09/04/2020) for Diabetes follow-up.   I spent 45 minutes on the day of the encounter to include pre-visit record review, face-to-face time with the patient and post visit ordering of test.   CBeatrice Lecher MD

## 2020-06-04 NOTE — Assessment & Plan Note (Signed)
Refer back to heme.  Will update blood levels.

## 2020-06-04 NOTE — Assessment & Plan Note (Signed)
refer to Gen Surg. Could be contribute to her anemia. She reports she can bleed a lot at times,

## 2020-06-04 NOTE — Assessment & Plan Note (Signed)
Recheck levels 

## 2020-06-05 DIAGNOSIS — E559 Vitamin D deficiency, unspecified: Secondary | ICD-10-CM | POA: Diagnosis not present

## 2020-06-05 DIAGNOSIS — D472 Monoclonal gammopathy: Secondary | ICD-10-CM | POA: Diagnosis not present

## 2020-06-05 DIAGNOSIS — E785 Hyperlipidemia, unspecified: Secondary | ICD-10-CM | POA: Diagnosis not present

## 2020-06-05 DIAGNOSIS — E119 Type 2 diabetes mellitus without complications: Secondary | ICD-10-CM | POA: Diagnosis not present

## 2020-06-05 DIAGNOSIS — E1169 Type 2 diabetes mellitus with other specified complication: Secondary | ICD-10-CM | POA: Diagnosis not present

## 2020-06-05 DIAGNOSIS — I10 Essential (primary) hypertension: Secondary | ICD-10-CM | POA: Diagnosis not present

## 2020-06-05 DIAGNOSIS — D508 Other iron deficiency anemias: Secondary | ICD-10-CM | POA: Diagnosis not present

## 2020-06-09 ENCOUNTER — Other Ambulatory Visit: Payer: Self-pay | Admitting: *Deleted

## 2020-06-09 ENCOUNTER — Encounter: Payer: Self-pay | Admitting: Family Medicine

## 2020-06-09 NOTE — Progress Notes (Signed)
Donna Dyer. Changed referral :  General 06/05/2020 7:38 AM Lyn Henri, Jenny Reichmann A - -  Note   Patient wants to go to [facility] I am changing referral to send to them.   Sent referral with ov notes and insurance to [facility] at [phone] and [phone]. They will call and schedule with patient for good appointment time - CF

## 2020-06-09 NOTE — Telephone Encounter (Signed)
Okay to redo referral for hematology for [facility].

## 2020-06-10 ENCOUNTER — Telehealth: Payer: Self-pay | Admitting: *Deleted

## 2020-06-10 NOTE — Telephone Encounter (Signed)
Patient called to cancel her appointment with Dr. Marin Olp on 06/30/20. She said that she told Dr. Morrie Sheldon office that she wanted [facility] because of the change in the billing process at [facility] with now getting two bills and having to pay a co-pay. Appointment has been canceled.

## 2020-06-15 DIAGNOSIS — L639 Alopecia areata, unspecified: Secondary | ICD-10-CM | POA: Diagnosis not present

## 2020-06-16 DIAGNOSIS — K649 Unspecified hemorrhoids: Secondary | ICD-10-CM | POA: Diagnosis not present

## 2020-06-16 MED ORDER — VITAMIN D (ERGOCALCIFEROL) 1.25 MG (50000 UNIT) PO CAPS: 50000.0000 [IU] | ORAL_CAPSULE | ORAL | 3 refills | Status: DC

## 2020-06-16 NOTE — Telephone Encounter (Signed)
Note sent in regards to lab results.  Meds ordered this encounter  Medications  . Vitamin D, Ergocalciferol, (DRISDOL) 1.25 MG (50000 UNIT) CAPS capsule    Sig: Take 1 capsule (50,000 Units total) by mouth every 7 (seven) days.    Dispense:  12 capsule    Refill:  3

## 2020-06-19 ENCOUNTER — Other Ambulatory Visit: Payer: Self-pay | Admitting: Cardiovascular Disease

## 2020-06-30 ENCOUNTER — Ambulatory Visit: Payer: BC Managed Care – PPO | Admitting: Hematology & Oncology

## 2020-06-30 ENCOUNTER — Other Ambulatory Visit: Payer: BC Managed Care – PPO

## 2020-07-07 MED ORDER — VERAPAMIL HCL ER 180 MG PO TBCR: 90.0000 mg | EXTENDED_RELEASE_TABLET | Freq: Every day | ORAL | 3 refills | Status: DC

## 2020-08-07 ENCOUNTER — Emergency Department
Admission: EM | Admit: 2020-08-07 | Discharge: 2020-08-07 | Disposition: A | Discharge status: Home / Self Care | Payer: BC Managed Care – PPO | Source: Home / Self Care

## 2020-08-07 ENCOUNTER — Other Ambulatory Visit: Payer: Self-pay

## 2020-08-07 DIAGNOSIS — W57XXXA Bitten or stung by nonvenomous insect and other nonvenomous arthropods, initial encounter: Secondary | ICD-10-CM | POA: Diagnosis not present

## 2020-08-07 DIAGNOSIS — R21 Rash and other nonspecific skin eruption: Secondary | ICD-10-CM | POA: Diagnosis not present

## 2020-08-07 DIAGNOSIS — S90861A Insect bite (nonvenomous), right foot, initial encounter: Secondary | ICD-10-CM | POA: Diagnosis not present

## 2020-08-07 MED ORDER — CEPHALEXIN 500 MG PO CAPS: 500.0000 mg | ORAL_CAPSULE | Freq: Two times a day (BID) | ORAL | 0 refills | Status: AC

## 2020-08-07 MED ORDER — METHYLPREDNISOLONE 4 MG PO TBPK: ORAL_TABLET | ORAL | 0 refills | Status: DC

## 2020-08-07 NOTE — ED Triage Notes (Signed)
Pt presents to Urgent Care with c/o small bumps/rash to bil forearms and lower legs, noticed today. Reports she was bitten by fire ants on 07/25/20 on her R foot. Blister-like lesion noted to top of R foot below 2nd toe.

## 2020-08-07 NOTE — Discharge Instructions (Addendum)
Advised patient to take medications as directed with food to completion.  Encouraged patient to increase daily water intake while taking these medications.

## 2020-08-07 NOTE — ED Provider Notes (Signed)
[facility]    CSN: [id number] Arrival date & time: 08/07/20  1415      History   Chief Complaint Chief Complaint  Patient presents with  . Insect Bite  . Rash    Donna Dyer is a 60 y.o. female.   Donna 60 year old female presents with rash of bilateral forearms and lower legs that started today.  Patient reports bitten by fire ants on 07/25/2020 and now has noticed blisterlike lesion on top of right foot adjacent to fire ant bite.  PMH significant for skin sensation disturbance.  Past Medical History:  Diagnosis Date  . Anemia   . Asthma 11/30/2015  . Bleeding hemorrhoids 06/05/2015  . DDD (degenerative disc disease), cervical 11/30/2015  . DDD (degenerative disc disease), lumbar 11/30/2015  . Diabetes mellitus   . Edema    LLE- podiatry in [place]  . Endometriosis   . Fibromyalgia 11/30/2015  . Goiter   . Hemorrhoids   . Hyperlipidemia   . Hypertension   . Iron malabsorption 06/05/2015  . Lumbosacral radiculopathy at S1 07/18/2014   Bilateral  . MGUS (monoclonal gammopathy of unknown significance) 06/05/2015  . Osteoarthritis of both hands 11/30/2015  . Osteoarthritis of both knees 11/30/2015  . Osteopenia   . Osteoporosis 11/30/2015  . Other iron deficiency anemias 06/05/2015  . Pain, joint, hip, right    chronic- Dr Alvan Dame Dr Nelva Bush  . Prediabetes   . Pseudogout 11/30/2015  . Sleep apnea     Patient Active Problem List   Diagnosis Date Noted  . Metatarsalgia of both feet 05/01/2017  . Other acquired hammer toe 05/01/2017  . Toe pain, bilateral 05/01/2017  . Prediabetes 11/15/2016  . Nontoxic single thyroid nodule 03/13/2016  . DDD (degenerative disc disease), cervical 11/30/2015  . DDD (degenerative disc disease), lumbar 11/30/2015  . Osteoarthritis of both hands 11/30/2015  . Osteoarthritis of both knees 11/30/2015  . Osteoporosis 11/30/2015  . Anemia 11/30/2015  . Asthma 11/30/2015  . Bilateral primary osteoarthritis of knee 11/26/2015   . Vitamin D deficiency 10/13/2015  . Food intolerance 07/03/2015  . MGUS (monoclonal gammopathy of unknown significance) 06/05/2015  . Other iron deficiency anemias 06/05/2015  . Iron malabsorption 06/05/2015  . Bleeding hemorrhoids 06/05/2015  . Other seasonal allergic rhinitis 11/29/2013  . Venous stasis 11/29/2013  . Cramping of feet 09/10/2013  . Hyperlipidemia associated with type 2 diabetes mellitus ([place]) 07/19/2012  . Hypersomnia with sleep apnea 07/19/2012  . Severe obesity (BMI 35.0-39.9) with comorbidity ([place]) 07/19/2012  . Skin sensation disturbance 07/19/2012  . Thoracic or lumbosacral neuritis or radiculitis 06/16/2012  . Right L4-L5 Lumbosacral facet joint syndrome 02/16/2012  . Hypertriglyceridemia 09/26/2011  . Vaginal intraepithelial neoplasia III (VAIN III) 08/23/2011  . Borderline glaucoma, open angle with borderline findings 03/30/2011  . OSA (obstructive sleep apnea) 03/29/2011  . Multinodular goiter 02/28/2011  . Internal hemorrhoids 01/31/2011  . Globus sensation 10/06/2010  . Diabetes mellitus type 2, controlled, without complications ([place]) 123456  . Essential hypertension, benign 02/23/2010  . Edema 09/23/2009    Past Surgical History:  Procedure Laterality Date  . Back Injections    . ESOPHAGOGASTRODUODENOSCOPY  7-11   for anemia  . FLEXIBLE SIGMOIDOSCOPY  01/31/2011   Procedure: FLEXIBLE SIGMOIDOSCOPY;  Surgeon: Missy Sabins, MD;  Location: [place];  Service: Endoscopy;  Laterality: N/A;  . TOTAL ABDOMINAL HYSTERECTOMY  1995   bilat oophorectomy/ endometriosis  . VULVAR LESION REMOVAL  09/07/2011   Procedure: VULVAR LESION;  Surgeon: Quillian Quince  Doylene Bode, MD;  Location: [place];  Service: Gynecology;  Laterality: N/A;  vaginal lesion    OB History   No obstetric history on file.      Home Medications    Prior to Admission medications   Medication Sig Start Date End Date Taking? Authorizing Provider  cephALEXin  (KEFLEX) 500 MG capsule Take 1 capsule (500 mg total) by mouth 2 (two) times daily for 7 days. 08/07/20 08/14/20 Yes Eliezer Lofts, FNP  clobetasol (TEMOVATE) 0.05 % external solution Apply 1 application topically 2 (two) times daily.   Yes [provider]  fluocinonide (LIDEX) 0.05 % external solution Apply 1 application topically 2 (two) times daily. To insect bites   Yes [provider]  methylPREDNISolone (MEDROL DOSEPAK) 4 MG TBPK tablet Take as directed. 08/07/20  Yes Eliezer Lofts, FNP  Acetaminophen (TYLENOL EXTRA STRENGTH PO) Take 2 tablets by mouth as needed.    [provider]  fexofenadine (ALLEGRA) 180 MG tablet Take 1 tablet (180 mg total) by mouth daily. 09/11/12   Hali Marry, MD  fluticasone (FLONASE) 50 MCG/ACT nasal spray Place into both nostrils as needed.     [provider]  glucose blood test strip  12/29/15   [provider]  losartan (COZAAR) 100 MG tablet TAKE 1 TABLET BY MOUTH EVERY DAY 06/19/20   Croitoru, Mihai, MD  Multiple Vitamin (MULTIVITAMIN) tablet Take 1 tablet by mouth daily.    [provider]  Nebivolol HCl 20 MG TABS TAKE 1 TABLET BY MOUTH EVERY DAY 03/23/20   Lelon Perla, MD  olmesartan (BENICAR) 40 MG tablet Take 1 tablet (40 mg total) by mouth daily. 06/03/20   Lelon Perla, MD  spironolactone (ALDACTONE) 25 MG tablet Take 0.5 tablets (12.5 mg total) by mouth daily. 09/12/19   Lelon Perla, MD  triamcinolone cream (KENALOG) 0.1 % Apply 1 application topically 2 (two) times daily. Patient taking differently: Apply 1 application topically as needed. 10/01/17   Noe Gens, PA-C  verapamil (CALAN-SR) 180 MG CR tablet Take 0.5 tablets (90 mg total) by mouth daily. 07/07/20   Lelon Perla, MD  Vitamin D, Ergocalciferol, (DRISDOL) 1.25 MG (50000 UNIT) CAPS capsule Take 1 capsule (50,000 Units total) by mouth every 7 (seven) days. 06/16/20   Hali Marry, MD    Family  History Family History  Problem Relation Age of Onset  . Rheum arthritis Mother   . Hypertension Mother   . Cancer Father        bladder  . Heart attack Father        MI at age 53  . Diabetes Brother   . Diabetes Brother   . Diabetes Brother   . Diabetes Brother   . Sarcoidosis Brother   . Sarcoidosis Sister   . Diabetes Sister   . Polycystic ovary syndrome Daughter   . Hypertension Daughter   . Anesthesia problems Neg Hx   . Hypotension Neg Hx   . Malignant hyperthermia Neg Hx   . Pseudochol deficiency Neg Hx     Social History Social History   Tobacco Use  . Smoking status: Never  . Smokeless tobacco: Never  Vaping Use  . Vaping Use: Never used  Substance Use Topics  . Alcohol use: No    Alcohol/week: 0.0 standard drinks    Comment: Doesn't drink   . Drug use: No     Allergies   Sulfamethoxazole, Pineapple, Sulfasalazine, Avapro [irbesartan], Onglyza [saxagliptin],  Spironolactone, Valsartan, Amlodipine besylate, Amlodipine besylate, Asparagus, Azithromycin, Chlorthalidone, Codeine, Dulaglutide, Hydrochlorothiazide, Metformin, Metformin and related, Sulfa antibiotics, and Sulfonamide derivatives   Review of Systems Review of Systems  Skin:  Positive for rash.  All other systems reviewed and are negative.   Physical Exam Triage Vital Signs ED Triage Vitals  Enc Vitals Group     BP 08/07/20 1502 (!) 144/81     Pulse Rate 08/07/20 1502 71     Resp 08/07/20 1502 20     Temp 08/07/20 1502 97.9 F (36.6 C)     Temp Source 08/07/20 1502 Oral     SpO2 08/07/20 1502 100 %     Weight 08/07/20 1457 225 lb (102.1 kg)     Height 08/07/20 1457 '5\' 6"'$  (1.676 m)     Head Circumference --      Peak Flow --      Pain Score 08/07/20 1457 0     Pain Loc --      Pain Edu? --      Excl. in [place]? --    No data found.  Updated Vital Signs BP (!) 144/81 (BP Location: Right Arm)   Pulse 71   Temp 97.9 F (36.6 C) (Oral)   Resp 20   Ht '5\' 6"'$  (1.676 m)   Wt 225 lb  (102.1 kg)   SpO2 100%   BMI 36.32 kg/m   Physical Exam Constitutional:      General: She is not in acute distress.    Appearance: Normal appearance. She is obese. She is not ill-appearing.  HENT:     Head: Normocephalic and atraumatic.     Mouth/Throat:     Mouth: Mucous membranes are moist.     Pharynx: Oropharynx is clear.  Eyes:     Extraocular Movements: Extraocular movements intact.     Conjunctiva/sclera: Conjunctivae normal.     Pupils: Pupils are equal, round, and reactive to light.  Cardiovascular:     Rate and Rhythm: Normal rate and regular rhythm.     Pulses: Normal pulses.     Heart sounds: Normal heart sounds. No murmur heard. Pulmonary:     Effort: Pulmonary effort is normal.     Breath sounds: Normal breath sounds. No wheezing, rhonchi or rales.     Comments: No adventitious breath sounds noted Musculoskeletal:        General: Normal range of motion.     Cervical back: Normal range of motion and neck supple. No tenderness.  Lymphadenopathy:     Cervical: No cervical adenopathy.  Skin:    General: Skin is warm and dry.     Comments: Bilateral lower arms (anterior aspects): Tiny erythematous maculopapular eruption, pruritic in nature.   Right foot (dorsum): ~0.5 x 0.5 cm oval-shaped clear roof bullae noted, mild erythematous base.  Neurological:     General: No focal deficit present.     Mental Status: She is alert and oriented to person, place, and time. Mental status is at baseline.  Psychiatric:        Mood and Affect: Mood normal.        Behavior: Behavior normal.     UC Treatments / Results  Labs (all labs ordered are listed, but only abnormal results are displayed) Labs Reviewed - No data to display  EKG   Radiology No results found.  Procedures Procedures (including critical care time)  Medications Ordered in UC Medications - No data to display  Initial Impression / Assessment and Plan /  UC Course  I have reviewed the triage vital  signs and the nursing notes.  Pertinent labs & imaging results that were available during my care of the patient were reviewed by me and considered in my medical decision making (see chart for details).   MDM: 1.  Insect bite of right foot, initial encounter-Rx'd Keflex, 2.  Rash and nonspecific skin eruption-Rx'd- Medrol Dose Pack.  Advised patient to take medication as directed with food to completion.  Patient discharged home, hemodynamically stable. Final Clinical Impressions(s) / UC Diagnoses   Final diagnoses:  Insect bite of right foot, initial encounter  Rash and nonspecific skin eruption     Discharge Instructions      Advised patient to take medications as directed with food to completion.  Encouraged patient to increase daily water intake while taking these medications.     ED Prescriptions     Medication Sig Dispense Auth. Provider   methylPREDNISolone (MEDROL DOSEPAK) 4 MG TBPK tablet Take as directed. 1 each Eliezer Lofts, FNP   cephALEXin (KEFLEX) 500 MG capsule Take 1 capsule (500 mg total) by mouth 2 (two) times daily for 7 days. 14 capsule Eliezer Lofts, FNP      PDMP not reviewed this encounter.   Eliezer Lofts, [place] 08/07/20 1605

## 2020-08-10 ENCOUNTER — Encounter: Payer: Self-pay | Admitting: Family Medicine

## 2020-08-10 ENCOUNTER — Other Ambulatory Visit: Payer: Self-pay

## 2020-08-10 ENCOUNTER — Telehealth: Payer: Self-pay | Admitting: Family Medicine

## 2020-08-10 ENCOUNTER — Ambulatory Visit: Payer: BC Managed Care – PPO | Admitting: Family Medicine

## 2020-08-10 VITALS — BP 152/77 | HR 63 | Temp 98.3°F | Ht 66.0 in | Wt 233.0 lb

## 2020-08-10 DIAGNOSIS — E119 Type 2 diabetes mellitus without complications: Secondary | ICD-10-CM | POA: Diagnosis not present

## 2020-08-10 DIAGNOSIS — H938X3 Other specified disorders of ear, bilateral: Secondary | ICD-10-CM

## 2020-08-10 DIAGNOSIS — I10 Essential (primary) hypertension: Secondary | ICD-10-CM | POA: Diagnosis not present

## 2020-08-10 NOTE — Assessment & Plan Note (Addendum)
Follows with [facility] endocrinology we will try to call and see if we can get her last A1c

## 2020-08-10 NOTE — Progress Notes (Signed)
Acute Office Visit  Subjective:    Patient ID: Donna Dyer, female    DOB: 1960-02-12, 60 y.o.   MRN: [id number]  Chief Complaint  Patient presents with   Rash    On arms and legs, onset 07/25/2020     ears clogged    HPI Patient is in today for rash on arms and legs. onset 07/25/2020.  Started after she was bit by red ants and ended up getting some blisters on her feet and then a rash on her forearms she has a couple of days left on her prednisone and Keflex she was seen at urgent care.  She is feeling better overall.  The blister on the top of her left foot is getting a little smaller  Ears also feel clogged.  That started sometime last week as well.  She says it just feels like they are clogged they are not hurting or draining.  She was taking her Allegra she also has been using Flonase and now she is on prednisone for the rash she feels like it is maybe a little bit better.  She says they are not popping  Hypertension- Pt denies chest pain, SOB, dizziness, or heart palpitations.  Taking meds as directed w/o problems.  Denies medication side effects.  He had noted her blood pressures been running a little higher over the last week as well.   Past Medical History:  Diagnosis Date   Anemia    Asthma 11/30/2015   Bleeding hemorrhoids 06/05/2015   DDD (degenerative disc disease), cervical 11/30/2015   DDD (degenerative disc disease), lumbar 11/30/2015   Diabetes mellitus    Edema    LLE- podiatry in [place]   Endometriosis    Fibromyalgia 11/30/2015   Goiter    Hemorrhoids    Hyperlipidemia    Hypertension    Iron malabsorption 06/05/2015   Lumbosacral radiculopathy at S1 07/18/2014   Bilateral   MGUS (monoclonal gammopathy of unknown significance) 06/05/2015   Osteoarthritis of both hands 11/30/2015   Osteoarthritis of both knees 11/30/2015   Osteopenia    Osteoporosis 11/30/2015   Other iron deficiency anemias 06/05/2015   Pain, joint, hip, right    chronic- Dr Alvan Dame Dr  Nelva Bush   Prediabetes    Pseudogout 11/30/2015   Sleep apnea     Past Surgical History:  Procedure Laterality Date   Back Injections     ESOPHAGOGASTRODUODENOSCOPY  7-11   for anemia   FLEXIBLE SIGMOIDOSCOPY  01/31/2011   Procedure: FLEXIBLE SIGMOIDOSCOPY;  Surgeon: Missy Sabins, MD;  Location: [place];  Service: Endoscopy;  Laterality: N/A;   TOTAL ABDOMINAL HYSTERECTOMY  1995   bilat oophorectomy/ endometriosis   VULVAR LESION REMOVAL  09/07/2011   Procedure: VULVAR LESION;  Surgeon: Alvino Chapel, MD;  Location[facility]  Service: Gynecology;  Laterality: N/A;  vaginal lesion    Family History  Problem Relation Age of Onset   Rheum arthritis Mother    Hypertension Mother    Cancer Father        bladder   Heart attack Father        MI at age 43   Diabetes Brother    Diabetes Brother    Diabetes Brother    Diabetes Brother    Sarcoidosis Brother    Sarcoidosis Sister    Diabetes Sister    Polycystic ovary syndrome Daughter    Hypertension Daughter    Anesthesia problems Neg Hx    Hypotension  Neg Hx    Malignant hyperthermia Neg Hx    Pseudochol deficiency Neg Hx     Social History   Socioeconomic History   Marital status: Married    Spouse name: Not on file   Number of children: 2   Years of education: Not on file   Highest education level: Not on file  Occupational History   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 Ambulatory, no cane or walker. Works at [employer].    [employer].   Right handed.   Caffeine  none.   Social Determinants of Health   Financial Resource Strain: Not on file  Food Insecurity: Not on file  Transportation Needs: Not on file  Physical Activity: Not on file  Stress: Not on file  Social Connections: Not on file  Intimate Partner Violence: Not on file    Outpatient Medications Prior to Visit  Medication Sig Dispense Refill   Acetaminophen (TYLENOL EXTRA STRENGTH PO) Take 2 tablets by mouth as needed.     cephALEXin (KEFLEX) 500 MG capsule Take 1 capsule (500 mg total) by mouth 2 (two) times daily for 7 days. 14 capsule 0   clobetasol (TEMOVATE) 0.05 % external solution Apply 1 application topically 2 (two) times daily.     fexofenadine (ALLEGRA) 180 MG tablet Take 1 tablet (180 mg total) by mouth daily. 90 tablet 1   fluocinonide (LIDEX) 0.05 % external solution Apply 1 application topically 2 (two) times daily. To insect bites     fluticasone (FLONASE) 50 MCG/ACT nasal spray Place into both nostrils as needed.      glucose blood test strip      losartan (COZAAR) 100 MG tablet TAKE 1 TABLET BY MOUTH EVERY DAY 90 tablet 1   methylPREDNISolone (MEDROL DOSEPAK) 4 MG TBPK tablet Take as directed. 1 each 0   Multiple Vitamin (MULTIVITAMIN) tablet Take 1 tablet by mouth daily.     Nebivolol HCl 20 MG TABS TAKE 1 TABLET BY MOUTH EVERY DAY 90 tablet 3   spironolactone (ALDACTONE) 25 MG tablet Take 0.5 tablets (12.5 mg total) by mouth daily. 45 tablet 2   triamcinolone cream (KENALOG) 0.1 % Apply 1 application topically 2 (two) times daily. (Patient taking differently: Apply 1 application topically as needed.) 30 g 0   verapamil (CALAN-SR) 180 MG CR tablet Take 0.5 tablets (90 mg total) by mouth daily. 45 tablet 3   Vitamin D, Ergocalciferol, (DRISDOL) 1.25 MG (50000 UNIT) CAPS capsule Take 1 capsule (50,000 Units total) by mouth every 7 (seven) days. 12 capsule 3   olmesartan (BENICAR) 40 MG tablet Take 1 tablet (40 mg total) by mouth daily. 90 tablet 3   No  facility-administered medications prior to visit.    Allergies  Allergen Reactions   Sulfamethoxazole Rash   Pineapple Itching and Swelling    Tongue swells Tongue swells   Sulfasalazine Rash   Avapro [Irbesartan] Other (See Comments)   Onglyza [Saxagliptin] Itching   Spironolactone Other (See Comments)  Dizzy and nauseated.  Dizzy and nauseated.  Dizzy and nauseated.  **currently taking w/ better toleration    Valsartan Other (See Comments)    Leg cramps  Leg cramps **Currently taking w/ better toleration 07/12/17   Amlodipine Besylate Other (See Comments)    muscle cramps   Amlodipine Besylate Other (See Comments)    muscle cramps   Asparagus Itching    Of the mouth   Azithromycin Itching and Rash   Chlorthalidone Other (See Comments)    Muscle cramps Muscle cramps   Codeine Nausea And Vomiting   Dulaglutide Itching    ? Hypersensitivity reaction ? Hypersensitivity reaction   Hydrochlorothiazide Other (See Comments)    Muscle spasms   Muscle spasms    Metformin Diarrhea    Diarrhea when started on metformin IR 120m daily Diarrhea when started on metformin IR 1084mdaily Diarrhea when started on metformin IR 10077maily Diarrhea when started on metformin IR 100m67mily   Metformin And Related Diarrhea   Sulfa Antibiotics Rash    Other reaction(s): RASH Other reaction(s): RASH    Sulfonamide Derivatives Rash    Review of Systems     Objective:    Physical Exam  BP (!) 152/77   Pulse 63   Temp 98.3 F (36.8 C) (Oral)   Ht _0  (1.676 m)   Wt 233 lb (105.7 kg)   BMI 37.61 kg/m  Wt Readings from Last 3 Encounters:  08/10/20 233 lb (105.7 kg)  08/07/20 225 lb (102.1 kg)  06/04/20 232 lb (105.2 kg)    Health Maintenance Due  Topic Date Due   HIV Screening  Never done   Hepatitis C Screening  Never done   Zoster Vaccines- Shingrix (1 of 2) Never done   Pneumococcal Vaccine [id number] old (3 - PCV) 03/09/2012   COLONOSCOPY (Pts 45-67yr93yrsurance coverage will need to be confirmed)  05/11/2018   FOOT EXAM  10/27/2018   HEMOGLOBIN A1C  03/08/2019   COVID-19 Vaccine (4 - Booster for Pfizer series) 02/11/2020   INFLUENZA VACCINE  08/10/2020    There are no preventive care reminders to display for this patient.   Lab Results  Component Value Date   TSH 0.96 09/05/2018   TSH 0.96 09/05/2018   Lab Results  Component Value Date   WBC 7.2 10/20/2017   HGB 10.1 (L) 10/20/2017   HCT 32.1 (L) 10/20/2017   MCV 87.5 10/20/2017   PLT 273 10/20/2017   Lab Results  Component Value Date   NA 138 01/29/2020   K 4.8 01/29/2020   CHLORIDE 101 06/10/2016   CO2 24 01/29/2020   GLUCOSE 113 (H) 01/29/2020   BUN 21 01/29/2020   CREATININE 1.28 (H) 01/29/2020   BILITOT 0.7 10/20/2017   ALKPHOS 118 10/20/2017   AST 20 10/20/2017   ALT 19 10/20/2017   PROT 8.5 (H) 10/20/2017   ALBUMIN 4.1 09/05/2018   CALCIUM 9.7 01/29/2020   ANIONGAP 10 10/20/2017   EGFR 60 (L) 06/10/2016   GFR 73.96 10/22/2010   Lab Results  Component Value Date   CHOL 145 01/16/2015   Lab Results  Component Value Date   HDL 52 01/16/2015   Lab Results  Component Value Date   LDLCALC 55 01/16/2015   Lab Results  Component Value Date   TRIG 191 (A) 01/16/2015   Lab Results  Component Value Date   CHOLHDL 4.7 03/15/2013   Lab Results  Component Value Date   HGBA1C 6.3 09/05/2018  Assessment & Plan:   Problem List Items Addressed This Visit       Cardiovascular and Mediastinum   Essential hypertension, benign    Pressure is elevated today was little bit better on recheck and even she reports its been a little bit more elevated over the last week or 2 since she started having symptoms.  Recommend increase her carvedilol to a whole tab she is currently taking a half a tab at least from the next week or 2 if blood pressures coming back down then okay to go back to half of a tab dose.         Endocrine   Diabetes mellitus type  2, controlled, without complications ([place])    Follows with [place] endocrinology we will try to call and see if we can get her last A1c       Other Visit Diagnoses     Clogged ear, bilateral    -  Primary      Rash -much improved.  Make sure to complete antibiotics and prednisone.  Bilateral ear clogged-exam is completely normal and reassuring suspect that she has some element of eustachian tube dysfunction she started tried an oral antihistamine as well as a nasal steroid spray and is currently on prednisone for another issue she does feel like her ears are little bit better today than they were last week so I think it is reasonable to complete the prednisone and see if by the end of the week she continues to feel better if not then recommend that she follow-up with her ENT for further evaluation.  No orders of the defined types were placed in this encounter.    Beatrice Lecher, MD

## 2020-08-10 NOTE — Telephone Encounter (Signed)
Please call [facility] endocrinology for her most recent A1c and also see if they have given her a pneumonia vaccine so that we can update her chart.

## 2020-08-10 NOTE — Assessment & Plan Note (Signed)
Pressure is elevated today was little bit better on recheck and even she reports its been a little bit more elevated over the last week or 2 since she started having symptoms.  Recommend increase her carvedilol to a whole tab she is currently taking a half a tab at least from the next week or 2 if blood pressures coming back down then okay to go back to half of a tab dose.

## 2020-08-11 NOTE — Progress Notes (Deleted)
Office Visit Note  Patient: Donna Dyer             Date of Birth: 1960/06/21           MRN: [id number]             PCP: Hali Marry, MD Referring: Hali Marry, * Visit Date: 08/25/2020 Occupation: '@GUAROCC'$ @  Subjective:  No chief complaint on file.   History of Present Illness: Donna Dyer is a 60 y.o. female ***   Activities of Daily Living:  Patient reports morning stiffness for *** {minute/hour:19697}.   Patient {ACTIONS;DENIES/REPORTS:21021675::"Denies"} nocturnal pain.  Difficulty dressing/grooming: {ACTIONS;DENIES/REPORTS:21021675::"Denies"} Difficulty climbing stairs: {ACTIONS;DENIES/REPORTS:21021675::"Denies"} Difficulty getting out of chair: {ACTIONS;DENIES/REPORTS:21021675::"Denies"} Difficulty using hands for taps, buttons, cutlery, and/or writing: {ACTIONS;DENIES/REPORTS:21021675::"Denies"}  No Rheumatology ROS completed.   PMFS History:  Patient Active Problem List   Diagnosis Date Noted   Metatarsalgia of both feet 05/01/2017   Other acquired hammer toe 05/01/2017   Toe pain, bilateral 05/01/2017   Prediabetes 11/15/2016   Nontoxic single thyroid nodule 03/13/2016   DDD (degenerative disc disease), cervical 11/30/2015   DDD (degenerative disc disease), lumbar 11/30/2015   Osteoarthritis of both hands 11/30/2015   Osteoarthritis of both knees 11/30/2015   Osteoporosis 11/30/2015   Anemia 11/30/2015   Asthma 11/30/2015   Bilateral primary osteoarthritis of knee 11/26/2015   Vitamin D deficiency 10/13/2015   Food intolerance 07/03/2015   MGUS (monoclonal gammopathy of unknown significance) 06/05/2015   Other iron deficiency anemias 06/05/2015   Iron malabsorption 06/05/2015   Bleeding hemorrhoids 06/05/2015   Other seasonal allergic rhinitis 11/29/2013   Venous stasis 11/29/2013   Cramping of feet 09/10/2013   Hyperlipidemia associated with type 2 diabetes mellitus ([place]) 07/19/2012   Hypersomnia with sleep apnea  07/19/2012   Severe obesity (BMI 35.0-39.9) with comorbidity ([place]) 07/19/2012   Skin sensation disturbance 07/19/2012   Thoracic or lumbosacral neuritis or radiculitis 06/16/2012   Right L4-L5 Lumbosacral facet joint syndrome 02/16/2012   Hypertriglyceridemia 09/26/2011   Vaginal intraepithelial neoplasia III (VAIN III) 08/23/2011   Borderline glaucoma, open angle with borderline findings 03/30/2011   OSA (obstructive sleep apnea) 03/29/2011   Multinodular goiter 02/28/2011   Internal hemorrhoids 01/31/2011   Globus sensation 10/06/2010   Diabetes mellitus type 2, controlled, without complications ([place]) 123456   Essential hypertension, benign 02/23/2010   Edema 09/23/2009    Past Medical History:  Diagnosis Date   Anemia    Asthma 11/30/2015   Bleeding hemorrhoids 06/05/2015   DDD (degenerative disc disease), cervical 11/30/2015   DDD (degenerative disc disease), lumbar 11/30/2015   Diabetes mellitus    Edema    LLE- podiatry in [place]   Endometriosis    Fibromyalgia 11/30/2015   Goiter    Hemorrhoids    Hyperlipidemia    Hypertension    Iron malabsorption 06/05/2015   Lumbosacral radiculopathy at S1 07/18/2014   Bilateral   MGUS (monoclonal gammopathy of unknown significance) 06/05/2015   Osteoarthritis of both hands 11/30/2015   Osteoarthritis of both knees 11/30/2015   Osteopenia    Osteoporosis 11/30/2015   Other iron deficiency anemias 06/05/2015   Pain, joint, hip, right    chronic- Dr Alvan Dame Dr Nelva Bush   Prediabetes    Pseudogout 11/30/2015   Sleep apnea     Family History  Problem Relation Age of Onset   Rheum arthritis Mother    Hypertension Mother    Cancer Father        bladder   Heart attack  Father        MI at age 73   Diabetes Brother    Diabetes Brother    Diabetes Brother    Diabetes Brother    Sarcoidosis Brother    Sarcoidosis Sister    Diabetes Sister    Polycystic ovary syndrome Daughter    Hypertension Daughter    Anesthesia problems Neg Hx     Hypotension Neg Hx    Malignant hyperthermia Neg Hx    Pseudochol deficiency Neg Hx    Past Surgical History:  Procedure Laterality Date   Back Injections     ESOPHAGOGASTRODUODENOSCOPY  7-11   for anemia   FLEXIBLE SIGMOIDOSCOPY  01/31/2011   Procedure: FLEXIBLE SIGMOIDOSCOPY;  Surgeon: Missy Sabins, MD;  Location: [facility];  Service: Endoscopy;  Laterality: N/A;   TOTAL ABDOMINAL HYSTERECTOMY  1995   bilat oophorectomy/ endometriosis   VULVAR LESION REMOVAL  09/07/2011   Procedure: VULVAR LESION;  Surgeon: Alvino Chapel, MD;  Location: [facility];  Service: Gynecology;  Laterality: N/A;  vaginal lesion   Social History   Social History Narrative   Lives in [place] with husband and daughter. Ambulatory, no cane or walker. Works at [employer].    [employer].   Right handed.   Caffeine none.   Immunization History  Administered Date(s) Administered   Influenza,inj,Quad PF,6+ Mos 11/06/2012, 10/20/2014   Influenza-Unspecified 10/23/2012, 10/22/2013, 10/11/2015, 10/13/2016, 10/18/2017, 10/25/2019   PFIZER(Purple Top)SARS-COV-2 Vaccination 04/03/2019, 04/24/2019, 11/11/2019   PPD Test 03/15/2012   Pneumococcal Polysaccharide-23 09/27/2010, 03/10/2011   Tdap 01/16/2014   Tetanus 08/20/2019     Objective: Vital Signs: There were no vitals taken for this visit.   Physical Exam   Musculoskeletal Exam: ***  CDAI Exam: CDAI Score: -- Patient Global: --; Provider Global: -- Swollen: --; Tender: -- Joint Exam 08/25/2020   No joint exam has been documented for this visit   There is currently no information documented on the homunculus. Go to the Rheumatology activity and complete the homunculus joint exam.  Investigation: No additional findings.  Imaging: No results found.  Recent Labs: Lab Results  Component Value Date   WBC 7.2 10/20/2017   HGB 10.1 (L) 10/20/2017   PLT 273 10/20/2017   NA 138 01/29/2020   K 4.8  01/29/2020   CL 101 01/29/2020   CO2 24 01/29/2020   GLUCOSE 113 (H) 01/29/2020   BUN 21 01/29/2020   CREATININE 1.28 (H) 01/29/2020   BILITOT 0.7 10/20/2017   ALKPHOS 118 10/20/2017   AST 20 10/20/2017   ALT 19 10/20/2017   PROT 8.5 (H) 10/20/2017   ALBUMIN 4.1 09/05/2018   CALCIUM 9.7 01/29/2020   GFRAA 52 (L) 01/29/2020    Speciality Comments: PLQ Eye Exam: 01/31/18 WNL @ [facility] Follow up in 1 year.  Procedures:  No procedures performed Allergies: Sulfamethoxazole, Pineapple, Sulfasalazine, Avapro [irbesartan], Onglyza [saxagliptin], Spironolactone, Valsartan, Amlodipine besylate, Amlodipine besylate, Asparagus, Azithromycin, Chlorthalidone, Codeine, Dulaglutide, Hydrochlorothiazide, Metformin, Metformin and related, Sulfa antibiotics, and Sulfonamide derivatives   Assessment / Plan:     Visit Diagnoses: No diagnosis found.  Orders: No orders of the defined types were placed in this encounter.  No orders of the defined types were placed in this encounter.   Face-to-face time spent with patient was *** minutes. Greater than 50% of time was spent in counseling and coordination of care.  Follow-Up Instructions: No follow-ups on file.   Earnestine Mealing, CMA  Note - This  record has been created using [employer].  Chart creation errors have been sought, but may not always  have been located. Such creation errors do not reflect on  the standard of medical care.

## 2020-08-13 NOTE — Telephone Encounter (Signed)
I was unable to contact their office. The number on their web site is a fax number. I sent a fax requests for medical records of HgbA1C and vaccine record.

## 2020-08-19 NOTE — Telephone Encounter (Signed)
Faxed documentation of pt's most recent A1c received from [facility] and placed in Dr. Gardiner Ramus basket.  Charyl Bigger, CMA

## 2020-08-19 NOTE — Telephone Encounter (Signed)
Spoke with Donna Dyer at [facility] who states that pt's last A1c was done on 02/21/2020 and was 7.2.  She also states that they do not do immunizations in their office so pt has not received a pneumonia vaccine at their office.  She will fax over documentation of most recent A1c. Charyl Bigger, CMA

## 2020-08-20 ENCOUNTER — Other Ambulatory Visit: Payer: Self-pay

## 2020-08-21 ENCOUNTER — Other Ambulatory Visit: Payer: Self-pay | Admitting: Cardiology

## 2020-08-25 ENCOUNTER — Ambulatory Visit: Payer: BC Managed Care – PPO | Admitting: Rheumatology

## 2020-08-25 DIAGNOSIS — M19041 Primary osteoarthritis, right hand: Secondary | ICD-10-CM

## 2020-08-25 DIAGNOSIS — Z8639 Personal history of other endocrine, nutritional and metabolic disease: Secondary | ICD-10-CM

## 2020-08-25 DIAGNOSIS — Z862 Personal history of diseases of the blood and blood-forming organs and certain disorders involving the immune mechanism: Secondary | ICD-10-CM

## 2020-08-25 DIAGNOSIS — D472 Monoclonal gammopathy: Secondary | ICD-10-CM

## 2020-08-25 DIAGNOSIS — M5030 Other cervical disc degeneration, unspecified cervical region: Secondary | ICD-10-CM

## 2020-08-25 DIAGNOSIS — Z8679 Personal history of other diseases of the circulatory system: Secondary | ICD-10-CM

## 2020-08-25 DIAGNOSIS — M19071 Primary osteoarthritis, right ankle and foot: Secondary | ICD-10-CM

## 2020-08-25 DIAGNOSIS — M5136 Other intervertebral disc degeneration, lumbar region: Secondary | ICD-10-CM

## 2020-08-25 DIAGNOSIS — D072 Carcinoma in situ of vagina: Secondary | ICD-10-CM

## 2020-08-25 DIAGNOSIS — Z79899 Other long term (current) drug therapy: Secondary | ICD-10-CM

## 2020-08-25 DIAGNOSIS — M170 Bilateral primary osteoarthritis of knee: Secondary | ICD-10-CM

## 2020-08-25 DIAGNOSIS — E042 Nontoxic multinodular goiter: Secondary | ICD-10-CM

## 2020-08-25 DIAGNOSIS — M1990 Unspecified osteoarthritis, unspecified site: Secondary | ICD-10-CM

## 2020-08-25 DIAGNOSIS — Z8669 Personal history of other diseases of the nervous system and sense organs: Secondary | ICD-10-CM

## 2020-08-27 DIAGNOSIS — L2089 Other atopic dermatitis: Secondary | ICD-10-CM | POA: Diagnosis not present

## 2020-09-04 ENCOUNTER — Ambulatory Visit: Payer: BC Managed Care – PPO | Admitting: Family Medicine

## 2020-09-11 DIAGNOSIS — D649 Anemia, unspecified: Secondary | ICD-10-CM | POA: Diagnosis not present

## 2020-09-11 DIAGNOSIS — D472 Monoclonal gammopathy: Secondary | ICD-10-CM | POA: Diagnosis not present

## 2020-09-15 DIAGNOSIS — D472 Monoclonal gammopathy: Secondary | ICD-10-CM | POA: Diagnosis not present

## 2020-09-15 DIAGNOSIS — C9000 Multiple myeloma not having achieved remission: Secondary | ICD-10-CM | POA: Diagnosis not present

## 2020-09-24 ENCOUNTER — Encounter: Payer: Self-pay | Admitting: Family Medicine

## 2020-09-24 ENCOUNTER — Telehealth: Payer: Self-pay | Admitting: Family Medicine

## 2020-09-24 ENCOUNTER — Other Ambulatory Visit: Payer: Self-pay

## 2020-09-24 ENCOUNTER — Ambulatory Visit: Payer: BC Managed Care – PPO | Admitting: Family Medicine

## 2020-09-24 VITALS — BP 134/68 | HR 71 | Ht 66.0 in | Wt 230.0 lb

## 2020-09-24 DIAGNOSIS — E119 Type 2 diabetes mellitus without complications: Secondary | ICD-10-CM | POA: Diagnosis not present

## 2020-09-24 DIAGNOSIS — K909 Intestinal malabsorption, unspecified: Secondary | ICD-10-CM

## 2020-09-24 DIAGNOSIS — I10 Essential (primary) hypertension: Secondary | ICD-10-CM | POA: Diagnosis not present

## 2020-09-24 DIAGNOSIS — Z23 Encounter for immunization: Secondary | ICD-10-CM | POA: Diagnosis not present

## 2020-09-24 DIAGNOSIS — D472 Monoclonal gammopathy: Secondary | ICD-10-CM

## 2020-09-24 MED ORDER — OZEMPIC (0.25 OR 0.5 MG/DOSE) 2 MG/1.5ML ~~LOC~~ SOPN: PEN_INJECTOR | SUBCUTANEOUS | 1 refills | Status: DC

## 2020-09-24 NOTE — Telephone Encounter (Signed)
Please see MyChart note sent to patient.   '@TODAY'$ @

## 2020-09-24 NOTE — Progress Notes (Signed)
Established Patient Office Visit  Subjective:  Patient ID: Donna Dyer, female    DOB: 06/09/1960  Age: 60 y.o. MRN: [id number]  CC:  Chief Complaint  Patient presents with   Hypertension    HPI [name] presents for   Hypertension- Pt denies chest pain, SOB, dizziness, or heart palpitations.  Taking meds as directed w/o problems.  Denies medication side effects.  We increased her carvedilol to a whole tab.  Diabetes-she is followed by endocrinology but does not have an appointment until later next month.  She was on prednisone recently for rash and it did bump her sugars around that time.  But her sugars have stayed persistently elevated.  She is getting upper 100s in the mornings with her blood fasting blood sugars.  Just cannot seem to get it to go back down.  Says her last A1c was around 7.0.  She also had a rash when I last saw her she ended up seeing dermatology because it never went away completely.  He gave her a topical steroid cream and put her on Zyrtec instead of Allegra and she has had much better control of her symptoms overall.  Hx of MGUS, she recently established with a new oncologist at [facility].  They recently did a complete bone survey and did see some scattered lucent areas in the calvarium.  Unfortunately she will not get a chance to go over those results with the provider personally until October so that is been somewhat stressful and worrisome.  Her iron levels are still low.  Though her protein levels are similar to what they were when she was being followed with Dr. Marin Olp.  Past Medical History:  Diagnosis Date   Anemia    Asthma 11/30/2015   Bleeding hemorrhoids 06/05/2015   DDD (degenerative disc disease), cervical 11/30/2015   DDD (degenerative disc disease), lumbar 11/30/2015   Diabetes mellitus    Edema    LLE- podiatry in [place]   Endometriosis    Fibromyalgia 11/30/2015   Goiter    Hemorrhoids    Hyperlipidemia    Hypertension     Iron malabsorption 06/05/2015   Lumbosacral radiculopathy at S1 07/18/2014   Bilateral   MGUS (monoclonal gammopathy of unknown significance) 06/05/2015   Osteoarthritis of both hands 11/30/2015   Osteoarthritis of both knees 11/30/2015   Osteopenia    Osteoporosis 11/30/2015   Other iron deficiency anemias 06/05/2015   Pain, joint, hip, right    chronic- Dr Alvan Dame Dr Nelva Bush   Prediabetes    Pseudogout 11/30/2015   Sleep apnea     Past Surgical History:  Procedure Laterality Date   Back Injections     ESOPHAGOGASTRODUODENOSCOPY  7-11   for anemia   FLEXIBLE SIGMOIDOSCOPY  01/31/2011   Procedure: FLEXIBLE SIGMOIDOSCOPY;  Surgeon: Missy Sabins, MD;  Location: [facility];  Service: Endoscopy;  Laterality: N/A;   TOTAL ABDOMINAL HYSTERECTOMY  1995   bilat oophorectomy/ endometriosis   VULVAR LESION REMOVAL  09/07/2011   Procedure: VULVAR LESION;  Surgeon: Alvino Chapel, MD;  Location: [facility];  Service: Gynecology;  Laterality: N/A;  vaginal lesion    Family History  Problem Relation Age of Onset   Rheum arthritis Mother    Hypertension Mother    Cancer Father        bladder   Heart attack Father        MI at age 27   Diabetes Brother    Diabetes Brother  Diabetes Brother    Diabetes Brother    Sarcoidosis Brother    Sarcoidosis Sister    Diabetes Sister    Polycystic ovary syndrome Daughter    Hypertension Daughter    Anesthesia problems Neg Hx    Hypotension Neg Hx    Malignant hyperthermia Neg Hx    Pseudochol deficiency Neg Hx     Social History   Socioeconomic History   Marital status: Married    Spouse name: Not on file   Number of children: 2   Years of education: Not on file   Highest education level: Not on file  Occupational History   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 Ambulatory, no cane or walker. Works at [employer].    [employer].   Right handed.   Caffeine none.   Social Determinants of Health   Financial Resource Strain: Not on file  Food Insecurity: Not on file  Transportation Needs: Not on file  Physical Activity: Not on file  Stress: Not on file  Social Connections: Not on file  Intimate Partner Violence: Not on file    Outpatient Medications Prior to Visit  Medication Sig Dispense Refill   Acetaminophen (TYLENOL EXTRA STRENGTH PO) Take 2 tablets by mouth as needed.     clobetasol (TEMOVATE) 0.05 % external solution Apply 1 application topically 2 (two) times daily.     fexofenadine (ALLEGRA) 180 MG tablet Take 1 tablet (180 mg total) by mouth daily. 90 tablet 1   fluocinonide (LIDEX) 0.05 % external solution Apply 1 application topically 2 (two) times daily. To insect bites     fluticasone (FLONASE) 50 MCG/ACT nasal spray Place into both nostrils as needed.      glucose blood test strip      losartan (COZAAR) 100 MG tablet TAKE 1 TABLET BY MOUTH EVERY DAY 90 tablet 1   Multiple Vitamin (MULTIVITAMIN) tablet Take 1 tablet by mouth daily.     Nebivolol HCl 20 MG TABS TAKE 1 TABLET BY MOUTH EVERY DAY 90 tablet 3   spironolactone (ALDACTONE) 25 MG tablet TAKE 1/2 TABLET BY MOUTH EVERY DAY 45 tablet 2   triamcinolone cream (KENALOG) 0.1 % Apply 1 application topically 2 (two) times daily. (Patient taking differently: Apply 1 application topically as needed.) 30 g 0   verapamil (CALAN-SR) 180 MG CR tablet Take 0.5 tablets (90 mg total) by mouth daily. 45 tablet 3   Vitamin D, Ergocalciferol, (DRISDOL) 1.25 MG (50000 UNIT) CAPS capsule Take 1 capsule (50,000 Units total) by  mouth every 7 (seven) days. 12 capsule 3   methylPREDNISolone (MEDROL DOSEPAK) 4 MG TBPK tablet Take as directed. 1 each 0   No facility-administered medications prior to visit.    Allergies  Allergen Reactions   Sulfamethoxazole Rash   Pineapple Itching and Swelling    Tongue swells Tongue swells   Sulfasalazine Rash   Avapro [Irbesartan] Other (See Comments)   Onglyza [Saxagliptin] Itching   Spironolactone Other (See Comments)  Dizzy and nauseated.  Dizzy and nauseated.  Dizzy and nauseated.  **currently taking w/ better toleration    Valsartan Other (See Comments)    Leg cramps  Leg cramps **Currently taking w/ better toleration 07/12/17   Amlodipine Besylate Other (See Comments)    muscle cramps   Amlodipine Besylate Other (See Comments)    muscle cramps   Asparagus Itching    Of the mouth   Azithromycin Itching and Rash   Chlorthalidone Other (See Comments)    Muscle cramps Muscle cramps   Codeine Nausea And Vomiting   Dulaglutide Itching    ? Hypersensitivity reaction ? Hypersensitivity reaction   Hydrochlorothiazide Other (See Comments)    Muscle spasms   Muscle spasms    Metformin Diarrhea    Diarrhea when started on metformin IR $RemoveBefo'100mg'YBMRrFdgnbm$  daily Diarrhea when started on metformin IR $RemoveBefo'100mg'OaxmngPTVmI$  daily Diarrhea when started on metformin IR $RemoveBefo'100mg'GncdkLGGLyC$  daily Diarrhea when started on metformin IR $RemoveBefo'100mg'zUbbHCiUkCW$  daily   Metformin And Related Diarrhea   Sulfa Antibiotics Rash    Other reaction(s): RASH Other reaction(s): RASH    Sulfonamide Derivatives Rash    ROS Review of Systems    Objective:    Physical Exam Constitutional:      Appearance: Normal appearance. She is well-developed.  HENT:     Head: Normocephalic and atraumatic.  Cardiovascular:     Rate and Rhythm: Normal rate and regular rhythm.     Heart sounds: Normal heart sounds.  Pulmonary:     Effort: Pulmonary effort is normal.     Breath sounds: Normal breath sounds.  Skin:    General: Skin is warm and  dry.  Neurological:     Mental Status: She is alert and oriented to person, place, and time.  Psychiatric:        Behavior: Behavior normal.    BP 134/68   Pulse 71   Ht $R'5\' 6"'Xp$  (1.676 m)   Wt 230 lb (104.3 kg)   SpO2 100%   BMI 37.12 kg/m  Wt Readings from Last 3 Encounters:  09/24/20 230 lb (104.3 kg)  08/10/20 233 lb (105.7 kg)  08/07/20 225 lb (102.1 kg)     Health Maintenance Due  Topic Date Due   COLONOSCOPY (Pts 45-101yrs Insurance coverage will need to be confirmed)  05/11/2018   FOOT EXAM  10/27/2018   HEMOGLOBIN A1C  08/20/2020    There are no preventive care reminders to display for this patient.  Lab Results  Component Value Date   TSH 0.96 09/05/2018   TSH 0.96 09/05/2018   Lab Results  Component Value Date   WBC 7.2 10/20/2017   HGB 10.1 (L) 10/20/2017   HCT 32.1 (L) 10/20/2017   MCV 87.5 10/20/2017   PLT 273 10/20/2017   Lab Results  Component Value Date   NA 138 01/29/2020   K 4.8 01/29/2020   CHLORIDE 101 06/10/2016   CO2 24 01/29/2020   GLUCOSE 113 (H) 01/29/2020   BUN 21 01/29/2020   CREATININE 1.28 (H) 01/29/2020   BILITOT 0.7 10/20/2017   ALKPHOS 118 10/20/2017   AST 20 10/20/2017   ALT 19 10/20/2017   PROT 8.5 (H) 10/20/2017   ALBUMIN 4.1 09/05/2018   CALCIUM 9.7 01/29/2020   ANIONGAP 10 10/20/2017   EGFR 60 (L) 06/10/2016   GFR 73.96 10/22/2010   Lab Results  Component Value Date   CHOL 145 01/16/2015   Lab Results  Component Value Date   HDL 52 01/16/2015   Lab  Results  Component Value Date   LDLCALC 55 01/16/2015   Lab Results  Component Value Date   TRIG 191 (A) 01/16/2015   Lab Results  Component Value Date   CHOLHDL 4.7 03/15/2013   Lab Results  Component Value Date   HGBA1C 7.2 02/21/2020      Assessment & Plan:   Problem List Items Addressed This Visit       Cardiovascular and Mediastinum   Essential hypertension, benign - Primary    Well controlled. Continue current regimen. Follow up in  6 mo          Digestive   Iron malabsorption (Chronic)    Hematology is considering iron infusions again.  She said she will know more when she follows up in a couple of weeks.        Endocrine   Diabetes mellitus type 2, controlled, without complications [facility])    Discussed options she does have a follow-up with her endocrinologist next month.  So we discussed maybe a trial of Ozempic.  If she starts to get itching just let me know, and can stop the medication.  I am concerned that her next A1c may be elevated if we do not get her on something to help control the sugars.  Continue work on [employer] and regular exercise.      Relevant Medications   Semaglutide,0.25 or 0.5MG/DOS, (OZEMPIC, 0.25 OR 0.5 MG/DOSE,) 2 MG/1.5ML SOPN     Other   MGUS (monoclonal gammopathy of unknown significance) (Chronic)    She was noted to have some lytic lesions in the skull on recent bone scan but in looking back from her prior bone scan back in 2014 they had noted some lesions there previously so hopefully it is just the same lesions and nothing new.  Her blood work does look reassuring that everything is stable but again she will follow-up with him otology oncology in about 2 weeks and have a more definitive answer.      Other Visit Diagnoses     Need for Zostavax administration       Relevant Orders   Varicella-zoster vaccine IM (Shingrix) (Completed)       Meds ordered this encounter  Medications   Semaglutide,0.25 or 0.5MG/DOS, (OZEMPIC, 0.25 OR 0.5 MG/DOSE,) 2 MG/1.5ML SOPN    Sig: Ok to increase to 0.5 weekly after 1 month    Dispense:  3 mL    Refill:  1     Follow-up: Return in about 6 months (around 03/24/2021).    Beatrice Lecher, MD

## 2020-09-25 NOTE — Assessment & Plan Note (Signed)
Well controlled. Continue current regimen. Follow up in  6 mo  

## 2020-09-25 NOTE — Assessment & Plan Note (Signed)
Discussed options she does have a follow-up with her endocrinologist next month.  So we discussed maybe a trial of Ozempic.  If she starts to get itching just let me know, and can stop the medication.  I am concerned that her next A1c may be elevated if we do not get her on something to help control the sugars.  Continue work on [employer] and regular exercise.

## 2020-09-25 NOTE — Assessment & Plan Note (Signed)
She was noted to have some lytic lesions in the skull on recent bone scan but in looking back from her prior bone scan back in 2014 they had noted some lesions there previously so hopefully it is just the same lesions and nothing new.  Her blood work does look reassuring that everything is stable but again she will follow-up with him otology oncology in about 2 weeks and have a more definitive answer.

## 2020-09-25 NOTE — Assessment & Plan Note (Signed)
Hematology is considering iron infusions again.  She said she will know more when she follows up in a couple of weeks.

## 2020-09-28 ENCOUNTER — Other Ambulatory Visit: Payer: Self-pay | Admitting: *Deleted

## 2020-09-28 MED ORDER — VITAMIN D (ERGOCALCIFEROL) 1.25 MG (50000 UNIT) PO CAPS: 50000.0000 [IU] | ORAL_CAPSULE | ORAL | 3 refills | Status: DC

## 2020-09-29 ENCOUNTER — Other Ambulatory Visit: Payer: Self-pay | Admitting: *Deleted

## 2020-09-29 MED ORDER — VERAPAMIL HCL ER 180 MG PO TBCR: 90.0000 mg | EXTENDED_RELEASE_TABLET | Freq: Every day | ORAL | 1 refills | Status: DC

## 2020-09-29 MED ORDER — LOSARTAN POTASSIUM 100 MG PO TABS: 100.0000 mg | ORAL_TABLET | Freq: Every day | ORAL | 1 refills | Status: DC

## 2020-09-29 MED ORDER — NEBIVOLOL HCL 20 MG PO TABS: 1.0000 | ORAL_TABLET | Freq: Every day | ORAL | 1 refills | Status: DC

## 2020-09-29 MED ORDER — SPIRONOLACTONE 25 MG PO TABS
12.5000 mg | ORAL_TABLET | Freq: Every day | ORAL | 1 refills | Status: DC
Start: 2020-09-29 — End: 2021-03-12

## 2020-10-06 ENCOUNTER — Telehealth: Payer: Self-pay | Admitting: Family Medicine

## 2020-10-06 NOTE — Telephone Encounter (Signed)
Hi Donna Dyer,  I just wanted to reach out you starting a statin to reducde your risk of heart disease and stroke bc of your diagnosis of diabetes. Not sure if endocin ever mentioned this to you before.  I would lhighly recommend it.

## 2020-10-07 NOTE — Telephone Encounter (Signed)
LVM for pt to return call to discuss.  Charyl Bigger, CMA

## 2020-10-07 NOTE — Telephone Encounter (Signed)
Spoke w/pt she stated that she has an upcoming appointment with Endo and will discuss this with them at her visit. I asked that she either send [place] a my chart or call and let us know. She stated that she would.

## 2020-10-09 DIAGNOSIS — E559 Vitamin D deficiency, unspecified: Secondary | ICD-10-CM | POA: Diagnosis not present

## 2020-10-09 DIAGNOSIS — E042 Nontoxic multinodular goiter: Secondary | ICD-10-CM | POA: Diagnosis not present

## 2020-10-12 DIAGNOSIS — D472 Monoclonal gammopathy: Secondary | ICD-10-CM | POA: Diagnosis not present

## 2020-10-12 DIAGNOSIS — E611 Iron deficiency: Secondary | ICD-10-CM | POA: Diagnosis not present

## 2020-10-13 DIAGNOSIS — E042 Nontoxic multinodular goiter: Secondary | ICD-10-CM | POA: Diagnosis not present

## 2020-10-13 DIAGNOSIS — E782 Mixed hyperlipidemia: Secondary | ICD-10-CM | POA: Diagnosis not present

## 2020-10-13 DIAGNOSIS — I10 Essential (primary) hypertension: Secondary | ICD-10-CM | POA: Diagnosis not present

## 2020-10-13 DIAGNOSIS — E119 Type 2 diabetes mellitus without complications: Secondary | ICD-10-CM | POA: Diagnosis not present

## 2020-10-18 ENCOUNTER — Encounter: Payer: Self-pay | Admitting: Family Medicine

## 2020-10-23 DIAGNOSIS — E611 Iron deficiency: Secondary | ICD-10-CM | POA: Diagnosis not present

## 2020-10-30 DIAGNOSIS — E611 Iron deficiency: Secondary | ICD-10-CM | POA: Diagnosis not present

## 2020-11-02 ENCOUNTER — Telehealth: Payer: Self-pay | Admitting: Family Medicine

## 2020-11-02 NOTE — Telephone Encounter (Signed)
Please call patient and let her know that she really would benefit significantly from being on a statin especially with her history of diabetes.  If she is okay with starting something then please let me know.

## 2020-11-03 NOTE — Telephone Encounter (Signed)
LVM for pt to call to discuss.  T. Epic Tribbett, CMA  

## 2020-11-04 NOTE — Telephone Encounter (Signed)
LVM for pt to call to discuss.  T. Lashawnda Hancox, CMA  

## 2020-11-04 NOTE — Telephone Encounter (Signed)
Okay, perfect!  That sounds good.  I hope your biopsy goes well.  I know those can be painful.  I am glad to see your A1c is under 7 which is awesome.  Hopefully the iron infusion goes well to and helps you feel little better.  Have a wonderful weekend.

## 2020-11-04 NOTE — Telephone Encounter (Signed)
Pt called back she actually responded on 10/18/2020 via my chart with the following:  Hi Dr. Metheney, hope you are doing well.  I promised to follow up after seeing my endocrinologist.  She doesn't recommend at statin drug at this time.  She agreed with the Ozempic and said that's what she would have given me.  I seemed to be doing okay, but blood sugars are still a little high in the mornings.  A1C is 6.9.  Dr. Khan, wants to give me IV iron for two weeks, starting this Friday, then next Friday.  She also wants to do a PET scan and a bone marrow biopsy.  I will try to comply...(smile). Thanks, Donna Dyer 

## 2020-11-06 DIAGNOSIS — D72822 Plasmacytosis: Secondary | ICD-10-CM | POA: Diagnosis not present

## 2020-11-06 DIAGNOSIS — D472 Monoclonal gammopathy: Secondary | ICD-10-CM | POA: Diagnosis not present

## 2020-11-18 DIAGNOSIS — Z23 Encounter for immunization: Secondary | ICD-10-CM | POA: Diagnosis not present

## 2020-11-19 DIAGNOSIS — C9000 Multiple myeloma not having achieved remission: Secondary | ICD-10-CM | POA: Diagnosis not present

## 2020-11-19 DIAGNOSIS — D472 Monoclonal gammopathy: Secondary | ICD-10-CM | POA: Diagnosis not present

## 2020-12-02 DIAGNOSIS — D472 Monoclonal gammopathy: Secondary | ICD-10-CM | POA: Diagnosis not present

## 2020-12-09 ENCOUNTER — Other Ambulatory Visit: Payer: Self-pay | Admitting: Family Medicine

## 2020-12-17 DIAGNOSIS — G4733 Obstructive sleep apnea (adult) (pediatric): Secondary | ICD-10-CM | POA: Diagnosis not present

## 2020-12-17 DIAGNOSIS — Z9989 Dependence on other enabling machines and devices: Secondary | ICD-10-CM | POA: Diagnosis not present

## 2020-12-25 DIAGNOSIS — Z1231 Encounter for screening mammogram for malignant neoplasm of breast: Secondary | ICD-10-CM | POA: Diagnosis not present

## 2021-01-08 ENCOUNTER — Other Ambulatory Visit: Payer: Self-pay

## 2021-01-08 ENCOUNTER — Ambulatory Visit (INDEPENDENT_AMBULATORY_CARE_PROVIDER_SITE_OTHER): Payer: BC Managed Care – PPO | Admitting: Physician Assistant

## 2021-01-08 VITALS — BP 131/68 | HR 82

## 2021-01-08 DIAGNOSIS — Z23 Encounter for immunization: Secondary | ICD-10-CM | POA: Diagnosis not present

## 2021-01-08 NOTE — Progress Notes (Signed)
Pt here for shingles vaccine.  Charyl Bigger, CMA

## 2021-01-12 NOTE — Progress Notes (Signed)
Agree with above plan. 

## 2021-01-20 NOTE — Progress Notes (Signed)
HPI:FU hypertension. Echocardiogram in October of 2012 showed normal LV function, grade 1 diastolic dysfunction, and mild left atrial enlargement. She states HCTZ caused "cramps". Since last seen, the patient has dyspnea with more extreme activities but not with routine activities. It is relieved with rest. It is not associated with chest pain. There is no orthopnea, PND or pedal edema. There is no syncope or palpitations. There is no exertional chest pain.   Current Outpatient Medications  Medication Sig Dispense Refill   Acetaminophen (TYLENOL EXTRA STRENGTH PO) Take 2 tablets by mouth as needed.     clobetasol (TEMOVATE) 0.05 % external solution Apply 1 application topically 2 (two) times daily.     fluocinonide (LIDEX) 0.05 % external solution Apply 1 application topically 2 (two) times daily. To insect bites     fluticasone (FLONASE) 50 MCG/ACT nasal spray Place into both nostrils as needed.      glucose blood test strip      loratadine (CLARITIN) 10 MG tablet Take 10 mg by mouth daily.     losartan (COZAAR) 100 MG tablet Take 1 tablet (100 mg total) by mouth daily. 90 tablet 1   Multiple Vitamin (MULTIVITAMIN) tablet Take 1 tablet by mouth daily.     Nebivolol HCl 20 MG TABS Take 1 tablet (20 mg total) by mouth daily. 90 tablet 1   Semaglutide,0.25 or 0.5MG /DOS, (OZEMPIC, 0.25 OR 0.5 MG/DOSE,) 2 MG/1.5ML SOPN Inject 0.5 mg into the skin once a week. 3 mL 1   spironolactone (ALDACTONE) 25 MG tablet Take 0.5 tablets (12.5 mg total) by mouth daily. 45 tablet 1   triamcinolone cream (KENALOG) 0.1 % Apply 1 application topically 2 (two) times daily. (Patient taking differently: Apply 1 application topically as needed.) 30 g 0   verapamil (CALAN-SR) 180 MG CR tablet Take 0.5 tablets (90 mg total) by mouth daily. 45 tablet 1   Vitamin D, Ergocalciferol, (DRISDOL) 1.25 MG (50000 UNIT) CAPS capsule Take 1 capsule (50,000 Units total) by mouth every 7 (seven) days. 12 capsule 3   No current  facility-administered medications for this visit.     Past Medical History:  Diagnosis Date   Anemia    Asthma 11/30/2015   Bleeding hemorrhoids 06/05/2015   DDD (degenerative disc disease), cervical 11/30/2015   DDD (degenerative disc disease), lumbar 11/30/2015   Diabetes mellitus    Edema    LLE- podiatry in [place]   Endometriosis    Fibromyalgia 11/30/2015   Goiter    Hemorrhoids    Hyperlipidemia    Hypertension    Iron malabsorption 06/05/2015   Lumbosacral radiculopathy at S1 07/18/2014   Bilateral   MGUS (monoclonal gammopathy of unknown significance) 06/05/2015   Osteoarthritis of both hands 11/30/2015   Osteoarthritis of both knees 11/30/2015   Osteopenia    Osteoporosis 11/30/2015   Other iron deficiency anemias 06/05/2015   Pain, joint, hip, right    chronic- Dr Alvan Dame Dr Nelva Bush   Prediabetes    Pseudogout 11/30/2015   Sleep apnea     Past Surgical History:  Procedure Laterality Date   Back Injections     ESOPHAGOGASTRODUODENOSCOPY  7-11   for anemia   FLEXIBLE SIGMOIDOSCOPY  01/31/2011   Procedure: FLEXIBLE SIGMOIDOSCOPY;  Surgeon: Missy Sabins, MD;  Location: [facility];  Service: Endoscopy;  Laterality: N/A;   TOTAL ABDOMINAL HYSTERECTOMY  1995   bilat oophorectomy/ endometriosis   VULVAR LESION REMOVAL  09/07/2011   Procedure: VULVAR LESION;  Surgeon: Quillian Quince  Doylene Bode, MD;  Location: [facility];  Service: Gynecology;  Laterality: N/A;  vaginal lesion    Social History   Socioeconomic History   Marital status: Married    Spouse name: Not on file   Number of children: 2   Years of education: Not on file   Highest education level: Not on file  Occupational History   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 Ambulatory, no cane or walker. Works at [employer].    [employer].   Right handed.   Caffeine none.   Social Determinants of Radio broadcast assistant Strain: Not on file  Food Insecurity: Not on file  Transportation Needs: Not on file  Physical Activity: Not on file  Stress: Not on file  Social Connections: Not on file  Intimate Partner Violence: Not on file    Family History  Problem Relation Age of Onset   Rheum arthritis Mother    Hypertension Mother    Cancer Father        bladder   Heart attack Father        MI at age 71   Diabetes Brother    Diabetes Brother    Diabetes Brother    Diabetes Brother    Sarcoidosis Brother    Sarcoidosis Sister    Diabetes Sister    Polycystic ovary syndrome Daughter    Hypertension Daughter    Anesthesia problems Neg Hx    Hypotension Neg Hx    Malignant hyperthermia Neg Hx    Pseudochol deficiency Neg Hx     ROS: no fevers or chills, productive cough, hemoptysis, dysphasia, odynophagia, melena, hematochezia, dysuria, hematuria, rash, seizure activity, orthopnea, PND, pedal edema, claudication. Remaining systems are negative.  Physical Exam: Well-developed well-nourished in no acute distress.  Skin is warm and dry.  HEENT is normal.  Neck is supple.  Chest is clear to auscultation with normal expansion.  Cardiovascular exam is regular rate and rhythm.  Abdominal exam nontender or distended. No masses palpated. Extremities show no edema. neuro grossly intact  ECG- NSR, no ST changes; personally reviewed  A/P  1 hypertension-patient's blood pressure is controlled.  Continue present medications.    2 history of hyperlipidemia-last LDL 90.  She does have a history of diabetes mellitus.  We will arrange a calcium score.  If  elevated will add statin therapy.  3 OSA-continue CPAP.  Kirk Ruths, MD

## 2021-02-01 ENCOUNTER — Encounter: Payer: Self-pay | Admitting: Family Medicine

## 2021-02-01 ENCOUNTER — Other Ambulatory Visit: Payer: Self-pay

## 2021-02-01 ENCOUNTER — Encounter: Payer: Self-pay | Admitting: Cardiology

## 2021-02-01 ENCOUNTER — Ambulatory Visit (INDEPENDENT_AMBULATORY_CARE_PROVIDER_SITE_OTHER): Payer: BC Managed Care – PPO | Admitting: Cardiology

## 2021-02-01 VITALS — BP 138/86 | HR 78 | Ht 66.0 in | Wt 221.1 lb

## 2021-02-01 DIAGNOSIS — I10 Essential (primary) hypertension: Secondary | ICD-10-CM

## 2021-02-01 DIAGNOSIS — Z136 Encounter for screening for cardiovascular disorders: Secondary | ICD-10-CM | POA: Diagnosis not present

## 2021-02-01 DIAGNOSIS — G4733 Obstructive sleep apnea (adult) (pediatric): Secondary | ICD-10-CM

## 2021-02-01 DIAGNOSIS — E7800 Pure hypercholesterolemia, unspecified: Secondary | ICD-10-CM | POA: Diagnosis not present

## 2021-02-01 MED ORDER — OZEMPIC (0.25 OR 0.5 MG/DOSE) 2 MG/1.5ML ~~LOC~~ SOPN: 0.5000 mg | PEN_INJECTOR | SUBCUTANEOUS | 0 refills | Status: DC

## 2021-02-01 NOTE — Patient Instructions (Signed)
°  Testing/Procedures:  CORONARY CALCIUM SCORING CT SCAN AT THE [facility] OFFICE   Follow-Up: At [facility], you and your health needs are our priority.  As part of our continuing mission to provide you with exceptional heart care, we have created designated Provider Care Teams.  These Care Teams include your primary Cardiologist (physician) and Advanced Practice Providers (APPs -  Physician Assistants and Nurse Practitioners) who all work together to provide you with the care you need, when you need it.  We recommend signing up for the patient portal called "MyChart".  Sign up information is provided on this After Visit Summary.  MyChart is used to connect with patients for Virtual Visits (Telemedicine).  Patients are able to view lab/test results, encounter notes, upcoming appointments, etc.  Non-urgent messages can be sent to your provider as well.   To learn more about what you can do with MyChart, go to [email or web address].    Your next appointment:   12 month(s)  The format for your next appointment:   In Person  Provider:   Kirk Ruths MD

## 2021-02-04 ENCOUNTER — Other Ambulatory Visit: Payer: Self-pay

## 2021-02-04 ENCOUNTER — Ambulatory Visit (INDEPENDENT_AMBULATORY_CARE_PROVIDER_SITE_OTHER): Payer: Self-pay

## 2021-02-04 DIAGNOSIS — H04123 Dry eye syndrome of bilateral lacrimal glands: Secondary | ICD-10-CM | POA: Diagnosis not present

## 2021-02-04 DIAGNOSIS — H40013 Open angle with borderline findings, low risk, bilateral: Secondary | ICD-10-CM | POA: Diagnosis not present

## 2021-02-04 DIAGNOSIS — Z136 Encounter for screening for cardiovascular disorders: Secondary | ICD-10-CM

## 2021-02-04 DIAGNOSIS — H2513 Age-related nuclear cataract, bilateral: Secondary | ICD-10-CM | POA: Diagnosis not present

## 2021-02-04 DIAGNOSIS — H43811 Vitreous degeneration, right eye: Secondary | ICD-10-CM | POA: Diagnosis not present

## 2021-02-04 LAB — HM DIABETES EYE EXAM

## 2021-02-09 ENCOUNTER — Encounter: Payer: Self-pay | Admitting: Family Medicine

## 2021-02-09 LAB — HEMOGLOBIN A1C: Hemoglobin A1C: 6.6

## 2021-02-09 LAB — TSH: TSH: 1.63 (ref 0.41–5.90)

## 2021-02-09 LAB — VITAMIN D 25 HYDROXY (VIT D DEFICIENCY, FRACTURES): Vit D, 25-Hydroxy: 45

## 2021-02-09 LAB — T3, FREE: Free T3: 2.69

## 2021-02-09 LAB — T4: T4, Total: 0.8

## 2021-02-09 NOTE — Progress Notes (Signed)
Hi Donna Dyer, great news!  Your coronary calcium score was 0 which is awesome.  No so no sign of plaque deposition in those arteries.  Schedule your diabetic follow-up soon.

## 2021-02-11 DIAGNOSIS — E042 Nontoxic multinodular goiter: Secondary | ICD-10-CM | POA: Diagnosis not present

## 2021-02-11 DIAGNOSIS — E559 Vitamin D deficiency, unspecified: Secondary | ICD-10-CM | POA: Diagnosis not present

## 2021-02-11 DIAGNOSIS — E119 Type 2 diabetes mellitus without complications: Secondary | ICD-10-CM | POA: Diagnosis not present

## 2021-02-15 DIAGNOSIS — I10 Essential (primary) hypertension: Secondary | ICD-10-CM | POA: Diagnosis not present

## 2021-02-15 DIAGNOSIS — E782 Mixed hyperlipidemia: Secondary | ICD-10-CM | POA: Diagnosis not present

## 2021-02-15 DIAGNOSIS — E042 Nontoxic multinodular goiter: Secondary | ICD-10-CM | POA: Diagnosis not present

## 2021-02-15 DIAGNOSIS — E119 Type 2 diabetes mellitus without complications: Secondary | ICD-10-CM | POA: Diagnosis not present

## 2021-02-25 DIAGNOSIS — D472 Monoclonal gammopathy: Secondary | ICD-10-CM | POA: Diagnosis not present

## 2021-02-25 DIAGNOSIS — C9000 Multiple myeloma not having achieved remission: Secondary | ICD-10-CM | POA: Diagnosis not present

## 2021-03-08 DIAGNOSIS — E042 Nontoxic multinodular goiter: Secondary | ICD-10-CM | POA: Diagnosis not present

## 2021-03-11 DIAGNOSIS — D509 Iron deficiency anemia, unspecified: Secondary | ICD-10-CM | POA: Diagnosis not present

## 2021-03-11 DIAGNOSIS — K909 Intestinal malabsorption, unspecified: Secondary | ICD-10-CM | POA: Diagnosis not present

## 2021-03-11 DIAGNOSIS — D508 Other iron deficiency anemias: Secondary | ICD-10-CM | POA: Diagnosis not present

## 2021-03-11 DIAGNOSIS — D472 Monoclonal gammopathy: Secondary | ICD-10-CM | POA: Diagnosis not present

## 2021-03-12 ENCOUNTER — Other Ambulatory Visit: Payer: Self-pay | Admitting: Cardiology

## 2021-03-12 DIAGNOSIS — D508 Other iron deficiency anemias: Secondary | ICD-10-CM | POA: Diagnosis not present

## 2021-03-12 DIAGNOSIS — D472 Monoclonal gammopathy: Secondary | ICD-10-CM | POA: Diagnosis not present

## 2021-03-12 DIAGNOSIS — C9000 Multiple myeloma not having achieved remission: Secondary | ICD-10-CM | POA: Diagnosis not present

## 2021-03-17 ENCOUNTER — Encounter: Payer: Self-pay | Admitting: Family Medicine

## 2021-03-19 ENCOUNTER — Ambulatory Visit: Payer: BC Managed Care – PPO | Admitting: Family Medicine

## 2021-03-19 ENCOUNTER — Encounter: Payer: Self-pay | Admitting: Family Medicine

## 2021-03-19 ENCOUNTER — Other Ambulatory Visit: Payer: Self-pay

## 2021-03-19 VITALS — BP 122/74 | HR 76 | Ht 66.0 in | Wt 219.0 lb

## 2021-03-19 DIAGNOSIS — E119 Type 2 diabetes mellitus without complications: Secondary | ICD-10-CM

## 2021-03-19 DIAGNOSIS — M79671 Pain in right foot: Secondary | ICD-10-CM | POA: Diagnosis not present

## 2021-03-19 DIAGNOSIS — I10 Essential (primary) hypertension: Secondary | ICD-10-CM

## 2021-03-19 NOTE — Assessment & Plan Note (Addendum)
Well controlled. Weight improving.  Continue to work on [employer] and exercise. Continue current regimen. Follow up in  6 mo  ?

## 2021-03-19 NOTE — Progress Notes (Signed)
o

## 2021-03-19 NOTE — Assessment & Plan Note (Signed)
Last A1C looked great with endocrinology.  Foot exam performed today.  Will get last eye exam.  ?

## 2021-03-19 NOTE — Progress Notes (Signed)
Acute Office Visit  Subjective:    Patient ID: Donna Dyer, female    DOB: Jan 18, 1960, 61 y.o.   MRN: [id number]  Chief Complaint  Patient presents with   Foot Pain    HPI Patient is in today for Right foot pain.  She actually had a CT with contrast for her head last week.  And that night started having a shooting electrical sensation in her third toe on her right foot.  She says it was really quite intense and even just water hitting it in the shower was really painful.  She said it gradually got better after a couple of days she does not member any specific injury or trauma.  Hypertension- Pt denies chest pain, SOB, dizziness, or heart palpitations.  Taking meds as directed w/o problems.  Denies medication side effects.    Diabetes-she is actually doing really well.  Her last A1c was under 7 she follows with endocrinology. We were able to get the ozempic approved and she has been doing well on it.  She is on ARB. Not on a statin. Marland Kitchen    Past Medical History:  Diagnosis Date   Anemia    Asthma 11/30/2015   Bleeding hemorrhoids 06/05/2015   DDD (degenerative disc disease), cervical 11/30/2015   DDD (degenerative disc disease), lumbar 11/30/2015   Diabetes mellitus    Edema    LLE- podiatry in [place]   Endometriosis    Fibromyalgia 11/30/2015   Goiter    Hemorrhoids    Hyperlipidemia    Hypertension    Iron malabsorption 06/05/2015   Lumbosacral radiculopathy at S1 07/18/2014   Bilateral   MGUS (monoclonal gammopathy of unknown significance) 06/05/2015   Osteoarthritis of both hands 11/30/2015   Osteoarthritis of both knees 11/30/2015   Osteopenia    Osteoporosis 11/30/2015   Other iron deficiency anemias 06/05/2015   Pain, joint, hip, right    chronic- Dr Alvan Dame Dr Nelva Bush   Prediabetes    Pseudogout 11/30/2015   Sleep apnea     Past Surgical History:  Procedure Laterality Date   Back Injections     ESOPHAGOGASTRODUODENOSCOPY  7-11   for anemia   FLEXIBLE  SIGMOIDOSCOPY  01/31/2011   Procedure: FLEXIBLE SIGMOIDOSCOPY;  Surgeon: Missy Sabins, MD;  Location: [facility];  Service: Endoscopy;  Laterality: N/A;   TOTAL ABDOMINAL HYSTERECTOMY  1995   bilat oophorectomy/ endometriosis   VULVAR LESION REMOVAL  09/07/2011   Procedure: VULVAR LESION;  Surgeon: Alvino Chapel, MD;  Location: [facility];  Service: Gynecology;  Laterality: N/A;  vaginal lesion    Family History  Problem Relation Age of Onset   Rheum arthritis Mother    Hypertension Mother    Cancer Father        bladder   Heart attack Father        MI at age 72   Diabetes Brother    Diabetes Brother    Diabetes Brother    Diabetes Brother    Sarcoidosis Brother    Sarcoidosis Sister    Diabetes Sister    Polycystic ovary syndrome Daughter    Hypertension Daughter    Anesthesia problems Neg Hx    Hypotension Neg Hx    Malignant hyperthermia Neg Hx    Pseudochol deficiency Neg Hx     Social History   Socioeconomic History   Marital status: Married    Spouse name: Not on file   Number of children: 2   Years  of education: Not on file   Highest education level: Not on file  Occupational History   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 Ambulatory, no cane or walker. Works at [employer].    [employer].   Right handed.   Caffeine none.   Social Determinants of Health   Financial Resource Strain: Not on file  Food Insecurity: Not on file  Transportation Needs: Not on file  Physical Activity: Not on file  Stress: Not on file   Social Connections: Not on file  Intimate Partner Violence: Not on file    Outpatient Medications Prior to Visit  Medication Sig Dispense Refill   Acetaminophen (TYLENOL EXTRA STRENGTH PO) Take 2 tablets by mouth as needed.     clobetasol (TEMOVATE) 0.05 % external solution Apply 1 application topically 2 (two) times daily.     fluocinonide (LIDEX) 0.05 % external solution Apply 1 application topically 2 (two) times daily. To insect bites     fluticasone (FLONASE) 50 MCG/ACT nasal spray Place into both nostrils as needed.      glucose blood test strip      loratadine (CLARITIN) 10 MG tablet Take 10 mg by mouth daily.     losartan (COZAAR) 100 MG tablet TAKE 1 TABLET DAILY 90 tablet 3   Multiple Vitamin (MULTIVITAMIN) tablet Take 1 tablet by mouth daily.     Nebivolol HCl 20 MG TABS TAKE 1 TABLET DAILY 90 tablet 3   Semaglutide,0.25 or 0.5MG /DOS, (OZEMPIC, 0.25 OR 0.5 MG/DOSE,) 2 MG/1.5ML SOPN Inject 0.5 mg into the skin once a week. 9 mL 0   spironolactone (ALDACTONE) 25 MG tablet TAKE ONE-HALF (1/2) TABLET DAILY 45 tablet 3   triamcinolone cream (KENALOG) 0.1 % Apply 1 application topically 2 (two) times daily. (Patient taking differently: Apply 1 application topically as needed.) 30 g 0   verapamil (CALAN-SR) 180 MG CR tablet TAKE ONE-HALF (1/2) TABLET DAILY 45 tablet 3   Vitamin D, Ergocalciferol, (DRISDOL) 1.25 MG (50000 UNIT) CAPS capsule Take 1 capsule (50,000 Units total) by mouth every 7 (seven) days. 12 capsule 3   No facility-administered medications prior to visit.    Allergies  Allergen Reactions   Sulfamethoxazole Rash   Pineapple Itching and Swelling    Tongue swells Tongue swells   Sulfasalazine Rash   Avapro [Irbesartan] Other (See Comments)   Onglyza [Saxagliptin] Itching   Spironolactone Other (See Comments)    Dizzy and nauseated.  Dizzy and nauseated.  Dizzy and nauseated.  **currently taking w/ better toleration    Valsartan Other (See Comments)    Leg  cramps  Leg cramps **Currently taking w/ better toleration 07/12/17   Amlodipine Besylate Other (See Comments)    muscle cramps   Amlodipine Besylate Other (See Comments)    muscle cramps   Asparagus Itching    Of the mouth   Azithromycin Itching and Rash   Chlorthalidone Other (See Comments)    Muscle cramps Muscle cramps   Codeine Nausea And Vomiting  Dulaglutide Itching    ? Hypersensitivity reaction ? Hypersensitivity reaction   Hydrochlorothiazide Other (See Comments)    Muscle spasms   Muscle spasms    Metformin Diarrhea    Diarrhea when started on metformin IR 100mg  daily Diarrhea when started on metformin IR 100mg  daily Diarrhea when started on metformin IR 100mg  daily Diarrhea when started on metformin IR 100mg  daily   Metformin And Related Diarrhea   Sulfa Antibiotics Rash    Other reaction(s): RASH Other reaction(s): RASH    Sulfonamide Derivatives Rash    Review of Systems     Objective:    Physical Exam Vitals and nursing note reviewed.  Constitutional:      Appearance: She is well-developed.  HENT:     Head: Normocephalic and atraumatic.  Cardiovascular:     Rate and Rhythm: Normal rate and regular rhythm.     Heart sounds: Normal heart sounds.  Pulmonary:     Effort: Pulmonary effort is normal.     Breath sounds: Normal breath sounds.  Musculoskeletal:     Comments: Third toe on the right foot has a normal appearance no rash swelling bruising.  Nontender on exam.  Normal flexion and extension.  She does have a small little hard sessile area laterally along the toe.  It feels hard like bone  Skin:    General: Skin is warm and dry.  Neurological:     Mental Status: She is alert and oriented to person, place, and time.  Psychiatric:        Behavior: Behavior normal.    BP 122/74    Pulse 76    Ht 5\' 6"  (1.676 m)    Wt 219 lb (99.3 kg)    SpO2 100%    BMI 35.35 kg/m  Wt Readings from Last 3 Encounters:  03/19/21 219 lb (99.3 kg)  02/01/21  221 lb 1.9 oz (100.3 kg)  09/24/20 230 lb (104.3 kg)    Health Maintenance Due  Topic Date Due   COLONOSCOPY (Pts 45-19yrs Insurance coverage will need to be confirmed)  05/11/2018    There are no preventive care reminders to display for this patient.   Lab Results  Component Value Date   TSH 1.63 02/09/2021   Lab Results  Component Value Date   WBC 7.2 10/20/2017   HGB 10.1 (L) 10/20/2017   HCT 32.1 (L) 10/20/2017   MCV 87.5 10/20/2017   PLT 273 10/20/2017   Lab Results  Component Value Date   NA 138 01/29/2020   K 4.8 01/29/2020   CHLORIDE 101 06/10/2016   CO2 24 01/29/2020   GLUCOSE 113 (H) 01/29/2020   BUN 21 01/29/2020   CREATININE 1.28 (H) 01/29/2020   BILITOT 0.7 10/20/2017   ALKPHOS 118 10/20/2017   AST 20 10/20/2017   ALT 19 10/20/2017   PROT 8.5 (H) 10/20/2017   ALBUMIN 4.1 09/05/2018   CALCIUM 9.7 01/29/2020   ANIONGAP 10 10/20/2017   EGFR 60 (L) 06/10/2016   GFR 73.96 10/22/2010   Lab Results  Component Value Date   CHOL 145 01/16/2015   Lab Results  Component Value Date   HDL 52 01/16/2015   Lab Results  Component Value Date   LDLCALC 55 01/16/2015   Lab Results  Component Value Date   TRIG 191 (A) 01/16/2015   Lab Results  Component Value Date   CHOLHDL 4.7 03/15/2013   Lab Results  Component Value Date   HGBA1C 6.6 02/09/2021  Assessment & Plan:   Problem List Items Addressed This Visit       Cardiovascular and Mediastinum   Essential hypertension, benign    Well controlled. Weight improving.  Continue to work on [employer] and exercise. Continue current regimen. Follow up in  6 mo         Endocrine   Diabetes mellitus type 2, controlled, without complications ([place])    Last A1C looked great with endocrinology.  Foot exam performed today.  Will get last eye exam.       Other Visit Diagnoses     Right foot pain    -  Primary      Right foot pain - unclear etiology. Occurred night after receiving contrast.   No sign of trauma or inflammation or vascular issues. Pain has resolved on its own. Call if pain recurs.   No orders of the defined types were placed in this encounter.    Beatrice Lecher, MD

## 2021-03-24 DIAGNOSIS — D472 Monoclonal gammopathy: Secondary | ICD-10-CM | POA: Diagnosis not present

## 2021-03-25 ENCOUNTER — Ambulatory Visit: Payer: BC Managed Care – PPO | Admitting: Family Medicine

## 2021-06-01 DIAGNOSIS — G4733 Obstructive sleep apnea (adult) (pediatric): Secondary | ICD-10-CM | POA: Diagnosis not present

## 2021-06-09 DIAGNOSIS — Z1272 Encounter for screening for malignant neoplasm of vagina: Secondary | ICD-10-CM | POA: Diagnosis not present

## 2021-06-09 DIAGNOSIS — N952 Postmenopausal atrophic vaginitis: Secondary | ICD-10-CM | POA: Diagnosis not present

## 2021-06-09 DIAGNOSIS — Z6835 Body mass index (BMI) 35.0-35.9, adult: Secondary | ICD-10-CM | POA: Diagnosis not present

## 2021-06-09 DIAGNOSIS — Z01419 Encounter for gynecological examination (general) (routine) without abnormal findings: Secondary | ICD-10-CM | POA: Diagnosis not present

## 2021-06-09 DIAGNOSIS — Z87412 Personal history of vulvar dysplasia: Secondary | ICD-10-CM | POA: Diagnosis not present

## 2021-07-02 DIAGNOSIS — G4733 Obstructive sleep apnea (adult) (pediatric): Secondary | ICD-10-CM | POA: Diagnosis not present

## 2021-07-12 DIAGNOSIS — H00021 Hordeolum internum right upper eyelid: Secondary | ICD-10-CM | POA: Diagnosis not present

## 2021-07-19 DIAGNOSIS — D472 Monoclonal gammopathy: Secondary | ICD-10-CM | POA: Diagnosis not present

## 2021-07-23 DIAGNOSIS — H00021 Hordeolum internum right upper eyelid: Secondary | ICD-10-CM | POA: Diagnosis not present

## 2021-07-26 DIAGNOSIS — D472 Monoclonal gammopathy: Secondary | ICD-10-CM | POA: Diagnosis not present

## 2021-08-01 DIAGNOSIS — G4733 Obstructive sleep apnea (adult) (pediatric): Secondary | ICD-10-CM | POA: Diagnosis not present

## 2021-08-12 ENCOUNTER — Encounter (INDEPENDENT_AMBULATORY_CARE_PROVIDER_SITE_OTHER): Payer: BC Managed Care – PPO | Admitting: Family Medicine

## 2021-08-12 DIAGNOSIS — U071 COVID-19: Secondary | ICD-10-CM | POA: Insufficient documentation

## 2021-08-12 NOTE — Telephone Encounter (Signed)

## 2021-08-24 ENCOUNTER — Telehealth: Payer: Self-pay

## 2021-08-24 MED ORDER — VITAMIN D (ERGOCALCIFEROL) 1.25 MG (50000 UNIT) PO CAPS
50000.0000 [IU] | ORAL_CAPSULE | ORAL | 3 refills | Status: AC
Start: 2021-08-24 — End: ?

## 2021-08-24 NOTE — Telephone Encounter (Signed)
[facility] is requesting a med refill for Vit D 50,000u. Please advise, if appropriate. Thanks.

## 2021-08-24 NOTE — Telephone Encounter (Signed)
Please call patient and let her know that we received a refill request for her vitamin D.  Please let her know that I did change the instructions so I want her to take 1 capsule every 2 weeks instead of once a week.  Especially since her last vitamin D looked great.  We do not want her taking too much or can actually be dangerous.  We can always recheck her vitamin D when I see her next time.

## 2021-08-25 NOTE — Telephone Encounter (Signed)
Left a detailed vm msg for patient regarding provider's note. Patient informed of provider's recommendation. Direct call back info provided.

## 2021-09-20 ENCOUNTER — Ambulatory Visit: Payer: BC Managed Care – PPO | Admitting: Family Medicine

## 2021-09-20 ENCOUNTER — Encounter: Payer: Self-pay | Admitting: Family Medicine

## 2021-09-20 VITALS — BP 122/80 | HR 77 | Wt 218.0 lb

## 2021-09-20 DIAGNOSIS — E119 Type 2 diabetes mellitus without complications: Secondary | ICD-10-CM

## 2021-09-20 DIAGNOSIS — I10 Essential (primary) hypertension: Secondary | ICD-10-CM

## 2021-09-20 DIAGNOSIS — R7989 Other specified abnormal findings of blood chemistry: Secondary | ICD-10-CM

## 2021-09-20 DIAGNOSIS — N1832 Chronic kidney disease, stage 3b: Secondary | ICD-10-CM | POA: Diagnosis not present

## 2021-09-20 LAB — POCT GLYCOSYLATED HEMOGLOBIN (HGB A1C): Hemoglobin A1C: 5.9 % — AB (ref 4.0–5.6)

## 2021-09-20 NOTE — Assessment & Plan Note (Signed)
I did reevaluate back and review that her kidney function has been elevated in that CKD 3 range for the last several years.  It looks like there might be a slight trend upward towards 1.3 or 1.4.  We discussed getting an updated ultrasound and maybe some additional labs.  She does have MGUS as well as a history of iron deficiency and diabetes.

## 2021-09-20 NOTE — Assessment & Plan Note (Addendum)
At goal.  Keep up the great work. Lab Results  Component Value Date   HGBA1C 5.9 (A) 09/20/2021

## 2021-09-20 NOTE — Assessment & Plan Note (Signed)
Well controlled. Continue current regimen. Follow up in  6 mo  

## 2021-09-20 NOTE — Progress Notes (Signed)
Established Patient Office Visit  Subjective   Patient ID: Donna Dyer, female    DOB: Feb 10, 1960  Age: 61 y.o. MRN: [id number]  Chief Complaint  Patient presents with   Hypertension    HPI  Diabetes - no hypoglycemic events. No wounds or sores that are not healing well. No increased thirst or urination. Checking glucose at home. Taking medications as prescribed without any side effects.  Hypertension- Pt denies chest pain, SOB, dizziness, or heart palpitations.  Taking meds as directed w/o problems.  Denies medication side effects.    She also wanted to discuss her elevated serum creatinine levels.  She is concerned as she has had 2 family members with renal failure.    Lab Results  Component Value Date   CREATININE 1.28 (H) 01/29/2020   07/19/20: 1.44, GFR 41 03/11/2021: 1.3 12/02/20: 1.4 06/05/20: 1.18    ROS    Objective:     BP 122/80   Pulse 77   Wt 218 lb (98.9 kg)   SpO2 100%   BMI 35.19 kg/m    Physical Exam Vitals and nursing note reviewed.  Constitutional:      Appearance: She is well-developed.  HENT:     Head: Normocephalic and atraumatic.  Cardiovascular:     Rate and Rhythm: Normal rate and regular rhythm.     Heart sounds: Normal heart sounds.  Pulmonary:     Effort: Pulmonary effort is normal.     Breath sounds: Normal breath sounds.  Skin:    General: Skin is warm and dry.  Neurological:     Mental Status: She is alert and oriented to person, place, and time.  Psychiatric:        Behavior: Behavior normal.      Results for orders placed or performed in visit on 09/20/21  POCT glycosylated hemoglobin (Hb A1C)  Result Value Ref Range   Hemoglobin A1C 5.9 (A) 4.0 - 5.6 %   HbA1c POC (<> result, manual entry)     HbA1c, POC (prediabetic range)     HbA1c, POC (controlled diabetic range)        The 10-year ASCVD risk score (Arnett DK, et al., 2019) is: 17.3%    Assessment & Plan:   Problem List Items Addressed This Visit        Cardiovascular and Mediastinum   Essential hypertension, benign    Well controlled. Continue current regimen. Follow up in  6 mo       Relevant Orders   Hepatitis C antibody   HIV Antibody (routine testing w rflx)   Urine Microscopic   Microalbumin / creatinine urine ratio   ANA   C-reactive protein   Sedimentation rate   CBC with Differential/Platelet     Endocrine   Diabetes mellitus type 2, controlled, without complications ([place]) - Primary    At goal.  Keep up the great work. Lab Results  Component Value Date   HGBA1C 5.9 (A) 09/20/2021         Relevant Orders   POCT glycosylated hemoglobin (Hb A1C) (Completed)   Hepatitis C antibody   HIV Antibody (routine testing w rflx)   Urine Microscopic   Microalbumin / creatinine urine ratio   ANA   C-reactive protein   Sedimentation rate   CBC with Differential/Platelet     Genitourinary   Stage 3b chronic kidney disease ([place])    I did reevaluate back and review that her kidney function has been elevated in that CKD 3  range for the last several years.  It looks like there might be a slight trend upward towards 1.3 or 1.4.  We discussed getting an updated ultrasound and maybe some additional labs.  She does have MGUS as well as a history of iron deficiency and diabetes.      Relevant Orders   Hepatitis C antibody   HIV Antibody (routine testing w rflx)   Urine Microscopic   Microalbumin / creatinine urine ratio   ANA   C-reactive protein   Sedimentation rate   CBC with Differential/Platelet   Other Visit Diagnoses     Elevated serum creatinine       Relevant Orders   US Renal   Hepatitis C antibody   HIV Antibody (routine testing w rflx)   Urine Microscopic   Microalbumin / creatinine urine ratio   ANA   C-reactive protein   Sedimentation rate   CBC with Differential/Platelet       Return in about 4 months (around 01/20/2022) for Diabetes follow-up, Hypertension.    Beatrice Lecher, MD

## 2021-09-27 ENCOUNTER — Ambulatory Visit (INDEPENDENT_AMBULATORY_CARE_PROVIDER_SITE_OTHER): Payer: BC Managed Care – PPO

## 2021-09-27 DIAGNOSIS — R7989 Other specified abnormal findings of blood chemistry: Secondary | ICD-10-CM

## 2021-09-29 NOTE — Progress Notes (Signed)
Schylar, great news is that the ultrasound looks good kidneys look reassuring.  Try to come by and do the extra lab work when you get a chance.

## 2021-09-30 DIAGNOSIS — I10 Essential (primary) hypertension: Secondary | ICD-10-CM | POA: Diagnosis not present

## 2021-09-30 DIAGNOSIS — R7989 Other specified abnormal findings of blood chemistry: Secondary | ICD-10-CM | POA: Diagnosis not present

## 2021-09-30 DIAGNOSIS — E119 Type 2 diabetes mellitus without complications: Secondary | ICD-10-CM | POA: Diagnosis not present

## 2021-09-30 DIAGNOSIS — N1832 Chronic kidney disease, stage 3b: Secondary | ICD-10-CM | POA: Diagnosis not present

## 2021-10-01 NOTE — Progress Notes (Signed)
Hi Donna Dyer, your hemoglobin is dropped slightly normally around 10 or 11 and it was down to 9.7.  Negative for HIV and hepatitis C.  Sed rate which is your inflammatory marker was 72 that is about the range that it was in about 3 years ago.  Please call the lab and see if we can add a serum creatinine just to recheck it.

## 2021-10-04 LAB — CBC WITH DIFFERENTIAL/PLATELET
Absolute Monocytes: 416 cells/uL (ref 200–950)
Basophils Absolute: 7 cells/uL (ref 0–200)
Basophils Relative: 0.1 %
Eosinophils Absolute: 80 cells/uL (ref 15–500)
Eosinophils Relative: 1.1 %
HCT: 29 % — ABNORMAL LOW (ref 35.0–45.0)
Hemoglobin: 9.7 g/dL — ABNORMAL LOW (ref 11.7–15.5)
Lymphs Abs: 2263 cells/uL (ref 850–3900)
MCH: 28.5 pg (ref 27.0–33.0)
MCHC: 33.4 g/dL (ref 32.0–36.0)
MCV: 85.3 fL (ref 80.0–100.0)
MPV: 10 fL (ref 7.5–12.5)
Monocytes Relative: 5.7 %
Neutro Abs: 4533 cells/uL (ref 1500–7800)
Neutrophils Relative %: 62.1 %
Platelets: 314 10*3/uL (ref 140–400)
RBC: 3.4 10*6/uL — ABNORMAL LOW (ref 3.80–5.10)
RDW: 13.1 % (ref 11.0–15.0)
Total Lymphocyte: 31 %
WBC: 7.3 10*3/uL (ref 3.8–10.8)

## 2021-10-04 LAB — URINALYSIS, MICROSCOPIC ONLY
Bacteria, UA: NONE SEEN /HPF
RBC / HPF: NONE SEEN /HPF (ref 0–2)
WBC, UA: NONE SEEN /HPF (ref 0–5)

## 2021-10-04 LAB — HIV ANTIBODY (ROUTINE TESTING W REFLEX): HIV 1&2 Ab, 4th Generation: NONREACTIVE

## 2021-10-04 LAB — SEDIMENTATION RATE: Sed Rate: 72 mm/h — ABNORMAL HIGH (ref 0–30)

## 2021-10-04 LAB — ANA: Anti Nuclear Antibody (ANA): NEGATIVE

## 2021-10-04 LAB — MICROALBUMIN / CREATININE URINE RATIO
Creatinine, Urine: 37 mg/dL (ref 20–275)
Microalb, Ur: <0.2 mg/dL

## 2021-10-04 LAB — CREATININE, SERUM: Creat: 1.33 mg/dL — ABNORMAL HIGH (ref 0.50–1.05)

## 2021-10-04 LAB — HEPATITIS C ANTIBODY: Hepatitis C Ab: NONREACTIVE

## 2021-10-04 LAB — C-REACTIVE PROTEIN: CRP: 20.7 mg/L — ABNORMAL HIGH (ref <8.0)

## 2021-10-04 NOTE — Progress Notes (Signed)
Kidney function stable. Looks better than in July

## 2021-10-18 ENCOUNTER — Ambulatory Visit (INDEPENDENT_AMBULATORY_CARE_PROVIDER_SITE_OTHER): Payer: BC Managed Care – PPO | Admitting: Family Medicine

## 2021-10-18 DIAGNOSIS — Z23 Encounter for immunization: Secondary | ICD-10-CM | POA: Diagnosis not present

## 2021-11-02 DIAGNOSIS — E669 Obesity, unspecified: Secondary | ICD-10-CM | POA: Diagnosis not present

## 2021-11-02 DIAGNOSIS — L819 Disorder of pigmentation, unspecified: Secondary | ICD-10-CM | POA: Diagnosis not present

## 2021-11-02 DIAGNOSIS — L503 Dermatographic urticaria: Secondary | ICD-10-CM | POA: Diagnosis not present

## 2021-11-16 DIAGNOSIS — I10 Essential (primary) hypertension: Secondary | ICD-10-CM | POA: Diagnosis not present

## 2021-11-16 DIAGNOSIS — E042 Nontoxic multinodular goiter: Secondary | ICD-10-CM | POA: Diagnosis not present

## 2021-11-16 DIAGNOSIS — E782 Mixed hyperlipidemia: Secondary | ICD-10-CM | POA: Diagnosis not present

## 2021-11-16 DIAGNOSIS — E119 Type 2 diabetes mellitus without complications: Secondary | ICD-10-CM | POA: Diagnosis not present

## 2021-12-08 DIAGNOSIS — D472 Monoclonal gammopathy: Secondary | ICD-10-CM | POA: Diagnosis not present

## 2021-12-08 DIAGNOSIS — K909 Intestinal malabsorption, unspecified: Secondary | ICD-10-CM | POA: Diagnosis not present

## 2021-12-15 DIAGNOSIS — D472 Monoclonal gammopathy: Secondary | ICD-10-CM | POA: Diagnosis not present

## 2021-12-17 DIAGNOSIS — D472 Monoclonal gammopathy: Secondary | ICD-10-CM | POA: Diagnosis not present

## 2021-12-17 LAB — HM MAMMOGRAPHY

## 2021-12-27 DIAGNOSIS — I129 Hypertensive chronic kidney disease with stage 1 through stage 4 chronic kidney disease, or unspecified chronic kidney disease: Secondary | ICD-10-CM | POA: Diagnosis not present

## 2021-12-27 DIAGNOSIS — E785 Hyperlipidemia, unspecified: Secondary | ICD-10-CM | POA: Diagnosis not present

## 2021-12-27 DIAGNOSIS — E1121 Type 2 diabetes mellitus with diabetic nephropathy: Secondary | ICD-10-CM | POA: Diagnosis not present

## 2021-12-27 DIAGNOSIS — N1831 Chronic kidney disease, stage 3a: Secondary | ICD-10-CM | POA: Diagnosis not present

## 2021-12-27 DIAGNOSIS — Z1231 Encounter for screening mammogram for malignant neoplasm of breast: Secondary | ICD-10-CM | POA: Diagnosis not present

## 2022-01-13 DIAGNOSIS — G4733 Obstructive sleep apnea (adult) (pediatric): Secondary | ICD-10-CM | POA: Diagnosis not present

## 2022-01-20 ENCOUNTER — Ambulatory Visit: Payer: BC Managed Care – PPO | Admitting: Family Medicine

## 2022-01-20 ENCOUNTER — Encounter: Payer: Self-pay | Admitting: Family Medicine

## 2022-01-20 VITALS — BP 132/68 | HR 65 | Ht 66.0 in | Wt 222.0 lb

## 2022-01-20 DIAGNOSIS — D472 Monoclonal gammopathy: Secondary | ICD-10-CM

## 2022-01-20 DIAGNOSIS — N1832 Chronic kidney disease, stage 3b: Secondary | ICD-10-CM

## 2022-01-20 DIAGNOSIS — E119 Type 2 diabetes mellitus without complications: Secondary | ICD-10-CM

## 2022-01-20 DIAGNOSIS — I10 Essential (primary) hypertension: Secondary | ICD-10-CM

## 2022-01-20 DIAGNOSIS — Z1211 Encounter for screening for malignant neoplasm of colon: Secondary | ICD-10-CM

## 2022-01-20 DIAGNOSIS — K649 Unspecified hemorrhoids: Secondary | ICD-10-CM

## 2022-01-20 LAB — POCT GLYCOSYLATED HEMOGLOBIN (HGB A1C): Hemoglobin A1C: 7.9 % — AB (ref 4.0–5.6)

## 2022-01-20 MED ORDER — ATORVASTATIN CALCIUM 20 MG PO TABS: 20.0000 mg | ORAL_TABLET | Freq: Every day | ORAL | 3 refills | Status: DC

## 2022-01-20 NOTE — Assessment & Plan Note (Addendum)
Well controlled. Continue current regimen. Follow up in  6 mo  

## 2022-01-20 NOTE — Assessment & Plan Note (Signed)
Recently had updated labs with Oncology.  Stable.

## 2022-01-20 NOTE — Assessment & Plan Note (Signed)
Has f/u with Nephrology next week. Consider Wilder Glade

## 2022-01-20 NOTE — Progress Notes (Signed)
Established Patient Office Visit  Subjective   Patient ID: Donna Dyer, female    DOB: 11-Jul-1960  Age: 62 y.o. MRN: [id number]  Chief Complaint  Patient presents with   Diabetes    HPI  Hypertension- Pt denies chest pain, SOB, dizziness, or heart palpitations.  Taking meds as directed w/o problems.  Denies medication side effects.    Diabetes - no hypoglycemic events. No wounds or sores that are not healing well. No increased thirst or urination. Checking glucose at home. Taking medications as prescribed without any side effects.     ROS    Objective:     BP 132/68   Pulse 65   Ht '5\' 6"'$  (1.676 m)   Wt 222 lb (100.7 kg)   SpO2 100%   BMI 35.83 kg/m    Physical Exam Vitals and nursing note reviewed.  Constitutional:      Appearance: She is well-developed.  HENT:     Head: Normocephalic and atraumatic.  Cardiovascular:     Rate and Rhythm: Normal rate and regular rhythm.     Heart sounds: Normal heart sounds.  Pulmonary:     Effort: Pulmonary effort is normal.     Breath sounds: Normal breath sounds.  Skin:    General: Skin is warm and dry.  Neurological:     Mental Status: She is alert and oriented to person, place, and time.  Psychiatric:        Behavior: Behavior normal.      Results for orders placed or performed in visit on 01/20/22  HM MAMMOGRAPHY  Result Value Ref Range   HM Mammogram 0-4 Bi-Rad 0-4 Bi-Rad, Self Reported Normal  POCT glycosylated hemoglobin (Hb A1C)  Result Value Ref Range   Hemoglobin A1C 7.9 (A) 4.0 - 5.6 %   HbA1c POC (<> result, manual entry)     HbA1c, POC (prediabetic range)     HbA1c, POC (controlled diabetic range)        The 10-year ASCVD risk score (Arnett DK, et al., 2019) is: 20.8%    Assessment & Plan:   Problem List Items Addressed This Visit       Cardiovascular and Mediastinum   Essential hypertension, benign - Primary    Well controlled. Continue current regimen. Follow up in  6 mo        Relevant Medications   atorvastatin (LIPITOR) 20 MG tablet     Endocrine   Diabetes mellitus type 2, controlled, without complications (HCC)    A5W jumped significantly to 7.9.  She eventually discontinued the semaglutide because which is causing severe heartburn so has been off of it for several months.  Her last A1c was 5.9 back in the summer.  We discussed options including trying a different GLP-1 such as Rybelsus.  Or considering Wilder Glade because of her renal impairment issues.      Relevant Medications   atorvastatin (LIPITOR) 20 MG tablet   Other Relevant Orders   POCT glycosylated hemoglobin (Hb A1C) (Completed)     Genitourinary   Stage 3b chronic kidney disease ([place])    Has f/u with Nephrology next week. Consider Farxiga          Other   MGUS (monoclonal gammopathy of unknown significance) (Chronic)    Recently had updated labs with Oncology.  Stable.       Other Visit Diagnoses     Screening for malignant neoplasm of colon       Relevant Orders   Ambulatory  referral to Gastroenterology   Hemorrhoids, unspecified hemorrhoid type       Relevant Medications   atorvastatin (LIPITOR) 20 MG tablet   Other Relevant Orders   Ambulatory referral to General Surgery       Return in about 3 months (around 04/21/2022).    Beatrice Lecher, MD

## 2022-01-20 NOTE — Patient Instructions (Addendum)
Please speak with the nephrologist about starting Farxiga.  I think this would be a great choice to help lower your blood sugar and add renal protection (reduce risk  for progression of renal disease).  If the nephrologist feels comfortable with this please shoot me a message and I will send over prescription to the pharmacy.

## 2022-01-20 NOTE — Assessment & Plan Note (Signed)
A1c jumped significantly to 7.9.  She eventually discontinued the semaglutide because which is causing severe heartburn so has been off of it for several months.  Her last A1c was 5.9 back in the summer.  We discussed options including trying a different GLP-1 such as Rybelsus.  Or considering Wilder Glade because of her renal impairment issues.

## 2022-01-25 DIAGNOSIS — E1121 Type 2 diabetes mellitus with diabetic nephropathy: Secondary | ICD-10-CM | POA: Diagnosis not present

## 2022-01-25 DIAGNOSIS — I129 Hypertensive chronic kidney disease with stage 1 through stage 4 chronic kidney disease, or unspecified chronic kidney disease: Secondary | ICD-10-CM | POA: Diagnosis not present

## 2022-01-25 DIAGNOSIS — N1831 Chronic kidney disease, stage 3a: Secondary | ICD-10-CM | POA: Diagnosis not present

## 2022-01-25 DIAGNOSIS — E785 Hyperlipidemia, unspecified: Secondary | ICD-10-CM | POA: Diagnosis not present

## 2022-01-27 ENCOUNTER — Encounter: Payer: Self-pay | Admitting: Family Medicine

## 2022-01-27 DIAGNOSIS — Z1211 Encounter for screening for malignant neoplasm of colon: Secondary | ICD-10-CM

## 2022-01-27 DIAGNOSIS — Z13820 Encounter for screening for osteoporosis: Secondary | ICD-10-CM

## 2022-01-27 DIAGNOSIS — Z780 Asymptomatic menopausal state: Secondary | ICD-10-CM

## 2022-01-27 NOTE — Telephone Encounter (Signed)
I don't see these referrals placed yet. Pended GI referral and Bone density scan.

## 2022-01-28 NOTE — Telephone Encounter (Signed)
Orders Placed This Encounter  Procedures   DG Bone Density    Scheduling Instructions:     Schedule at [facility] in [facility].    Order Specific Question:   Reason for Exam (SYMPTOM  OR DIAGNOSIS REQUIRED)    Answer:   post menopausal, osteoporosis screen    Order Specific Question:   Preferred imaging location?    Answer:   External   Ambulatory referral to Gastroenterology    Referral Priority:   Routine    Referral Type:   Consultation    Referral Reason:   Specialty Services Required    Number of Visits Requested:   1

## 2022-02-04 DIAGNOSIS — H40013 Open angle with borderline findings, low risk, bilateral: Secondary | ICD-10-CM | POA: Diagnosis not present

## 2022-02-04 DIAGNOSIS — R7303 Prediabetes: Secondary | ICD-10-CM | POA: Diagnosis not present

## 2022-02-04 DIAGNOSIS — H2513 Age-related nuclear cataract, bilateral: Secondary | ICD-10-CM | POA: Diagnosis not present

## 2022-02-04 DIAGNOSIS — H00012 Hordeolum externum right lower eyelid: Secondary | ICD-10-CM | POA: Diagnosis not present

## 2022-02-04 LAB — HM DIABETES EYE EXAM

## 2022-02-10 DIAGNOSIS — K5900 Constipation, unspecified: Secondary | ICD-10-CM | POA: Diagnosis not present

## 2022-02-10 DIAGNOSIS — K649 Unspecified hemorrhoids: Secondary | ICD-10-CM | POA: Diagnosis not present

## 2022-02-24 DIAGNOSIS — Z13820 Encounter for screening for osteoporosis: Secondary | ICD-10-CM | POA: Diagnosis not present

## 2022-02-24 DIAGNOSIS — Z780 Asymptomatic menopausal state: Secondary | ICD-10-CM | POA: Diagnosis not present

## 2022-02-24 LAB — HM DEXA SCAN: HM Dexa Scan: NORMAL

## 2022-02-28 ENCOUNTER — Telehealth: Payer: Self-pay | Admitting: Family Medicine

## 2022-02-28 NOTE — Telephone Encounter (Signed)
Call pt: bone density is in the normal range. No sign of osteoporosis. Repeat in 5 years.

## 2022-03-01 NOTE — Telephone Encounter (Signed)
Patient informed of results.  

## 2022-03-09 DIAGNOSIS — E785 Hyperlipidemia, unspecified: Secondary | ICD-10-CM | POA: Diagnosis not present

## 2022-03-09 DIAGNOSIS — E1121 Type 2 diabetes mellitus with diabetic nephropathy: Secondary | ICD-10-CM | POA: Diagnosis not present

## 2022-03-09 DIAGNOSIS — I129 Hypertensive chronic kidney disease with stage 1 through stage 4 chronic kidney disease, or unspecified chronic kidney disease: Secondary | ICD-10-CM | POA: Diagnosis not present

## 2022-03-09 DIAGNOSIS — N1831 Chronic kidney disease, stage 3a: Secondary | ICD-10-CM | POA: Diagnosis not present

## 2022-03-11 ENCOUNTER — Encounter: Payer: Self-pay | Admitting: Family Medicine

## 2022-03-16 DIAGNOSIS — E042 Nontoxic multinodular goiter: Secondary | ICD-10-CM | POA: Diagnosis not present

## 2022-03-16 DIAGNOSIS — E782 Mixed hyperlipidemia: Secondary | ICD-10-CM | POA: Diagnosis not present

## 2022-03-16 DIAGNOSIS — N183 Chronic kidney disease, stage 3 unspecified: Secondary | ICD-10-CM | POA: Diagnosis not present

## 2022-03-16 DIAGNOSIS — E119 Type 2 diabetes mellitus without complications: Secondary | ICD-10-CM | POA: Diagnosis not present

## 2022-03-16 DIAGNOSIS — I10 Essential (primary) hypertension: Secondary | ICD-10-CM | POA: Diagnosis not present

## 2022-03-30 DIAGNOSIS — G4733 Obstructive sleep apnea (adult) (pediatric): Secondary | ICD-10-CM | POA: Diagnosis not present

## 2022-04-04 ENCOUNTER — Other Ambulatory Visit: Payer: Self-pay | Admitting: Cardiology

## 2022-04-12 DIAGNOSIS — E611 Iron deficiency: Secondary | ICD-10-CM | POA: Diagnosis not present

## 2022-04-12 DIAGNOSIS — D472 Monoclonal gammopathy: Secondary | ICD-10-CM | POA: Diagnosis not present

## 2022-04-14 DIAGNOSIS — D472 Monoclonal gammopathy: Secondary | ICD-10-CM | POA: Diagnosis not present

## 2022-04-15 NOTE — Progress Notes (Signed)
HPI: FU hypertension. Echocardiogram in October of 2012 showed normal LV function, grade 1 diastolic dysfunction, and mild left atrial enlargement. She states HCTZ caused "cramps".  Calcium score January 2023 was 0.  Since last seen, the patient has dyspnea with more extreme activities but not with routine activities. It is relieved with rest. It is not associated with chest pain. There is no orthopnea, PND or pedal edema. There is no syncope or palpitations. There is no exertional chest pain.   Current Outpatient Medications  Medication Sig Dispense Refill   Acetaminophen (TYLENOL EXTRA STRENGTH PO) Take 2 tablets by mouth as needed.     atorvastatin (LIPITOR) 20 MG tablet Take 1 tablet (20 mg total) by mouth at bedtime. 90 tablet 3   fexofenadine (ALLEGRA) 180 MG tablet Take 180 mg by mouth in the morning and at bedtime.     fluticasone (FLONASE) 50 MCG/ACT nasal spray Place into both nostrils as needed.      glucose blood test strip      losartan (COZAAR) 100 MG tablet TAKE 1 TABLET DAILY 90 tablet 3   Multiple Vitamin (MULTIVITAMIN) tablet Take 1 tablet by mouth daily.     Nebivolol HCl 20 MG TABS TAKE 1 TABLET DAILY 90 tablet 3   spironolactone (ALDACTONE) 25 MG tablet TAKE ONE-HALF (1/2) TABLET DAILY 45 tablet 3   triamcinolone cream (KENALOG) 0.1 % Apply 1 application topically 2 (two) times daily. (Patient taking differently: Apply 1 application  topically as needed.) 30 g 0   verapamil (CALAN-SR) 180 MG CR tablet TAKE ONE-HALF (1/2) TABLET DAILY 45 tablet 3   Vitamin D, Ergocalciferol, (DRISDOL) 1.25 MG (50000 UNIT) CAPS capsule Take 1 capsule (50,000 Units total) by mouth every 14 (fourteen) days. 6 capsule 3   No current facility-administered medications for this visit.     Past Medical History:  Diagnosis Date   Anemia    Asthma 11/30/2015   Bleeding hemorrhoids 06/05/2015   DDD (degenerative disc disease), cervical 11/30/2015   DDD (degenerative disc disease), lumbar  11/30/2015   Diabetes mellitus    Edema    LLE- podiatry in [place]   Endometriosis    Fibromyalgia 11/30/2015   Goiter    Hemorrhoids    Hyperlipidemia    Hypertension    Iron malabsorption 06/05/2015   Lumbosacral radiculopathy at S1 07/18/2014   Bilateral   MGUS (monoclonal gammopathy of unknown significance) 06/05/2015   Osteoarthritis of both hands 11/30/2015   Osteoarthritis of both knees 11/30/2015   Osteopenia    Osteoporosis 11/30/2015   Other iron deficiency anemias 06/05/2015   Pain, joint, hip, right    chronic- Dr Charlann Boxerlin/ Dr Ethelene Halamos   Prediabetes    Pseudogout 11/30/2015   Sleep apnea     Past Surgical History:  Procedure Laterality Date   Back Injections     ESOPHAGOGASTRODUODENOSCOPY  7-11   for anemia   FLEXIBLE SIGMOIDOSCOPY  01/31/2011   Procedure: FLEXIBLE SIGMOIDOSCOPY;  Surgeon: Barrie FolkJohn C Hayes, MD;  [facility];  Service: Endoscopy;  Laterality: N/A;   TOTAL ABDOMINAL HYSTERECTOMY  1995   bilat oophorectomy/ endometriosis   VULVAR LESION REMOVAL  09/07/2011   Procedure: VULVAR LESION;  Surgeon: Jeannette Corpusaniel L Clarke-Pearson, MD;  [facility];  Service: Gynecology;  Laterality: N/A;  vaginal lesion    Social History   Socioeconomic History   Marital status: Married    Spouse name: Not on file   Number of children: 2  Years of education: Not on file   Highest education level: Associate degree: occupational, Scientist, product/process developmenttechnical, or vocational program  Occupational History   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 Ambulatory, no  cane or walker. Works at [employer].    [employer].   Right handed.   Caffeine none.   Social Determinants of Health   Financial Resource Strain: Low Risk  (04/20/2022)   Overall Financial Resource Strain (CARDIA)    Difficulty of Paying Living Expenses: Not hard at all  Food Insecurity: No Food Insecurity (04/20/2022)   Hunger Vital Sign    Worried About Running Out of Food in the Last Year: Never true    Ran Out of Food in the Last Year: Never true  Transportation Needs: No Transportation Needs (04/20/2022)   PRAPARE - Administrator, Civil ServiceTransportation    Lack of Transportation (Medical): No    Lack of Transportation (Non-Medical): No  Physical Activity: Insufficiently Active (04/20/2022)   Exercise Vital Sign    Days of Exercise per Week: 3 days    Minutes of Exercise per Session: 40 min  Stress: No Stress Concern Present (04/20/2022)   [employer] - Occupational Stress Questionnaire    Feeling of Stress : Not at all  Social Connections: Socially Integrated (04/20/2022)   Social Connection and Isolation Panel [NHANES]    Frequency of Communication with Friends and Family: More than three times a week    Frequency of Social Gatherings with Friends and Family: Twice a week    Attends Religious Services: More than 4 times per year    Active Member of [employer]ubs or Organizations: Yes    Attends Engineer, structuralClub or Organization Meetings: More than 4 times per year    Marital Status: Married  Catering managerntimate Partner Violence: Not on file    Family History  Problem Relation Age of Onset   Rheum arthritis Mother    Hypertension Mother    Cancer Father        bladder   Heart attack Father        MI at age 64   Diabetes Brother    Diabetes Brother    Diabetes Brother    Diabetes Brother    Sarcoidosis Brother    Sarcoidosis Sister    Diabetes Sister    Polycystic ovary syndrome Daughter    Hypertension Daughter    Anesthesia problems Neg Hx    Hypotension Neg Hx     Malignant hyperthermia Neg Hx    Pseudochol deficiency Neg Hx     ROS: no fevers or chills, productive cough, hemoptysis, dysphasia, odynophagia, melena, hematochezia, dysuria, hematuria, rash, seizure activity, orthopnea, PND, pedal edema, claudication. Remaining systems are negative.  Physical Exam: Well-developed well-nourished in no acute distress.  Skin is warm and dry.  HEENT is normal.  Neck is supple.  Chest is clear to auscultation with normal expansion.  Cardiovascular exam is regular rate and rhythm.  Abdominal exam nontender or distended. No masses palpated. Extremities show  no edema. neuro grossly intact  ECG-normal sinus rhythm at a rate of 72, no significant ST changes.  Personally reviewed  A/P  1 hypertension-patient's blood pressure is controlled.  Continue present medical regimen and follow-up.  2 hyperlipidemia-continue Lipitor.  Recent calcium score 0.  3 obstructive sleep apnea-continue CPAP.  Olga Millers, MD

## 2022-04-19 DIAGNOSIS — D472 Monoclonal gammopathy: Secondary | ICD-10-CM | POA: Diagnosis not present

## 2022-04-19 DIAGNOSIS — E611 Iron deficiency: Secondary | ICD-10-CM | POA: Diagnosis not present

## 2022-04-21 ENCOUNTER — Ambulatory Visit: Payer: BC Managed Care – PPO | Admitting: Family Medicine

## 2022-04-21 ENCOUNTER — Encounter: Payer: Self-pay | Admitting: Family Medicine

## 2022-04-21 VITALS — BP 126/58 | HR 68 | Ht 66.0 in | Wt 220.0 lb

## 2022-04-21 DIAGNOSIS — Z7184 Encounter for health counseling related to travel: Secondary | ICD-10-CM | POA: Diagnosis not present

## 2022-04-21 DIAGNOSIS — E119 Type 2 diabetes mellitus without complications: Secondary | ICD-10-CM | POA: Diagnosis not present

## 2022-04-21 DIAGNOSIS — I10 Essential (primary) hypertension: Secondary | ICD-10-CM | POA: Diagnosis not present

## 2022-04-21 LAB — POCT GLYCOSYLATED HEMOGLOBIN (HGB A1C): HbA1c POC (<> result, manual entry): 7.1 % (ref 4.0–5.6)

## 2022-04-21 NOTE — Progress Notes (Signed)
Established Patient Office Visit  Subjective   Patient ID: Donna Dyer, female    DOB: November 21, 1960  Age: 62 y.o. MRN: [id number]  Chief Complaint  Patient presents with   Hypertension   Diabetes    HPI   Hypertension- Pt denies chest pain, SOB, dizziness, or heart palpitations.  Taking meds as directed w/o problems.  Denies medication side effects.    Diabetes - no hypoglycemic events. No wounds or sores that are not healing well. No increased thirst or urination. Checking glucose at home. Taking medications as prescribed without any side effects.  Colonoscopy is scheduled for next week.  Eye exam is up-to-date.  Has been getting some great blood sugar numbers recently.  MGUS has been stable and recently followed up with oncology.  No concerns right now.  She is can be leaving for a 14-day cruise to [place] soon    ROS    Objective:     BP (!) 126/58   Pulse 68   Ht 5\' 6"  (1.676 m)   Wt 220 lb (99.8 kg)   SpO2 100%   BMI 35.51 kg/m    Physical Exam Vitals and nursing note reviewed.  Constitutional:      Appearance: She is well-developed.  HENT:     Head: Normocephalic and atraumatic.     Right Ear: External ear normal.     Left Ear: External ear normal.     Nose: Nose normal.  Eyes:     Conjunctiva/sclera: Conjunctivae normal.     Pupils: Pupils are equal, round, and reactive to light.  Neck:     Thyroid: No thyromegaly.  Cardiovascular:     Rate and Rhythm: Normal rate and regular rhythm.     Heart sounds: Normal heart sounds.  Pulmonary:     Effort: Pulmonary effort is normal.     Breath sounds: Normal breath sounds. No wheezing.  Musculoskeletal:     Cervical back: Neck supple.  Lymphadenopathy:     Cervical: No cervical adenopathy.  Skin:    General: Skin is warm and dry.  Neurological:     Mental Status: She is alert and oriented to person, place, and time.  Psychiatric:        Behavior: Behavior normal.      Results for orders placed  or performed in visit on 04/21/22  POCT glycosylated hemoglobin (Hb A1C)  Result Value Ref Range   Hemoglobin A1C     HbA1c POC (<> result, manual entry) 7.1 4.0 - 5.6 %   HbA1c, POC (prediabetic range)     HbA1c, POC (controlled diabetic range)        The 10-year ASCVD risk score (Arnett DK, et al., 2019) is: 18.6%    Assessment & Plan:   Problem List Items Addressed This Visit       Cardiovascular and Mediastinum   Essential hypertension, benign - Primary    Well controlled. Continue current regimen. Follow up in  4 mo         Endocrine   Diabetes mellitus type 2, controlled, without complications    A1c looks fantastic today at 7.1.  It is trending down from 7.9.  She is been really doing a great job.  I recommend any changes today. F/u in 3 months.       Relevant Orders   POCT glycosylated hemoglobin (Hb A1C) (Completed)   Other Visit Diagnoses     Travel advice encounter  We did discuss using scopolamine if she would like to she can just reach out before her trip and we can always send in a prescription.  Or just stick with the Dramamine or the nondrowsy Dramamine over-the-counter.  Give her a copy of her medication list as well as her shot record to take with her for her trip.  Return in about 4 months (around 08/08/2022) for Diabetes follow-up.    [name], MD

## 2022-04-21 NOTE — Assessment & Plan Note (Signed)
A1c looks fantastic today at 7.1.  It is trending down from 7.9.  She is been really doing a great job.  I recommend any changes today. F/u in 3 months.

## 2022-04-21 NOTE — Assessment & Plan Note (Signed)
Well controlled. Continue current regimen. Follow up in  4 mo 

## 2022-04-21 NOTE — Patient Instructions (Signed)
Patient Active Problem List   Diagnosis Date Noted   Stage 3b chronic kidney disease 09/20/2021   Metatarsalgia of both feet 05/01/2017   Other acquired hammer toe 05/01/2017   Toe pain, bilateral 05/01/2017   Nontoxic single thyroid nodule 03/13/2016   DDD (degenerative disc disease), cervical 11/30/2015   DDD (degenerative disc disease), lumbar 11/30/2015   Osteoarthritis of both hands 11/30/2015   Osteoarthritis of both knees 11/30/2015   Osteoporosis 11/30/2015   Anemia 11/30/2015   Asthma 11/30/2015   Bilateral primary osteoarthritis of knee 11/26/2015   Vitamin D deficiency 10/13/2015   Food intolerance 07/03/2015   MGUS (monoclonal gammopathy of unknown significance) 06/05/2015   Other iron deficiency anemias 06/05/2015   Iron malabsorption 06/05/2015   Other seasonal allergic rhinitis 11/29/2013   Venous stasis 11/29/2013   Cramping of feet 09/10/2013   Hyperlipidemia associated with type 2 diabetes mellitus 07/19/2012   Hypersomnia with sleep apnea 07/19/2012   Severe obesity (BMI 35.0-39.9) with comorbidity 07/19/2012   Thoracic or lumbosacral neuritis or radiculitis 06/16/2012   Right L4-L5 Lumbosacral facet joint syndrome 02/16/2012   Hypertriglyceridemia 09/26/2011   Vaginal intraepithelial neoplasia III (VAIN III) 08/23/2011   Borderline glaucoma, open angle with borderline findings 03/30/2011   OSA (obstructive sleep apnea) 03/29/2011   Multinodular goiter 02/28/2011   Internal hemorrhoids 01/31/2011   Diabetes mellitus type 2, controlled, without complications 08/23/2010   Essential hypertension, benign 02/23/2010   Edema 09/23/2009

## 2022-04-25 ENCOUNTER — Ambulatory Visit: Payer: BC Managed Care – PPO | Admitting: Cardiology

## 2022-04-25 ENCOUNTER — Encounter: Payer: Self-pay | Admitting: Cardiology

## 2022-04-25 VITALS — BP 128/72 | HR 72 | Ht 66.0 in | Wt 221.1 lb

## 2022-04-25 DIAGNOSIS — E7800 Pure hypercholesterolemia, unspecified: Secondary | ICD-10-CM

## 2022-04-25 DIAGNOSIS — G4733 Obstructive sleep apnea (adult) (pediatric): Secondary | ICD-10-CM | POA: Diagnosis not present

## 2022-04-25 DIAGNOSIS — E119 Type 2 diabetes mellitus without complications: Secondary | ICD-10-CM | POA: Diagnosis not present

## 2022-04-25 DIAGNOSIS — I10 Essential (primary) hypertension: Secondary | ICD-10-CM | POA: Diagnosis not present

## 2022-04-25 MED ORDER — NEBIVOLOL HCL 20 MG PO TABS: 1.0000 | ORAL_TABLET | Freq: Every day | ORAL | 3 refills | Status: DC

## 2022-04-25 MED ORDER — VERAPAMIL HCL ER 180 MG PO TBCR: 90.0000 mg | EXTENDED_RELEASE_TABLET | Freq: Every day | ORAL | 3 refills | Status: DC

## 2022-04-25 MED ORDER — SPIRONOLACTONE 25 MG PO TABS: 12.5000 mg | ORAL_TABLET | Freq: Every day | ORAL | 3 refills | Status: DC

## 2022-04-25 MED ORDER — LOSARTAN POTASSIUM 100 MG PO TABS: 100.0000 mg | ORAL_TABLET | Freq: Every day | ORAL | 3 refills | Status: DC

## 2022-04-25 MED ORDER — ATORVASTATIN CALCIUM 20 MG PO TABS: 20.0000 mg | ORAL_TABLET | Freq: Every day | ORAL | 3 refills | Status: DC

## 2022-04-25 NOTE — Patient Instructions (Signed)
  Follow-Up: At [facility], you and your health needs are our priority.  As part of our continuing mission to provide you with exceptional heart care, we have created designated Provider Care Teams.  These Care Teams include your primary Cardiologist (physician) and Advanced Practice Providers (APPs -  Physician Assistants and Nurse Practitioners) who all work together to provide you with the care you need, when you need it.  We recommend signing up for the patient portal called "MyChart".  Sign up information is provided on this After Visit Summary.  MyChart is used to connect with patients for Virtual Visits (Telemedicine).  Patients are able to view lab/test results, encounter notes, upcoming appointments, etc.  Non-urgent messages can be sent to your provider as well.   To learn more about what you can do with MyChart, go to [email or web address].    Your next appointment:   12 month(s)  Provider:   Brian Crenshaw MD    

## 2022-04-25 NOTE — Addendum Note (Signed)
Addended by: Debbe Bales on: 04/25/2022 08:34 AM   Modules accepted: Orders

## 2022-04-27 DIAGNOSIS — G4730 Sleep apnea, unspecified: Secondary | ICD-10-CM | POA: Diagnosis not present

## 2022-04-27 DIAGNOSIS — E7800 Pure hypercholesterolemia, unspecified: Secondary | ICD-10-CM | POA: Diagnosis not present

## 2022-04-27 DIAGNOSIS — K5900 Constipation, unspecified: Secondary | ICD-10-CM | POA: Diagnosis not present

## 2022-04-27 DIAGNOSIS — K648 Other hemorrhoids: Secondary | ICD-10-CM | POA: Diagnosis not present

## 2022-04-27 DIAGNOSIS — K921 Melena: Secondary | ICD-10-CM | POA: Diagnosis not present

## 2022-04-27 LAB — HM COLONOSCOPY

## 2022-04-28 DIAGNOSIS — E041 Nontoxic single thyroid nodule: Secondary | ICD-10-CM | POA: Diagnosis not present

## 2022-04-29 ENCOUNTER — Encounter: Payer: Self-pay | Admitting: Family Medicine

## 2022-04-30 DIAGNOSIS — G4733 Obstructive sleep apnea (adult) (pediatric): Secondary | ICD-10-CM | POA: Diagnosis not present

## 2022-05-05 DIAGNOSIS — L819 Disorder of pigmentation, unspecified: Secondary | ICD-10-CM | POA: Diagnosis not present

## 2022-05-30 DIAGNOSIS — G4733 Obstructive sleep apnea (adult) (pediatric): Secondary | ICD-10-CM | POA: Diagnosis not present

## 2022-06-08 DIAGNOSIS — J309 Allergic rhinitis, unspecified: Secondary | ICD-10-CM | POA: Diagnosis not present

## 2022-06-08 DIAGNOSIS — H903 Sensorineural hearing loss, bilateral: Secondary | ICD-10-CM | POA: Diagnosis not present

## 2022-06-14 DIAGNOSIS — Z1272 Encounter for screening for malignant neoplasm of vagina: Secondary | ICD-10-CM | POA: Diagnosis not present

## 2022-06-14 DIAGNOSIS — Z01419 Encounter for gynecological examination (general) (routine) without abnormal findings: Secondary | ICD-10-CM | POA: Diagnosis not present

## 2022-06-14 DIAGNOSIS — Z6836 Body mass index (BMI) 36.0-36.9, adult: Secondary | ICD-10-CM | POA: Diagnosis not present

## 2022-06-28 ENCOUNTER — Encounter: Payer: Self-pay | Admitting: Family Medicine

## 2022-06-30 DIAGNOSIS — K641 Second degree hemorrhoids: Secondary | ICD-10-CM | POA: Diagnosis not present

## 2022-06-30 DIAGNOSIS — K625 Hemorrhage of anus and rectum: Secondary | ICD-10-CM | POA: Diagnosis not present

## 2022-07-12 DIAGNOSIS — I129 Hypertensive chronic kidney disease with stage 1 through stage 4 chronic kidney disease, or unspecified chronic kidney disease: Secondary | ICD-10-CM | POA: Diagnosis not present

## 2022-07-12 DIAGNOSIS — N1831 Chronic kidney disease, stage 3a: Secondary | ICD-10-CM | POA: Diagnosis not present

## 2022-07-12 DIAGNOSIS — E785 Hyperlipidemia, unspecified: Secondary | ICD-10-CM | POA: Diagnosis not present

## 2022-07-12 DIAGNOSIS — E1121 Type 2 diabetes mellitus with diabetic nephropathy: Secondary | ICD-10-CM | POA: Diagnosis not present

## 2022-07-21 DIAGNOSIS — I10 Essential (primary) hypertension: Secondary | ICD-10-CM | POA: Diagnosis not present

## 2022-07-21 DIAGNOSIS — N183 Chronic kidney disease, stage 3 unspecified: Secondary | ICD-10-CM | POA: Diagnosis not present

## 2022-07-21 DIAGNOSIS — E559 Vitamin D deficiency, unspecified: Secondary | ICD-10-CM | POA: Diagnosis not present

## 2022-07-21 DIAGNOSIS — E782 Mixed hyperlipidemia: Secondary | ICD-10-CM | POA: Diagnosis not present

## 2022-07-21 DIAGNOSIS — E119 Type 2 diabetes mellitus without complications: Secondary | ICD-10-CM | POA: Diagnosis not present

## 2022-07-21 DIAGNOSIS — E042 Nontoxic multinodular goiter: Secondary | ICD-10-CM | POA: Diagnosis not present

## 2022-08-01 DIAGNOSIS — E041 Nontoxic single thyroid nodule: Secondary | ICD-10-CM | POA: Diagnosis not present

## 2022-08-04 DIAGNOSIS — D472 Monoclonal gammopathy: Secondary | ICD-10-CM | POA: Diagnosis not present

## 2022-08-08 ENCOUNTER — Encounter: Payer: Self-pay | Admitting: Family Medicine

## 2022-08-08 ENCOUNTER — Ambulatory Visit: Payer: BC Managed Care – PPO | Admitting: Family Medicine

## 2022-08-08 VITALS — BP 132/78 | HR 58 | Ht 66.0 in | Wt 220.0 lb

## 2022-08-08 DIAGNOSIS — I10 Essential (primary) hypertension: Secondary | ICD-10-CM

## 2022-08-08 DIAGNOSIS — E1169 Type 2 diabetes mellitus with other specified complication: Secondary | ICD-10-CM

## 2022-08-08 DIAGNOSIS — N1832 Chronic kidney disease, stage 3b: Secondary | ICD-10-CM | POA: Diagnosis not present

## 2022-08-08 DIAGNOSIS — E119 Type 2 diabetes mellitus without complications: Secondary | ICD-10-CM | POA: Diagnosis not present

## 2022-08-08 DIAGNOSIS — E785 Hyperlipidemia, unspecified: Secondary | ICD-10-CM

## 2022-08-08 MED ORDER — ROSUVASTATIN CALCIUM 10 MG PO TABS
10.0000 mg | ORAL_TABLET | ORAL | 1 refills | Status: DC
Start: 2022-08-08 — End: 2023-04-04

## 2022-08-08 NOTE — Assessment & Plan Note (Signed)
Has not started her statin she has been very hesitant.  She said in the past she tried atorvastatin and it caused myalgias.  So we discussed starting Crestor and just starting with taking it twice a week and will start with a lower dose of 10 mg.  New prescription sent to pharmacy.  If she tolerates well after couple of months we can try going up to 3 days/week.

## 2022-08-08 NOTE — Assessment & Plan Note (Addendum)
Need to monitor renal function every 6 months we discussed the importance of being on an ARB to prevent Further progression of kidney disease.  Also discussed the importance of keeping blood pressure optimized as well as blood sugars.  He is seeing an integrative nephrologist out of [place].  On the labs that she brought in today the serum creatinine was 1.25 on July 21, 2022.  Lab Results  Component Value Date   CREATININE 1.33 (H) 09/30/2021   CREATININE 1.28 (H) 01/29/2020   CREATININE 1.2 (A) 09/05/2018

## 2022-08-08 NOTE — Assessment & Plan Note (Addendum)
Blood pressure still not optimal, but close.  Still running in the 130s primarily.  120s and occasionally 1 to low 140s.  She is off the verapamil intake now and taking old arginine.  She has been tracking her blood pressures at home.  We did discuss that 1 option would be to change the losartan to [place] which is a little bit more powerful.  We just need to recheck creatinine if we did make a change.  And we have to make sure insurance would cover it.

## 2022-08-08 NOTE — Progress Notes (Unsigned)
Established Patient Office Visit  Subjective   Patient ID: Donna Dyer, female    DOB: July 15, 1960  Age: 62 y.o. MRN: [id number]  Chief Complaint  Patient presents with   Diabetes   Hypertension    HPI - 6 mo f/u.  Hypertension- Pt denies chest pain, SOB, dizziness, or heart palpitations.  Taking meds as directed w/o problems.  Denies medication side effects.  Since I last saw her nephrology took her off of her verapamil because it was dropping her heart rate too low.  Did bring in home blood pressure log.  Diabetes - no hypoglycemic events. No wounds or sores that are not healing well. No increased thirst or urination. Checking glucose at home. Taking medications as prescribed without any side effects.  {History (Optional):23778}  ROS    Objective:     BP 132/78   Pulse (!) 58   Ht 5\' 6"  (1.676 m)   Wt 220 lb (99.8 kg)   SpO2 100%   BMI 35.51 kg/m  {Vitals History (Optional):23777}  Physical Exam Vitals and nursing note reviewed.  Constitutional:      Appearance: She is well-developed.  HENT:     Head: Normocephalic and atraumatic.  Cardiovascular:     Rate and Rhythm: Normal rate and regular rhythm.     Heart sounds: Normal heart sounds.  Pulmonary:     Effort: Pulmonary effort is normal.     Breath sounds: Normal breath sounds.  Skin:    General: Skin is warm and dry.  Neurological:     Mental Status: She is alert and oriented to person, place, and time.  Psychiatric:        Behavior: Behavior normal.      No results found for any visits on 08/08/22.  {Labs (Optional):23779}  The 10-year ASCVD risk score (Arnett DK, et al., 2019) is: 21.7%    Assessment & Plan:   Problem List Items Addressed This Visit       Cardiovascular and Mediastinum   Essential hypertension, benign - Primary    Blood pressure still not optimal, but close.  Still running in the 130s primarily.  120s and occasionally 1 to low 140s.  She is off the verapamil intake now  and taking old arginine.  She has been tracking her blood pressures at home.  We did discuss that 1 option would be to change the losartan to [place] which is a little bit more powerful.  We just need to recheck creatinine if we did make a change.  And we have to make sure insurance would cover it.      Relevant Medications   rosuvastatin (CRESTOR) 10 MG tablet   Other Relevant Orders   POCT glycosylated hemoglobin (Hb A1C)     Endocrine   Hyperlipidemia associated with type 2 diabetes mellitus (HCC)    Has not started her statin she has been very hesitant.  She said in the past she tried atorvastatin and it caused myalgias.  So we discussed starting Crestor and just starting with taking it twice a week and will start with a lower dose of 10 mg.  New prescription sent to pharmacy.  If she tolerates well after couple of months we can try going up to 3 days/week.      Relevant Medications   rosuvastatin (CRESTOR) 10 MG tablet   Diabetes mellitus type 2, controlled, without complications (HCC)   Relevant Medications   rosuvastatin (CRESTOR) 10 MG tablet   Other Relevant Orders  POCT glycosylated hemoglobin (Hb A1C)     Genitourinary   Stage 3b chronic kidney disease (HCC)    Need to monitor renal function every 6 months we discussed the importance of being on an ARB to prevent Further progression of kidney disease.  Also discussed the importance of keeping blood pressure optimized as well as blood sugars.  He is seeing an integrative nephrologist out of [place].  On the labs that she brought in today the serum creatinine was 1.25 on July 21, 2022.  Lab Results  Component Value Date   CREATININE 1.33 (H) 09/30/2021   CREATININE 1.28 (H) 01/29/2020   CREATININE 1.2 (A) 09/05/2018         Relevant Orders   POCT glycosylated hemoglobin (Hb A1C)    Return in about 6 months (around 02/08/2023) for Hypertension, Diabetes follow-up.    [name], MD

## 2022-08-09 NOTE — Assessment & Plan Note (Signed)
Continue to work on [employer] and staying active.  Weight has been stable.

## 2022-08-09 NOTE — Assessment & Plan Note (Signed)
Looks great on recent labs.

## 2022-08-11 DIAGNOSIS — D472 Monoclonal gammopathy: Secondary | ICD-10-CM | POA: Diagnosis not present

## 2022-08-17 DIAGNOSIS — I129 Hypertensive chronic kidney disease with stage 1 through stage 4 chronic kidney disease, or unspecified chronic kidney disease: Secondary | ICD-10-CM | POA: Diagnosis not present

## 2022-08-17 DIAGNOSIS — E785 Hyperlipidemia, unspecified: Secondary | ICD-10-CM | POA: Diagnosis not present

## 2022-08-17 DIAGNOSIS — N1831 Chronic kidney disease, stage 3a: Secondary | ICD-10-CM | POA: Diagnosis not present

## 2022-08-17 DIAGNOSIS — E1121 Type 2 diabetes mellitus with diabetic nephropathy: Secondary | ICD-10-CM | POA: Diagnosis not present

## 2022-08-31 ENCOUNTER — Telehealth: Payer: Self-pay | Admitting: Family Medicine

## 2022-08-31 DIAGNOSIS — N1832 Chronic kidney disease, stage 3b: Secondary | ICD-10-CM

## 2022-08-31 DIAGNOSIS — E119 Type 2 diabetes mellitus without complications: Secondary | ICD-10-CM

## 2022-08-31 DIAGNOSIS — I10 Essential (primary) hypertension: Secondary | ICD-10-CM

## 2022-08-31 NOTE — Telephone Encounter (Signed)
Please call pt and ler her know we needs updated labs and urine. I check and heme/onc hasn't checked these specific labs.  Orders Placed This Encounter  Procedures   CMP14+EGFR   Lipid panel   Alb/Creat Ratio, Timed Ur

## 2022-08-31 NOTE — Telephone Encounter (Signed)
LVM advising pt of lab orders.

## 2022-09-16 ENCOUNTER — Telehealth: Payer: Self-pay | Admitting: Family Medicine

## 2022-09-16 NOTE — Telephone Encounter (Signed)
Patient called I about lab orders, states that she already had a lipid panel done in July with [facility] and brought copies of her lab/results with her during last visit. States that her insurance will not cover another lipid panel. Please advise.

## 2022-09-20 ENCOUNTER — Other Ambulatory Visit: Payer: Self-pay | Admitting: *Deleted

## 2022-09-20 NOTE — Addendum Note (Signed)
Addended by: Deno Etienne on: 09/20/2022 10:33 AM   Modules accepted: Orders

## 2022-09-21 DIAGNOSIS — E119 Type 2 diabetes mellitus without complications: Secondary | ICD-10-CM | POA: Diagnosis not present

## 2022-09-21 DIAGNOSIS — I10 Essential (primary) hypertension: Secondary | ICD-10-CM | POA: Diagnosis not present

## 2022-09-21 DIAGNOSIS — H00012 Hordeolum externum right lower eyelid: Secondary | ICD-10-CM | POA: Diagnosis not present

## 2022-09-21 DIAGNOSIS — N1832 Chronic kidney disease, stage 3b: Secondary | ICD-10-CM | POA: Diagnosis not present

## 2022-09-21 DIAGNOSIS — K641 Second degree hemorrhoids: Secondary | ICD-10-CM | POA: Diagnosis not present

## 2022-09-21 DIAGNOSIS — K625 Hemorrhage of anus and rectum: Secondary | ICD-10-CM | POA: Diagnosis not present

## 2022-09-21 DIAGNOSIS — K5900 Constipation, unspecified: Secondary | ICD-10-CM | POA: Diagnosis not present

## 2022-09-21 NOTE — Telephone Encounter (Signed)
Pt's labs will be abstracted

## 2022-09-23 LAB — ALB/CREAT RATIO, TIMED UR
Albumin,Ur mg/day: <9.1 mg/d (ref <30.0)
Creatinine, Urine: 54.4 mg/dL
Microa. Excret. Rate: <6.3 ug/min (ref 0.0–20.0)
Microalb/Crt. Ratio: <5.5 ug/mg{creat} (ref 0.0–30.0)
Microalbumin, Urine: <3 ug/mL
Ur.Collec. Interval: 1440 min

## 2022-09-23 LAB — CMP14+EGFR
ALT: 12 IU/L (ref 0–32)
AST: 16 IU/L (ref 0–40)
Albumin: 4.3 g/dL (ref 3.9–4.9)
Alkaline Phosphatase: 128 IU/L — ABNORMAL HIGH (ref 44–121)
BUN/Creatinine Ratio: 18 (ref 12–28)
BUN: 24 mg/dL (ref 8–27)
Bilirubin Total: 0.6 mg/dL (ref 0.0–1.2)
CO2: 24 mmol/L (ref 20–29)
Calcium: 9.4 mg/dL (ref 8.7–10.3)
Chloride: 101 mmol/L (ref 96–106)
Creatinine, Ser: 1.37 mg/dL — ABNORMAL HIGH (ref 0.57–1.00)
Globulin, Total: 3.7 g/dL (ref 1.5–4.5)
Glucose: 95 mg/dL (ref 70–99)
Potassium: 5.1 mmol/L (ref 3.5–5.2)
Sodium: 138 mmol/L (ref 134–144)
Total Protein: 8 g/dL (ref 6.0–8.5)
eGFR: 43 mL/min/{1.73_m2} — ABNORMAL LOW (ref >59)

## 2022-09-26 NOTE — Progress Notes (Signed)
Hi Donna Dyer, kidney function stable at 1.3-year usually between 1.2 and 1.3 doing to keep a close eye on this and make sure that we recheck it again in 4 to 6 months.  Your AST and ALT liver enzymes were normal but the alkaline phosphatase was up just a little at 128 not in a concerning range but again I do want to follow that closely and make sure that we recheck it again in about 3 months.  Urine mouth microalbumin is negative.  So no excess protein in the urine which is great.

## 2022-10-12 DIAGNOSIS — K641 Second degree hemorrhoids: Secondary | ICD-10-CM | POA: Diagnosis not present

## 2022-10-27 ENCOUNTER — Ambulatory Visit (INDEPENDENT_AMBULATORY_CARE_PROVIDER_SITE_OTHER): Payer: BC Managed Care – PPO

## 2022-10-27 DIAGNOSIS — Z23 Encounter for immunization: Secondary | ICD-10-CM

## 2022-11-09 DIAGNOSIS — E1121 Type 2 diabetes mellitus with diabetic nephropathy: Secondary | ICD-10-CM | POA: Diagnosis not present

## 2022-11-09 DIAGNOSIS — E785 Hyperlipidemia, unspecified: Secondary | ICD-10-CM | POA: Diagnosis not present

## 2022-11-09 DIAGNOSIS — I129 Hypertensive chronic kidney disease with stage 1 through stage 4 chronic kidney disease, or unspecified chronic kidney disease: Secondary | ICD-10-CM | POA: Diagnosis not present

## 2022-11-09 DIAGNOSIS — N1831 Chronic kidney disease, stage 3a: Secondary | ICD-10-CM | POA: Diagnosis not present

## 2022-11-21 DIAGNOSIS — E782 Mixed hyperlipidemia: Secondary | ICD-10-CM | POA: Diagnosis not present

## 2022-11-21 DIAGNOSIS — M810 Age-related osteoporosis without current pathological fracture: Secondary | ICD-10-CM | POA: Diagnosis not present

## 2022-11-21 DIAGNOSIS — E119 Type 2 diabetes mellitus without complications: Secondary | ICD-10-CM | POA: Diagnosis not present

## 2022-11-21 DIAGNOSIS — E042 Nontoxic multinodular goiter: Secondary | ICD-10-CM | POA: Diagnosis not present

## 2022-11-21 DIAGNOSIS — D509 Iron deficiency anemia, unspecified: Secondary | ICD-10-CM | POA: Diagnosis not present

## 2022-11-21 DIAGNOSIS — E559 Vitamin D deficiency, unspecified: Secondary | ICD-10-CM | POA: Diagnosis not present

## 2022-12-06 LAB — HEMOGLOBIN A1C: Hemoglobin A1C: 6.7

## 2022-12-07 DIAGNOSIS — G4733 Obstructive sleep apnea (adult) (pediatric): Secondary | ICD-10-CM | POA: Diagnosis not present

## 2022-12-15 DIAGNOSIS — D472 Monoclonal gammopathy: Secondary | ICD-10-CM | POA: Diagnosis not present

## 2022-12-29 DIAGNOSIS — Z1231 Encounter for screening mammogram for malignant neoplasm of breast: Secondary | ICD-10-CM | POA: Diagnosis not present

## 2022-12-29 LAB — HM MAMMOGRAPHY

## 2023-01-05 ENCOUNTER — Encounter: Payer: Self-pay | Admitting: Family Medicine

## 2023-01-06 DIAGNOSIS — G4733 Obstructive sleep apnea (adult) (pediatric): Secondary | ICD-10-CM | POA: Diagnosis not present

## 2023-01-17 DIAGNOSIS — N1831 Chronic kidney disease, stage 3a: Secondary | ICD-10-CM | POA: Diagnosis not present

## 2023-01-18 LAB — LAB REPORT - SCANNED
Creatinine, POC: 95 mg/dL
EGFR: 42

## 2023-01-24 DIAGNOSIS — D472 Monoclonal gammopathy: Secondary | ICD-10-CM | POA: Diagnosis not present

## 2023-01-31 DIAGNOSIS — I129 Hypertensive chronic kidney disease with stage 1 through stage 4 chronic kidney disease, or unspecified chronic kidney disease: Secondary | ICD-10-CM | POA: Diagnosis not present

## 2023-01-31 DIAGNOSIS — N1831 Chronic kidney disease, stage 3a: Secondary | ICD-10-CM | POA: Diagnosis not present

## 2023-01-31 DIAGNOSIS — M79672 Pain in left foot: Secondary | ICD-10-CM | POA: Diagnosis not present

## 2023-01-31 DIAGNOSIS — E1121 Type 2 diabetes mellitus with diabetic nephropathy: Secondary | ICD-10-CM | POA: Diagnosis not present

## 2023-02-06 DIAGNOSIS — G4733 Obstructive sleep apnea (adult) (pediatric): Secondary | ICD-10-CM | POA: Diagnosis not present

## 2023-02-08 ENCOUNTER — Encounter: Payer: Self-pay | Admitting: Family Medicine

## 2023-02-08 ENCOUNTER — Ambulatory Visit: Payer: BC Managed Care – PPO | Admitting: Family Medicine

## 2023-02-08 VITALS — BP 128/78 | HR 71 | Ht 66.0 in | Wt 226.0 lb

## 2023-02-08 DIAGNOSIS — E211 Secondary hyperparathyroidism, not elsewhere classified: Secondary | ICD-10-CM | POA: Diagnosis not present

## 2023-02-08 DIAGNOSIS — E119 Type 2 diabetes mellitus without complications: Secondary | ICD-10-CM | POA: Diagnosis not present

## 2023-02-08 DIAGNOSIS — I10 Essential (primary) hypertension: Secondary | ICD-10-CM | POA: Diagnosis not present

## 2023-02-08 DIAGNOSIS — E559 Vitamin D deficiency, unspecified: Secondary | ICD-10-CM

## 2023-02-08 DIAGNOSIS — E781 Pure hyperglyceridemia: Secondary | ICD-10-CM | POA: Diagnosis not present

## 2023-02-08 DIAGNOSIS — N1831 Chronic kidney disease, stage 3a: Secondary | ICD-10-CM | POA: Diagnosis not present

## 2023-02-08 DIAGNOSIS — M79672 Pain in left foot: Secondary | ICD-10-CM | POA: Diagnosis not present

## 2023-02-08 DIAGNOSIS — Z23 Encounter for immunization: Secondary | ICD-10-CM | POA: Diagnosis not present

## 2023-02-08 NOTE — Assessment & Plan Note (Signed)
Will get labs today.

## 2023-02-08 NOTE — Assessment & Plan Note (Signed)
We discussed starting the statin expectations.  We can always make adjustments on the dosing or frequency of dosing or even try switching medications if needed.  She wants to recheck her lipids first to see if there is any improvement with her current dietary changes but she did pick up and fill the prescription.

## 2023-02-08 NOTE — Progress Notes (Signed)
   Established Patient Office Visit  Subjective  Patient ID: Donna Dyer, female    DOB: October 07, 1960  Age: 63 y.o. MRN: [id number]  Chief Complaint  Patient presents with   Hypertension   Diabetes    HPI  She is getting a panel of labs done through the nephrologist today.  They are doing some additional workup on her iron deficiency vitamin D deficiency.  She does take a vitamin D supplement.  There can also check parathyroid hormone levels and repeat a renal panel.  She is also currently on losartan 100 mg  Diabetes-last A1c was 6.8 November 11.  She is doing really well she is actually currently just managing with diet.  She said she never started the Crestor.  She has really tried to change her diet by eating more oatmeal and flaxseed etc.  Has been a little hesitant to start the statin because of your myalgias.    ROS    Objective:     BP 128/78   Pulse 71   Ht 5\' 6"  (1.676 m)   Wt 226 lb (102.5 kg)   SpO2 100%   BMI 36.48 kg/m    Physical Exam Vitals and nursing note reviewed.  Constitutional:      Appearance: Normal appearance.  HENT:     Head: Normocephalic and atraumatic.  Eyes:     Conjunctiva/sclera: Conjunctivae normal.  Cardiovascular:     Rate and Rhythm: Normal rate and regular rhythm.  Pulmonary:     Effort: Pulmonary effort is normal.     Breath sounds: Normal breath sounds.  Skin:    General: Skin is warm and dry.  Neurological:     Mental Status: She is alert.  Psychiatric:        Mood and Affect: Mood normal.      Results for orders placed or performed in visit on 02/08/23  Hemoglobin A1c  Result Value Ref Range   Hemoglobin A1C 6.7       The 10-year ASCVD risk score (Arnett DK, et al., 2019) is: 20.2%    Assessment & Plan:   Problem List Items Addressed This Visit       Cardiovascular and Mediastinum   Essential hypertension, benign - Primary   Well controlled. Continue current regimen. Follow up in  6 mo          Endocrine   Diabetes mellitus type 2, controlled, without complications (HCC)     Other   Vitamin D deficiency   Will get labs today.       Hypertriglyceridemia   We discussed starting the statin expectations.  We can always make adjustments on the dosing or frequency of dosing or even try switching medications if needed.  She wants to recheck her lipids first to see if there is any improvement with her current dietary changes but she did pick up and fill the prescription.      Relevant Orders   Lipid panel     Ok to given Prevnar 20 when she returns.    Return in about 4 months (around 06/08/2023) for Diabetes follow-up, Hypertension.    [name], MD

## 2023-02-08 NOTE — Assessment & Plan Note (Signed)
Well controlled. Continue current regimen. Follow up in  6 mo

## 2023-02-09 DIAGNOSIS — H2513 Age-related nuclear cataract, bilateral: Secondary | ICD-10-CM | POA: Diagnosis not present

## 2023-02-09 DIAGNOSIS — H40013 Open angle with borderline findings, low risk, bilateral: Secondary | ICD-10-CM | POA: Diagnosis not present

## 2023-02-09 DIAGNOSIS — H04123 Dry eye syndrome of bilateral lacrimal glands: Secondary | ICD-10-CM | POA: Diagnosis not present

## 2023-02-09 LAB — LIPID PANEL
Cholesterol: 243 mg/dL — ABNORMAL HIGH (ref <200)
HDL: 48 mg/dL — ABNORMAL LOW (ref >=50)
Non-HDL Cholesterol (Calc): 195 mg/dL — ABNORMAL HIGH (ref <130)
Total CHOL/HDL Ratio: 5.1 (calc) — ABNORMAL HIGH (ref <5.0)
Triglycerides: 438 mg/dL — ABNORMAL HIGH (ref <150)

## 2023-02-10 ENCOUNTER — Encounter: Payer: Self-pay | Admitting: Family Medicine

## 2023-02-10 DIAGNOSIS — Z23 Encounter for immunization: Secondary | ICD-10-CM

## 2023-02-10 NOTE — Progress Notes (Signed)
Please call lab and see if we can add a direct LDL since the triglycerides were greater than 400 it could not calculate them.

## 2023-02-27 IMAGING — CT CT CARDIAC CORONARY ARTERY CALCIUM SCORE
3 series · 14 of 20 positions shown, 16 images · non-contrast
Comparison: None.
COMPARISON: None.

Addendum:
EXAM:
OVER-READ INTERPRETATION  CT CHEST

The following report is an over-read performed by radiologist Dr.
Kaki Jim [REDACTED] on 02/04/2021. This
over-read does not include interpretation of cardiac or coronary
anatomy or pathology. The coronary calcium score interpretation by
the cardiologist is attached.
CLINICAL DATA: Cardiovascular Disease Risk stratification
Coronary Calcium Score
TECHNIQUE: A gated, non-contrast computed tomography scan of the heart was
performed using 3mm slice thickness. Axial images were analyzed on a
dedicated workstation. Calcium scoring of the coronary arteries was
performed using the Agatston method.

[Series 2: cascseq 3.0 sa36 (id) (id) · axial · 0.34mm/px · z∈[+1222,+1303]mm · 4 of 45 slices shown]
[im 9/45  vessel]
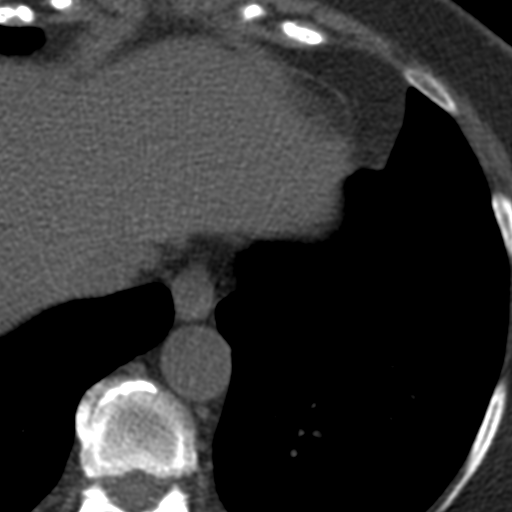
[im 18/45  vessel]
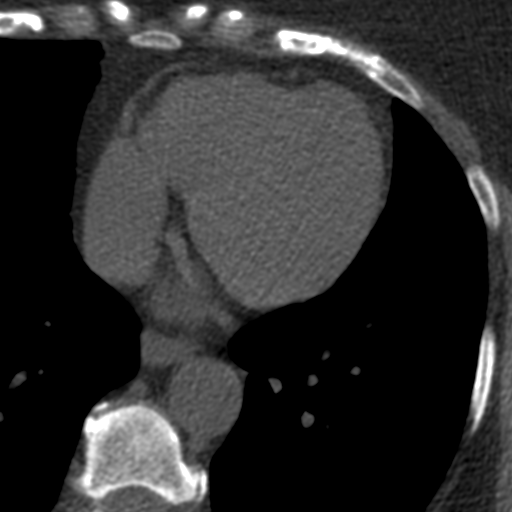
[im 27/45  vessel]
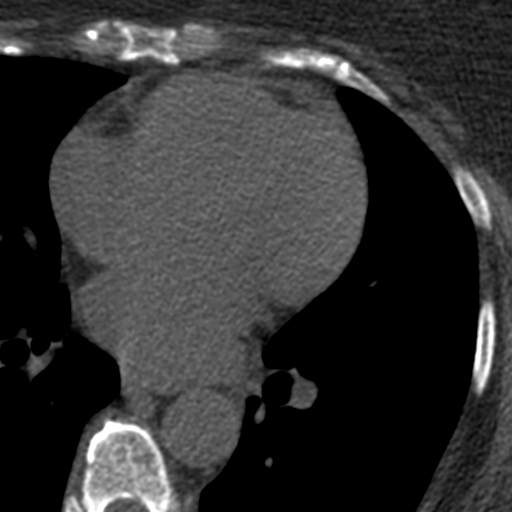
[im 36/45  vessel]
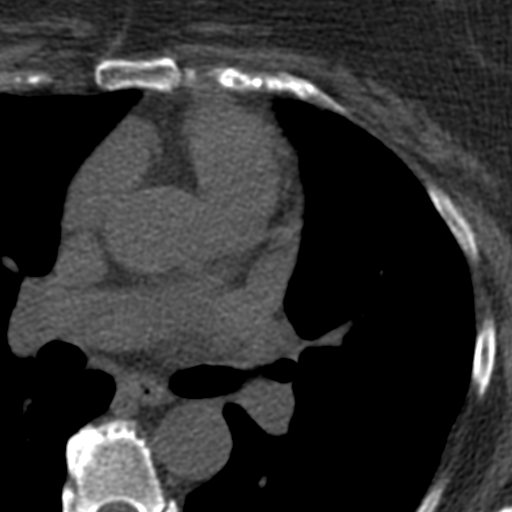

[Series 3: full fov st (id) · axial · 0.67mm/px · z∈[+1219,+1306]mm · 5 of 45 slices shown, 7 images]
[im 8/45  vessel]
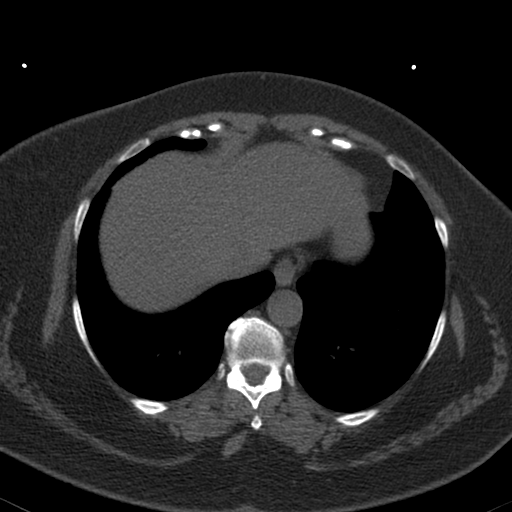
[im 8/45  lung]
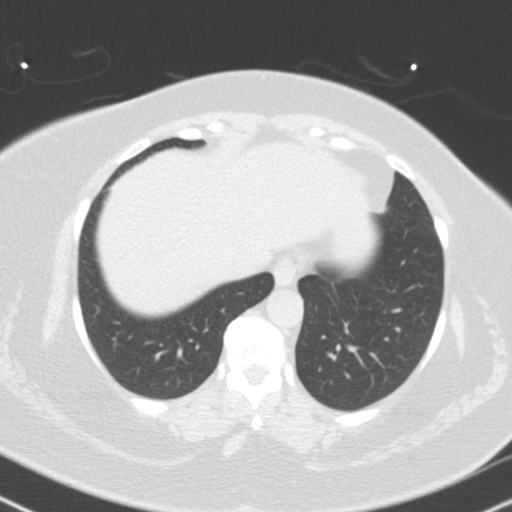
[im 15/45  vessel]
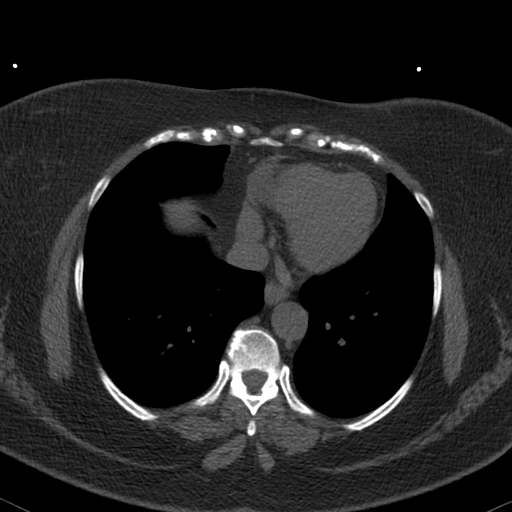
[im 23/45  vessel]
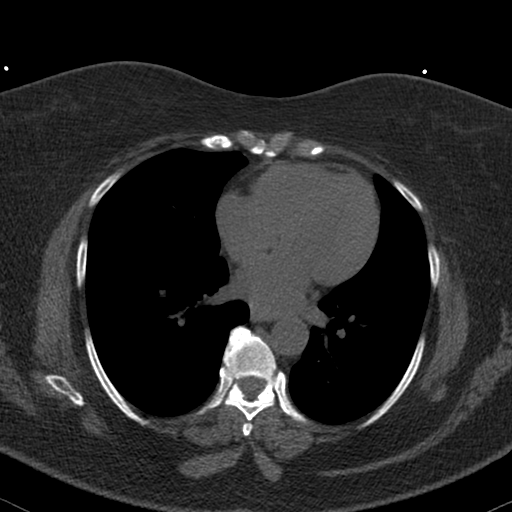
[im 30/45  vessel]
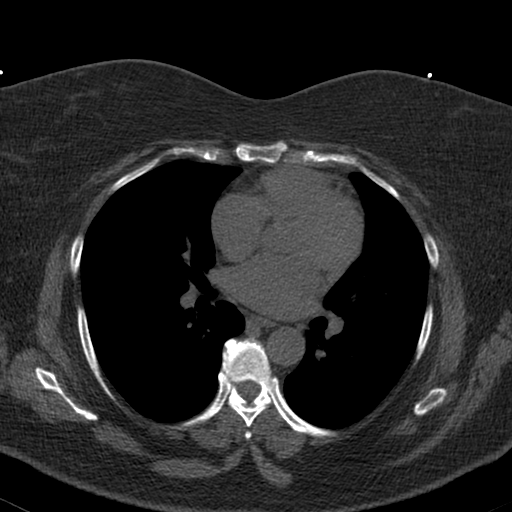
[im 37/45  vessel]
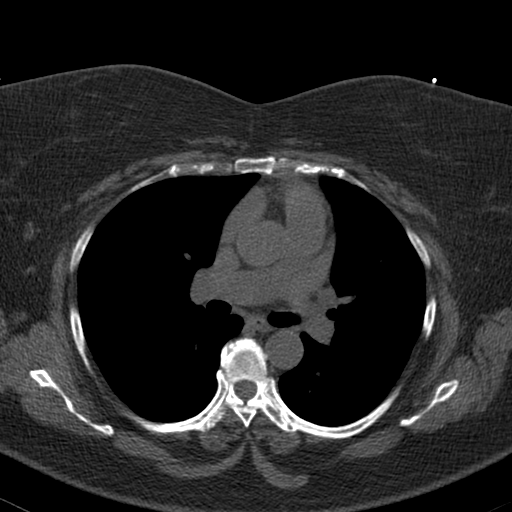
[im 37/45  lung]
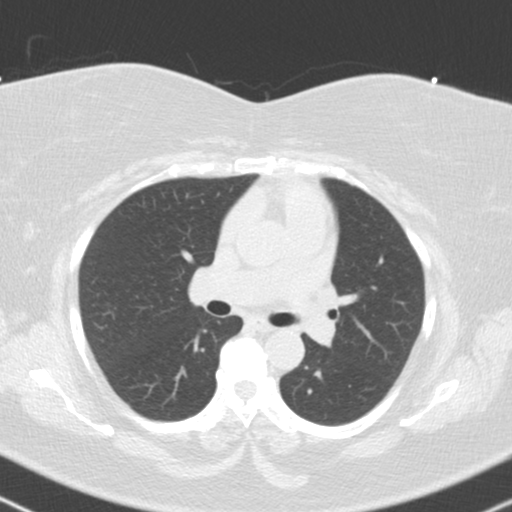

[Series 4: full fov lung · axial · 0.67mm/px · z∈[+1219,+1306]mm · 5 of 45 slices shown]
[im 8/45  lung]
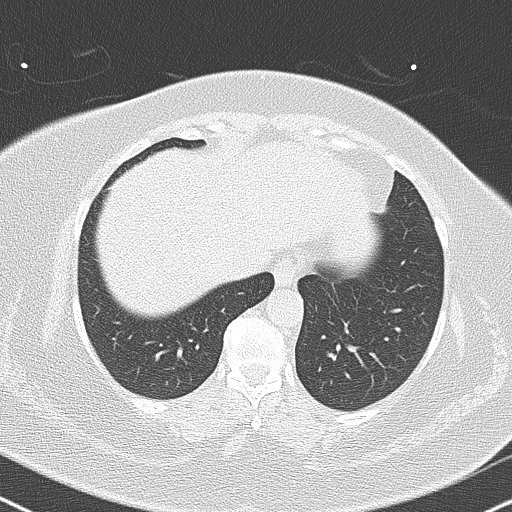
[im 15/45  lung]
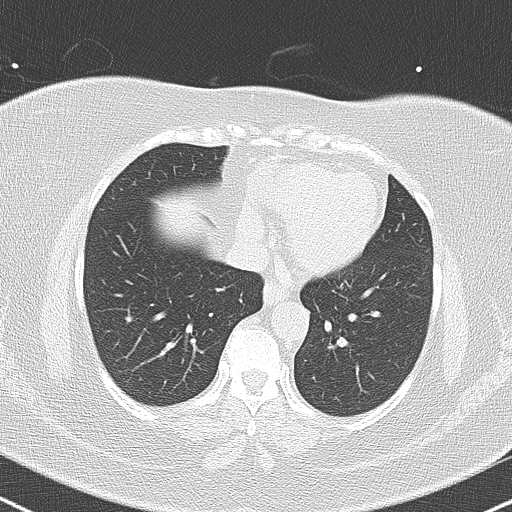
[im 23/45  lung]
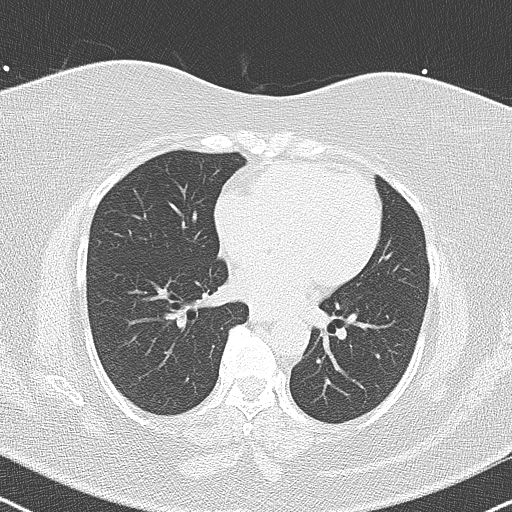
[im 30/45  lung]
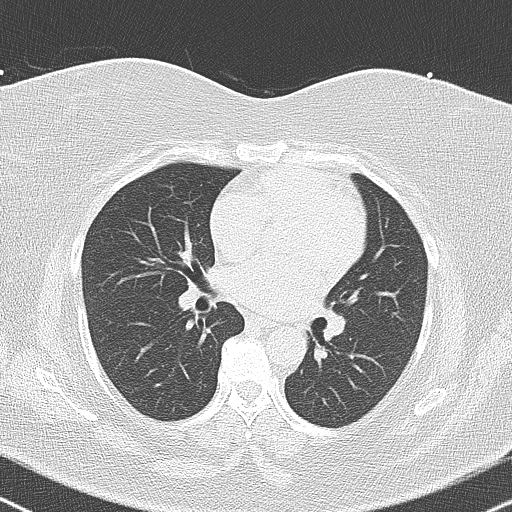
[im 37/45  lung]
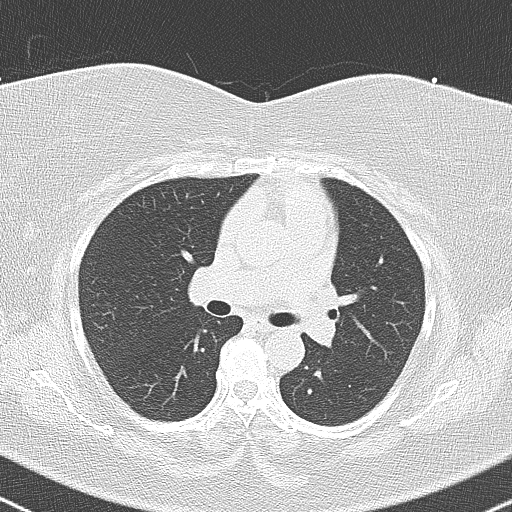

[14 of 20 positions shown; findings below may reference images not displayed]

FINDINGS: Atherosclerotic calcifications in the thoracic aorta. Within the
visualized portions of the thorax there are no suspicious appearing
pulmonary nodules or masses, there is no acute consolidative
airspace disease, no pleural effusions, no pneumothorax and no
lymphadenopathy. Visualized portions of the upper abdomen
demonstrates mild diffuse low attenuation throughout the visualized
hepatic parenchyma. There are no aggressive appearing lytic or
blastic lesions noted in the visualized portions of the skeleton.
IMPRESSION: 1.  Aortic Atherosclerosis (MRT9Z-EJ6.6).
2. Hepatic steatosis.
FINDINGS: Coronary arteries: Normal origins.

Coronary Calcium Score:

Left main: 0

Left anterior descending artery: 0

Left circumflex artery: 0

Right coronary artery: 0

Total: 0

Percentile: 0

Pericardium: Normal.

Aorta: Normal caliber of ascending aorta. Aortic atherosclerosis
noted.

Non-cardiac: See separate report from [REDACTED].
IMPRESSION: Coronary calcium score of 0. This was 0 percentile for age-, race-,
and sex-matched controls. Aortic atherosclerosis noted.



If CAC=0, it is reasonable to withhold statin therapy and reassess
in 5 to 10 years, as long as higher risk conditions are absent
(diabetes mellitus, family history of premature CHD in first degree
relatives (males <55 years; females <65 years), cigarette smoking,
or LDL >=190 mg/dL).

If CAC is 1 to 99, it is reasonable to initiate statin therapy for
patients >=55 years of age.

If CAC is >=100 or >=75th percentile, it is reasonable to initiate
statin therapy at any age.

Cardiology referral should be considered for patients with CAC
scores >=400 or >=75th percentile.

*3308 AHA/ACC/AACVPR/AAPA/ABC/GRAGG/POLIN/BLAIN/Funez/TORREALBA/THACH/LUSTIG
Guideline on the Management of Blood Cholesterol: A Report of the
American College of Cardiology/American Heart Association Task Force
on Clinical Practice Guidelines. J Am Coll Cardiol.
1862;73(24):1828-1870.

*** End of Addendum ***
EXAM:
OVER-READ INTERPRETATION  CT CHEST

The following report is an over-read performed by radiologist Dr.
Kaki Jim [REDACTED] on 02/04/2021. This
over-read does not include interpretation of cardiac or coronary
anatomy or pathology. The coronary calcium score interpretation by
the cardiologist is attached.
FINDINGS: Atherosclerotic calcifications in the thoracic aorta. Within the
visualized portions of the thorax there are no suspicious appearing
pulmonary nodules or masses, there is no acute consolidative
airspace disease, no pleural effusions, no pneumothorax and no
lymphadenopathy. Visualized portions of the upper abdomen
demonstrates mild diffuse low attenuation throughout the visualized
hepatic parenchyma. There are no aggressive appearing lytic or
blastic lesions noted in the visualized portions of the skeleton.
IMPRESSION: 1.  Aortic Atherosclerosis (MRT9Z-EJ6.6).
2. Hepatic steatosis.

## 2023-03-09 DIAGNOSIS — K5900 Constipation, unspecified: Secondary | ICD-10-CM | POA: Diagnosis not present

## 2023-03-09 DIAGNOSIS — K641 Second degree hemorrhoids: Secondary | ICD-10-CM | POA: Diagnosis not present

## 2023-03-09 DIAGNOSIS — K625 Hemorrhage of anus and rectum: Secondary | ICD-10-CM | POA: Diagnosis not present

## 2023-03-21 DIAGNOSIS — I10 Essential (primary) hypertension: Secondary | ICD-10-CM | POA: Diagnosis not present

## 2023-03-21 DIAGNOSIS — E042 Nontoxic multinodular goiter: Secondary | ICD-10-CM | POA: Diagnosis not present

## 2023-03-21 DIAGNOSIS — E782 Mixed hyperlipidemia: Secondary | ICD-10-CM | POA: Diagnosis not present

## 2023-03-21 DIAGNOSIS — E119 Type 2 diabetes mellitus without complications: Secondary | ICD-10-CM | POA: Diagnosis not present

## 2023-03-28 ENCOUNTER — Other Ambulatory Visit: Payer: Self-pay | Admitting: Cardiology

## 2023-03-31 ENCOUNTER — Encounter: Payer: Self-pay | Admitting: Family Medicine

## 2023-03-31 DIAGNOSIS — I10 Essential (primary) hypertension: Secondary | ICD-10-CM

## 2023-03-31 DIAGNOSIS — E119 Type 2 diabetes mellitus without complications: Secondary | ICD-10-CM

## 2023-04-04 MED ORDER — NEBIVOLOL HCL 20 MG PO TABS: 1.0000 | ORAL_TABLET | Freq: Every day | ORAL | 0 refills | Status: DC

## 2023-04-04 MED ORDER — PITAVASTATIN CALCIUM 1 MG PO TABS: 1.0000 mg | ORAL_TABLET | ORAL | 1 refills | Status: DC

## 2023-04-04 MED ORDER — SPIRONOLACTONE 25 MG PO TABS: 12.5000 mg | ORAL_TABLET | Freq: Every day | ORAL | 0 refills | Status: DC

## 2023-04-04 MED ORDER — LOSARTAN POTASSIUM 100 MG PO TABS: 100.0000 mg | ORAL_TABLET | Freq: Every day | ORAL | 0 refills | Status: DC

## 2023-04-04 MED ORDER — VERAPAMIL HCL ER 180 MG PO TBCR: 90.0000 mg | EXTENDED_RELEASE_TABLET | Freq: Every day | ORAL | 0 refills | Status: DC

## 2023-04-04 MED ORDER — VERAPAMIL HCL ER 180 MG PO TBCR: 90.0000 mg | EXTENDED_RELEASE_TABLET | Freq: Every day | ORAL | 3 refills | Status: DC

## 2023-04-04 NOTE — Telephone Encounter (Signed)
 Cancelled the four scripts at [facility] - please see message from patient as below

## 2023-04-04 NOTE — Telephone Encounter (Signed)
 I apologize, please call [facility] and cancel the prescription for the verapamil spironolactone and nebivolol and losartan.  She needed them sent to [facility] I apologize.

## 2023-04-25 DIAGNOSIS — G4733 Obstructive sleep apnea (adult) (pediatric): Secondary | ICD-10-CM | POA: Diagnosis not present

## 2023-04-25 DIAGNOSIS — Z9989 Dependence on other enabling machines and devices: Secondary | ICD-10-CM | POA: Diagnosis not present

## 2023-05-01 ENCOUNTER — Encounter: Payer: Self-pay | Admitting: Emergency Medicine

## 2023-05-01 ENCOUNTER — Ambulatory Visit: Attending: Emergency Medicine | Admitting: Emergency Medicine

## 2023-05-01 ENCOUNTER — Other Ambulatory Visit: Payer: Self-pay | Admitting: Family Medicine

## 2023-05-01 VITALS — BP 120/66 | HR 66 | Ht 66.0 in | Wt 222.0 lb

## 2023-05-01 DIAGNOSIS — N1832 Chronic kidney disease, stage 3b: Secondary | ICD-10-CM

## 2023-05-01 DIAGNOSIS — E782 Mixed hyperlipidemia: Secondary | ICD-10-CM | POA: Diagnosis not present

## 2023-05-01 DIAGNOSIS — G4733 Obstructive sleep apnea (adult) (pediatric): Secondary | ICD-10-CM | POA: Diagnosis not present

## 2023-05-01 DIAGNOSIS — I10 Essential (primary) hypertension: Secondary | ICD-10-CM | POA: Diagnosis not present

## 2023-05-01 NOTE — Patient Instructions (Signed)
 Medication Instructions:  No changes *If you need a refill on your cardiac medications before your next appointment, please call your pharmacy*  Lab Work: No labs  Testing/Procedures: No testing  Follow-Up: At [facility], you and your health needs are our priority.  As part of our continuing mission to provide you with exceptional heart care, our providers are all part of one team.  This team includes your primary Cardiologist (physician) and Advanced Practice Providers or APPs (Physician Assistants and Nurse Practitioners) who all work together to provide you with the care you need, when you need it.  Your next appointment:   1 year(s)  Provider:   Alexandria Angel, MD     We recommend signing up for the patient portal called "MyChart".  Sign up information is provided on this After Visit Summary.  MyChart is used to connect with patients for Virtual Visits (Telemedicine).  Patients are able to view lab/test results, encounter notes, upcoming appointments, etc.  Non-urgent messages can be sent to your provider as well.   To learn more about what you can do with MyChart, go to [email or web address].   Other Instructions:   1st Floor: - Lobby - Registration  - Pharmacy  - Lab - Cafe  2nd Floor: - PV Lab - Diagnostic Testing (echo, CT, nuclear med)  3rd Floor: - Vacant  4th Floor: - TCTS (cardiothoracic surgery) - AFib Clinic - Structural Heart Clinic - Vascular Surgery  - Vascular Ultrasound  5th Floor: - HeartCare Cardiology (general and EP) - Clinical Pharmacy for coumadin, hypertension, lipid, weight-loss medications, and med management appointments    Valet parking services will be available as well.

## 2023-05-01 NOTE — Progress Notes (Unsigned)
 Cardiology Office Note:    Date:  05/01/2023  ID:  Donna, Dyer 02-09-1960, MRN [id number] PCP: Cydney Draft, MD  [facility] Providers Cardiologist:  Alexandria Angel, MD { Click to update primary MD,subspecialty MD or APP then REFRESH:1}    {Click to Open Review  :1}   Patient Profile:      Chief Complaint: 1 year follow-up for hypertension History of Present Illness:  Donna Dyer is a 63 y.o. female with visit-pertinent history of hypertension, hyperlipidemia, obstructive sleep apnea on CPAP, CKD stage IIIa, MGUS  Patient established with cardiology service in 10/2010 for evaluation of hypertension.  Echocardiogram in October 2012 showed normal LV function, grade 1 diastolic dysfunction, mild left atrial enlargement.  Calcium  score in January 2023 was 0.  Last seen in clinic on 04/25/2022 by Dr. Audery Blazing.  Patient's blood pressure was well-controlled.  Her current medication regimen was continued and she was to follow-up in 1 year.   Discussed the use of AI scribe software for clinical note transcription with the patient, who gave verbal consent to proceed.  History of Present Illness Donna Dyer presents to clinic today for a 1 year follow-up.  She is without acute cardiovascular concerns or complaints today.    The patient's hypertension is well-controlled with losartan , nebivolol , spironolactone , and verapamil , with a blood pressure reading today of 120/66. The patient's cholesterol has been high, but she has been trying to manage it with supplements and lifestyle changes after experiencing muscle aches and discomfort from statin therapy. The patient has not been using her CPAP machine for sleep apnea for several months but plans to start using it again.  She denies chest pain, shortness of breath, lower extremity edema, fatigue, palpitations, melena, hematuria, hemoptysis, diaphoresis, weakness, presyncope, syncope, orthopnea, and PND.    Review of  systems:  Please see the history of present illness. All other systems are reviewed and otherwise negative.     Home Medications:    Current Meds  Medication Sig   Acetaminophen  (TYLENOL  EXTRA STRENGTH PO) Take 2 tablets by mouth as needed.   AMBULATORY NON FORMULARY MEDICATION Take 1 each by mouth daily. Medication Name: beet root gummy   fexofenadine  (ALLEGRA ) 180 MG tablet Take 180 mg by mouth daily.   fluticasone  (FLONASE ) 50 MCG/ACT nasal spray Place into both nostrils as needed.    glucose blood test strip    losartan  (COZAAR ) 100 MG tablet Take 1 tablet (100 mg total) by mouth daily.   Multiple Vitamin (MULTIVITAMIN) tablet Take 1 tablet by mouth daily.   Nebivolol  HCl 20 MG TABS Take 1 tablet (20 mg total) by mouth daily.   spironolactone  (ALDACTONE ) 25 MG tablet Take 0.5 tablets (12.5 mg total) by mouth daily.   triamcinolone  cream (KENALOG ) 0.1 % Apply 1 application topically 2 (two) times daily. (Patient taking differently: Apply 1 application  topically as needed.)   verapamil  (CALAN -SR) 180 MG CR tablet Take 0.5 tablets (90 mg total) by mouth daily.   Vitamin D , Ergocalciferol , (DRISDOL ) 1.25 MG (50000 UNIT) CAPS capsule Take 1 capsule (50,000 Units total) by mouth every 14 (fourteen) days.   Studies Reviewed:   EKG Interpretation Date/Time:  Monday May 01 2023 08:27:41 EDT Ventricular Rate:  66 PR Interval:  170 QRS Duration:  86 QT Interval:  408 QTC Calculation: 427 R Axis:   35  Text Interpretation: Normal sinus rhythm Normal ECG No previous ECGs available Confirmed by Palmer Bobo [phone]) on 05/01/2023 8:29:27 AM  CT cardiac scoring 02/04/2021 Coronary calcium  score of 0. This was 0 percentile for age-, race-, and sex-matched controls. Aortic atherosclerosis noted. Risk Assessment/Calculations:             Physical Exam:   VS:  BP 120/66   Pulse 66   Ht 5\' 6"  (1.676 m)   Wt 222 lb (100.7 kg)   SpO2 98%   BMI 35.83 kg/m    Wt Readings from Last  3 Encounters:  05/01/23 222 lb (100.7 kg)  02/08/23 226 lb (102.5 kg)  08/08/22 220 lb (99.8 kg)    GEN: Well nourished, well developed in no acute distress NECK: No JVD; No carotid bruits CARDIAC: RRR, no murmurs, rubs, gallops RESPIRATORY:  Clear to auscultation without rales, wheezing or rhonchi  ABDOMEN: Soft, non-tender, non-distended EXTREMITIES:  No edema; No acute deformity     Assessment and Plan:  Hypertension Blood pressure today is 120/66 and under excellent control - Continue losartan  100 mg daily, Nebivolol  20 mg daily, spironolactone  12.5 mg daily, verapamil  90 mg daily - Continue monitoring BP at home and work on heart healthy dieting and physical exercise  Hyperlipidemia Calcium  score in January 2023 was 0 LDL 92, HDL 42, TC 185, TG 305 on 02/23/863 Patient previously intolerant to statins and currently being managed by patient's primary care provider Recently prescribed Pitavastatin  by PCP however patient has not started this yet as she has been working on lifestyle management and trialing supplements  Obstructive sleep apnea Currently not adherent to daily CPAP use - Encouraged CPAP compliance and discuss effects of uncontrolled OSA on cardiovascular health  CKD stage IIIb GFR 42 on 01/18/2023 - Managed by an integrative nephrologist out of [place]      Dispo:  Return in about 1 year (around 04/30/2024).  Signed, Ava Boatman, NP

## 2023-05-02 ENCOUNTER — Encounter: Payer: Self-pay | Admitting: Emergency Medicine

## 2023-05-16 ENCOUNTER — Ambulatory Visit
Admission: EM | Admit: 2023-05-16 | Discharge: 2023-05-16 | Disposition: A | Discharge status: Home / Self Care | Attending: Family Medicine | Admitting: Family Medicine

## 2023-05-16 ENCOUNTER — Other Ambulatory Visit: Payer: Self-pay

## 2023-05-16 DIAGNOSIS — S29012A Strain of muscle and tendon of back wall of thorax, initial encounter: Secondary | ICD-10-CM

## 2023-05-16 HISTORY — DX: Nontoxic multinodular goiter: E04.2

## 2023-05-16 MED ORDER — METAXALONE 800 MG PO TABS: 800.0000 mg | ORAL_TABLET | Freq: Three times a day (TID) | ORAL | 0 refills | Status: DC

## 2023-05-16 NOTE — ED Triage Notes (Signed)
 Upper back pain in left scapula area since last night. Has taken gas pills last night. Woke up still with pain.

## 2023-05-16 NOTE — Discharge Instructions (Signed)
 May take over-the-counter medicines for pain.  Tylenol , ibuprofen, or naproxen may help Use ice or heat to the area I have prescribed Skelaxin to take if needed.  This is a mild muscle relaxer To your doctor if not better by next week

## 2023-05-16 NOTE — ED Provider Notes (Signed)
 [facility]    CSN: [id number] Arrival date & time: 05/16/23  4098      History   Chief Complaint No chief complaint on file.   HPI Donna Dyer is a 63 y.o. female.   Patient states that she was going on her computer at home yesterday and gradually developed some pain in her left shoulder blade region.  She thought it might be gas and took a gas pill but this did not help.  When she got up this morning the pain was worse.  She is here for evaluation.  No accident or injury.  No strain of the muscles or heavy lifting.  She does note certain arm movements hurt.  It also hurts with a deep breath.  She has never had this pain before Patient is well-controlled diabetes hypertension hyperlipidemia.  Has not taken medicine for pain    Past Medical History:  Diagnosis Date   Anemia    Asthma 11/30/2015   Bleeding hemorrhoids 06/05/2015   DDD (degenerative disc disease), cervical 11/30/2015   DDD (degenerative disc disease), lumbar 11/30/2015   Diabetes mellitus    Edema    LLE- podiatry in [place]   Endometriosis    Fibromyalgia 11/30/2015   Goiter    Hemorrhoids    Hyperlipidemia    Hypertension    Iron malabsorption 06/05/2015   Lumbosacral radiculopathy at S1 07/18/2014   Bilateral   MGUS (monoclonal gammopathy of unknown significance) 06/05/2015   Nontoxic multinodular goiter    Osteoarthritis of both hands 11/30/2015   Osteoarthritis of both knees 11/30/2015   Osteopenia    Osteoporosis 11/30/2015   Other iron deficiency anemias 06/05/2015   Pain, joint, hip, right    chronic- Dr Bernard Brick Dr Rexanne Catalina   Prediabetes    Pseudogout 11/30/2015   Sleep apnea     Patient Active Problem List   Diagnosis Date Noted   Stage 3b chronic kidney disease (HCC) 09/20/2021   Metatarsalgia of both feet 05/01/2017   Other acquired hammer toe 05/01/2017   Pain in toes of both feet 05/01/2017   Nontoxic single thyroid  nodule 03/13/2016   DDD (degenerative disc disease),  cervical 11/30/2015   DDD (degenerative disc disease), lumbar 11/30/2015   Osteoarthritis of both hands 11/30/2015   Osteoarthritis of both knees 11/30/2015   Osteoporosis 11/30/2015   Anemia 11/30/2015   Asthma 11/30/2015   Bilateral primary osteoarthritis of knee 11/26/2015   Vitamin D  deficiency 10/13/2015   Food intolerance 07/03/2015   MGUS (monoclonal gammopathy of unknown significance) 06/05/2015   Other iron deficiency anemias 06/05/2015   Iron malabsorption 06/05/2015   Other seasonal allergic rhinitis 11/29/2013   Venous stasis 11/29/2013   Cramping of feet 09/10/2013   Hyperlipidemia associated with type 2 diabetes mellitus (HCC) 07/19/2012   Hypersomnia with sleep apnea 07/19/2012   Severe obesity (BMI 35.0-39.9) with comorbidity (HCC) 07/19/2012   Thoracic or lumbosacral neuritis or radiculitis 06/16/2012   Right L4-L5 Lumbosacral facet joint syndrome 02/16/2012   Hypertriglyceridemia 09/26/2011   Vaginal intraepithelial neoplasia III (VAIN III) 08/23/2011   Borderline glaucoma, open angle with borderline findings 03/30/2011   OSA (obstructive sleep apnea) 03/29/2011   Multinodular goiter 02/28/2011   Internal hemorrhoids 01/31/2011   Diabetes mellitus type 2, controlled, without complications (HCC) 08/23/2010   Essential hypertension, benign 02/23/2010   Edema 09/23/2009    Past Surgical History:  Procedure Laterality Date   Back Injections     ESOPHAGOGASTRODUODENOSCOPY  7-11   for anemia  FLEXIBLE SIGMOIDOSCOPY  01/31/2011   Procedure: FLEXIBLE SIGMOIDOSCOPY;  Surgeon: Barbie Boon, MD;  Location[facility] ENDOSCOPY;  Service: Endoscopy;  Laterality: N/A;   TOTAL ABDOMINAL HYSTERECTOMY  1995   bilat oophorectomy/ endometriosis   VULVAR LESION REMOVAL  09/07/2011   Procedure: VULVAR LESION;  Surgeon: Verdia Glad, MD;  Location: [facility];  Service: Gynecology;  Laterality: N/A;  vaginal lesion    OB History   No obstetric history on  file.      Home Medications    Prior to Admission medications   Medication Sig Start Date End Date Taking? Authorizing Provider  metaxalone (SKELAXIN) 800 MG tablet Take 1 tablet (800 mg total) by mouth 3 (three) times daily. 05/16/23  Yes Stephany Ehrich, MD  Acetaminophen  (TYLENOL  EXTRA STRENGTH PO) Take 2 tablets by mouth as needed.    [provider]  AMBULATORY NON FORMULARY MEDICATION Take 1 each by mouth daily. Medication Name: beet root gummy    [provider]  fexofenadine  (ALLEGRA ) 180 MG tablet Take 180 mg by mouth daily.    [provider]  fluticasone  (FLONASE ) 50 MCG/ACT nasal spray Place into both nostrils as needed.     [provider]  glucose blood test strip  12/29/15   [provider]  losartan  (COZAAR ) 100 MG tablet Take 1 tablet (100 mg total) by mouth daily. 04/04/23   Cydney Draft, MD  Multiple Vitamin (MULTIVITAMIN) tablet Take 1 tablet by mouth daily.    [provider]  Nebivolol  HCl 20 MG TABS Take 1 tablet (20 mg total) by mouth daily. 04/04/23   Cydney Draft, MD  Pitavastatin  Calcium  1 MG TABS TAKE 1 TABLET (1 MG TOTAL) BY MOUTH ONCE A WEEK 05/01/23   Cydney Draft, MD  spironolactone  (ALDACTONE ) 25 MG tablet Take 0.5 tablets (12.5 mg total) by mouth daily. 04/04/23   Cydney Draft, MD  triamcinolone  cream (KENALOG ) 0.1 % Apply 1 application topically 2 (two) times daily. Patient taking differently: Apply 1 application  topically as needed. 10/01/17   Daved Eriksson, PA-C  verapamil  (CALAN -SR) 180 MG CR tablet Take 0.5 tablets (90 mg total) by mouth daily. 04/04/23   Cydney Draft, MD  Vitamin D , Ergocalciferol , (DRISDOL ) 1.25 MG (50000 UNIT) CAPS capsule Take 1 capsule (50,000 Units total) by mouth every 14 (fourteen) days. 08/24/21   Cydney Draft, MD    Family History Family History  Problem Relation Age of Onset   Rheum arthritis Mother    Hypertension  Mother    Cancer Father        bladder   Heart attack Father        MI at age 49   Diabetes Brother    Diabetes Brother    Diabetes Brother    Diabetes Brother    Sarcoidosis Brother    Sarcoidosis Sister    Diabetes Sister    Polycystic ovary syndrome Daughter    Hypertension Daughter    Anesthesia problems Neg Hx    Hypotension Neg Hx    Malignant hyperthermia Neg Hx    Pseudochol deficiency Neg Hx     Social History Social History   Tobacco Use   Smoking status: Never   Smokeless tobacco: Never  Vaping Use   Vaping status: Never Used  Substance Use Topics   Alcohol use: No    Alcohol/week: 0.0 standard drinks of alcohol    Comment: Doesn't drink    Drug  use: No     Allergies   Pineapple, Sulfamethoxazole, Atorvastatin , Avapro [irbesartan], Crestor  [rosuvastatin ], Onglyza [saxagliptin], Amlodipine besylate, Amlodipine besylate, Asparagus, Azithromycin, Chlorthalidone , Codeine, Dulaglutide , Hydrochlorothiazide , and Metformin    Review of Systems Review of Systems See HPI  Physical Exam Triage Vital Signs ED Triage Vitals  Encounter Vitals Group     BP 05/16/23 0926 (!) 165/82     Systolic BP Percentile --      Diastolic BP Percentile --      Pulse Rate 05/16/23 0926 71     Resp 05/16/23 0926 18     Temp 05/16/23 0926 98.8 F (37.1 C)     Temp src --      SpO2 05/16/23 0926 99 %     Weight --      Height --      Head Circumference --      Peak Flow --      Pain Score 05/16/23 0929 5     Pain Loc --      Pain Education --      Exclude from Growth Chart --    No data found.  Updated Vital Signs BP (!) 165/82   Pulse 71   Temp 98.8 F (37.1 C)   Resp 18   SpO2 99%       Physical Exam Constitutional:      General: She is not in acute distress.    Appearance: She is well-developed.     Comments: Overweight.  Appears uncomfortable  HENT:     Head: Normocephalic and atraumatic.  Eyes:     Conjunctiva/sclera: Conjunctivae normal.      Pupils: Pupils are equal, round, and reactive to light.  Cardiovascular:     Rate and Rhythm: Normal rate.  Pulmonary:     Effort: Pulmonary effort is normal. No respiratory distress.    Abdominal:     General: There is no distension.     Palpations: Abdomen is soft.  Musculoskeletal:        General: Normal range of motion.     Cervical back: Normal range of motion.  Skin:    General: Skin is warm and dry.  Neurological:     Mental Status: She is alert.      UC Treatments / Results  Labs (all labs ordered are listed, but only abnormal results are displayed) Labs Reviewed - No data to display  EKG   Radiology No results found.  Procedures Procedures (including critical care time)  Medications Ordered in UC Medications - No data to display  Initial Impression / Assessment and Plan / UC Course  I have reviewed the triage vital signs and the nursing notes.  Pertinent labs & imaging results that were available during my care of the patient were reviewed by me and considered in my medical decision making (see chart for details).     Final Clinical Impressions(s) / UC Diagnoses   Final diagnoses:  Strain of left rhomboid muscle     Discharge Instructions      May take over-the-counter medicines for pain.  Tylenol , ibuprofen, or naproxen may help Use ice or heat to the area I have prescribed Skelaxin to take if needed.  This is a mild muscle relaxer To your doctor if not better by next week   ED Prescriptions     Medication Sig Dispense Auth. Provider   metaxalone (SKELAXIN) 800 MG tablet Take 1 tablet (800 mg total) by mouth 3 (three) times daily. 21 tablet Nicholette Barley,  Cleola Dach, MD      PDMP not reviewed this encounter.   Stephany Ehrich, MD 05/16/23 [phone]

## 2023-06-07 ENCOUNTER — Encounter: Payer: Self-pay | Admitting: Family Medicine

## 2023-06-07 ENCOUNTER — Ambulatory Visit: Payer: BC Managed Care – PPO | Admitting: Family Medicine

## 2023-06-07 VITALS — BP 120/56 | HR 68 | Ht 66.0 in | Wt 219.0 lb

## 2023-06-07 DIAGNOSIS — E781 Pure hyperglyceridemia: Secondary | ICD-10-CM | POA: Diagnosis not present

## 2023-06-07 DIAGNOSIS — E119 Type 2 diabetes mellitus without complications: Secondary | ICD-10-CM

## 2023-06-07 DIAGNOSIS — I10 Essential (primary) hypertension: Secondary | ICD-10-CM

## 2023-06-07 DIAGNOSIS — E785 Hyperlipidemia, unspecified: Secondary | ICD-10-CM | POA: Diagnosis not present

## 2023-06-07 DIAGNOSIS — E1169 Type 2 diabetes mellitus with other specified complication: Secondary | ICD-10-CM | POA: Diagnosis not present

## 2023-06-07 LAB — POCT GLYCOSYLATED HEMOGLOBIN (HGB A1C): Hemoglobin A1C: 6.4 % — AB (ref 4.0–5.6)

## 2023-06-07 NOTE — Addendum Note (Signed)
 Addended by: Izora Marten on: 06/07/2023 09:51 AM   Modules accepted: Orders

## 2023-06-07 NOTE — Assessment & Plan Note (Signed)
 A1c looks great today at 6.4 she has made some great progress in bringing that down from 7.0 she had really been working on her diet lately in part to really improve her cholesterol numbers but I think it has also improved her A1c.  She actually has been working out pretty regularly and she is also been doing some Emergency planning/management officer.

## 2023-06-07 NOTE — Progress Notes (Signed)
   Established Patient Office Visit  Subjective  Patient ID: Donna Dyer, female    DOB: 1960-10-02  Age: 63 y.o. MRN: [id number]  Chief Complaint  Patient presents with   Diabetes    HPI  Diabetes - no hypoglycemic events. No wounds or sores that are not healing well. No increased thirst or urination. Checking glucose at home. Taking medications as prescribed without any side effects.  Was hoping to lose a little bit more weight especially with adding in the exercise component and cutting out fried food she is considering significantly cutting back on carb intake.    ROS    Objective:     BP (!) 120/56   Pulse 68   Ht 5\' 6"  (1.676 m)   Wt 219 lb (99.3 kg)   SpO2 99%   BMI 35.35 kg/m    Physical Exam Vitals and nursing note reviewed.  Constitutional:      Appearance: Normal appearance.  HENT:     Head: Normocephalic and atraumatic.  Eyes:     Conjunctiva/sclera: Conjunctivae normal.  Cardiovascular:     Rate and Rhythm: Normal rate and regular rhythm.  Pulmonary:     Effort: Pulmonary effort is normal.     Breath sounds: Normal breath sounds.  Skin:    General: Skin is warm and dry.  Neurological:     Mental Status: She is alert.  Psychiatric:        Mood and Affect: Mood normal.      Results for orders placed or performed in visit on 06/07/23  POCT HgB A1C  Result Value Ref Range   Hemoglobin A1C 6.4 (A) 4.0 - 5.6 %   HbA1c POC (<> result, manual entry)     HbA1c, POC (prediabetic range)     HbA1c, POC (controlled diabetic range)        The 10-year ASCVD risk score (Arnett DK, et al., 2019) is: 20.6%    Assessment & Plan:   Problem List Items Addressed This Visit       Cardiovascular and Mediastinum   Essential hypertension, benign   Well controlled. Continue current regimen. Follow up in  6 months.       Relevant Orders   Lipid Panel With LDL/HDL Ratio   CMP14+EGFR     Endocrine   Hyperlipidemia associated with type 2 diabetes  mellitus (HCC)   Relevant Orders   Lipid Panel With LDL/HDL Ratio   CMP14+EGFR   Diabetes mellitus type 2, controlled, without complications (HCC) - Primary   A1c looks great today at 6.4 she has made some great progress in bringing that down from 7.0 she had really been working on her diet lately in part to really improve her cholesterol numbers but I think it has also improved her A1c.  She actually has been working out pretty regularly and she is also been doing some Emergency planning/management officer.      Relevant Orders   POCT HgB A1C (Completed)   Lipid Panel With LDL/HDL Ratio   CMP14+EGFR     Other   Hypertriglyceridemia   She has not tried the [place] statin yet but would like to recheck her levels after she has been working pretty diligently on her diet for about the last 4 months.       Return in about 4 months (around 10/08/2023) for Diabetes follow-up, Hypertension.    Duaine German, MD

## 2023-06-07 NOTE — Assessment & Plan Note (Signed)
 Well controlled. Continue current regimen. Follow up in  6 months.

## 2023-06-07 NOTE — Assessment & Plan Note (Addendum)
 She has not tried the Pitavastatin  yet but would like to recheck her levels after she has been working pretty diligently on her diet for about the last 4 months.

## 2023-06-08 ENCOUNTER — Ambulatory Visit: Payer: Self-pay | Admitting: Family Medicine

## 2023-06-08 LAB — CMP14+EGFR
ALT: 14 IU/L (ref 0–32)
AST: 17 IU/L (ref 0–40)
Albumin: 4.4 g/dL (ref 3.9–4.9)
Alkaline Phosphatase: 132 IU/L — ABNORMAL HIGH (ref 44–121)
BUN/Creatinine Ratio: 15 (ref 12–28)
BUN: 20 mg/dL (ref 8–27)
Bilirubin Total: 0.4 mg/dL (ref 0.0–1.2)
CO2: 19 mmol/L — ABNORMAL LOW (ref 20–29)
Calcium: 9.6 mg/dL (ref 8.7–10.3)
Chloride: 103 mmol/L (ref 96–106)
Creatinine, Ser: 1.33 mg/dL — ABNORMAL HIGH (ref 0.57–1.00)
Globulin, Total: 3.8 g/dL (ref 1.5–4.5)
Glucose: 110 mg/dL — ABNORMAL HIGH (ref 70–99)
Potassium: 4.5 mmol/L (ref 3.5–5.2)
Sodium: 140 mmol/L (ref 134–144)
Total Protein: 8.2 g/dL (ref 6.0–8.5)
eGFR: 45 mL/min/{1.73_m2} — ABNORMAL LOW (ref >59)

## 2023-06-08 LAB — LDL CHOLESTEROL, DIRECT: LDL Direct: 84 mg/dL (ref 0–99)

## 2023-06-08 NOTE — Progress Notes (Signed)
 Hi Shaylah, kidney function is stable at 1.3 that is where you have been over the last year or so rock steady which is great.  Alk phos is still just a little elevated similar to last time but the AST and ALT are also normal.    Please call lab and see if we can have them add a lipid panel they did a direct LDL but actually wanted a lipid panel and an LDL.  Need to also take a look at the triglyceride level.

## 2023-06-09 LAB — LIPID PANEL WITH LDL/HDL RATIO
Cholesterol, Total: 209 mg/dL — ABNORMAL HIGH (ref 100–199)
HDL: 44 mg/dL (ref >39)
LDL Chol Calc (NIH): 115 mg/dL — ABNORMAL HIGH (ref 0–99)
LDL/HDL Ratio: 2.6 ratio (ref 0.0–3.2)
Triglycerides: 291 mg/dL — ABNORMAL HIGH (ref 0–149)
VLDL Cholesterol Cal: 50 mg/dL — ABNORMAL HIGH (ref 5–40)

## 2023-06-09 LAB — SPECIMEN STATUS REPORT

## 2023-06-09 NOTE — Progress Notes (Signed)
 Hi Donna Dyer, LDL cholesterol is elevated as well as triglycerides.  Right now your cardiovascular risk for heart attack or stroke in the next 10 years is around 18%.  If you want to try the PET-avid statin once week we certainly can I think we could also look at some of the injectable options such as Repatha.  I am not sure if you are familiar they are not statins it is a newer class of cholesterol-lowering drugs for people who are at higher risk for cardiovascular disease.  There are usually given monthly.  If it something you be open to trying we could verify with the insurance and try to get it covered since you have been intolerant to multiple statins.  The 10-year ASCVD risk score (Arnett DK, et al., 2019) is: 18.6%   Values used to calculate the score:     Age: 63 years     Sex: Female     Is Non-Hispanic African American: Yes     Diabetic: Yes     Tobacco smoker: No     Systolic Blood Pressure: 120 mmHg     Is BP treated: Yes     HDL Cholesterol: 44 mg/dL     Total Cholesterol: 209 mg/dL

## 2023-06-21 DIAGNOSIS — Z01419 Encounter for gynecological examination (general) (routine) without abnormal findings: Secondary | ICD-10-CM | POA: Diagnosis not present

## 2023-06-21 DIAGNOSIS — Z6836 Body mass index (BMI) 36.0-36.9, adult: Secondary | ICD-10-CM | POA: Diagnosis not present

## 2023-06-21 DIAGNOSIS — Z1272 Encounter for screening for malignant neoplasm of vagina: Secondary | ICD-10-CM | POA: Diagnosis not present

## 2023-07-18 DIAGNOSIS — D472 Monoclonal gammopathy: Secondary | ICD-10-CM | POA: Diagnosis not present

## 2023-07-26 DIAGNOSIS — I10 Essential (primary) hypertension: Secondary | ICD-10-CM

## 2023-07-26 DIAGNOSIS — E119 Type 2 diabetes mellitus without complications: Secondary | ICD-10-CM

## 2023-07-26 MED ORDER — LOSARTAN POTASSIUM 100 MG PO TABS: 100.0000 mg | ORAL_TABLET | Freq: Every day | ORAL | 3 refills | Status: AC

## 2023-07-26 MED ORDER — SPIRONOLACTONE 25 MG PO TABS: 12.5000 mg | ORAL_TABLET | Freq: Every day | ORAL | 3 refills | Status: AC

## 2023-07-26 MED ORDER — NEBIVOLOL HCL 20 MG PO TABS: 1.0000 | ORAL_TABLET | Freq: Every day | ORAL | 3 refills | Status: AC

## 2023-07-26 MED ORDER — VERAPAMIL HCL ER 180 MG PO TBCR: 90.0000 mg | EXTENDED_RELEASE_TABLET | Freq: Every day | ORAL | 3 refills | Status: AC

## 2023-10-09 ENCOUNTER — Encounter: Payer: Self-pay | Admitting: Family Medicine

## 2023-10-09 ENCOUNTER — Ambulatory Visit (INDEPENDENT_AMBULATORY_CARE_PROVIDER_SITE_OTHER): Admitting: Family Medicine

## 2023-10-09 VITALS — BP 125/78 | HR 66 | Ht 66.0 in | Wt 226.5 lb

## 2023-10-09 DIAGNOSIS — E1169 Type 2 diabetes mellitus with other specified complication: Secondary | ICD-10-CM | POA: Diagnosis not present

## 2023-10-09 DIAGNOSIS — Z23 Encounter for immunization: Secondary | ICD-10-CM

## 2023-10-09 DIAGNOSIS — N1832 Chronic kidney disease, stage 3b: Secondary | ICD-10-CM

## 2023-10-09 DIAGNOSIS — D472 Monoclonal gammopathy: Secondary | ICD-10-CM

## 2023-10-09 DIAGNOSIS — T466X5A Adverse effect of antihyperlipidemic and antiarteriosclerotic drugs, initial encounter: Secondary | ICD-10-CM

## 2023-10-09 DIAGNOSIS — I10 Essential (primary) hypertension: Secondary | ICD-10-CM | POA: Diagnosis not present

## 2023-10-09 DIAGNOSIS — E119 Type 2 diabetes mellitus without complications: Secondary | ICD-10-CM

## 2023-10-09 DIAGNOSIS — Z9189 Other specified personal risk factors, not elsewhere classified: Secondary | ICD-10-CM

## 2023-10-09 DIAGNOSIS — G720 Drug-induced myopathy: Secondary | ICD-10-CM

## 2023-10-09 DIAGNOSIS — E785 Hyperlipidemia, unspecified: Secondary | ICD-10-CM | POA: Diagnosis not present

## 2023-10-09 DIAGNOSIS — E781 Pure hyperglyceridemia: Secondary | ICD-10-CM

## 2023-10-09 LAB — POCT GLYCOSYLATED HEMOGLOBIN (HGB A1C): Hemoglobin A1C: 6.7 % — AB (ref 4.0–5.6)

## 2023-10-09 NOTE — Assessment & Plan Note (Signed)
Well controlled. Continue current regimen. Follow up in  3-4 months.  

## 2023-10-09 NOTE — Assessment & Plan Note (Signed)
 Unfortunately didn't tolerate pitavastatin .   Due for urine micro today

## 2023-10-09 NOTE — Progress Notes (Unsigned)
 Established Patient Office Visit  Subjective  Patient ID: Donna Dyer, female    DOB: 11-28-1960  Age: 63 y.o. MRN: [id number]  Chief Complaint  Patient presents with   Diabetes    HPI  Discussed the use of AI scribe software for clinical note transcription with the patient, who gave verbal consent to proceed.  History of Present Illness Donna Dyer is a 63 year old female with diabetes who presents for follow-up of her blood sugar control.  Glycemic control - Hemoglobin A1c increased from 6.4% to 6.7% since last visit - Attributes rise in A1c to dietary indulgences during recent travel to [place] and the [place] for her birthday month - No significant hyperglycemia or hypoglycemia episodes - No issues with delayed wound healing  Myalgias and arthralgias - Muscle and joint aches described as 'almost like having the flu' - Symptoms associated with pitavastatin  use; similar side effects previously experienced with Lipitor and Crestor  - Discontinued pitavastatin  after one dose due to symptoms  Preventive care and vaccination coverage - Recently started Medicare in July - Experiencing confusion regarding coverage for vaccinations  Ophthalmologic care - Received vision exam in August - Completed medical eye exam in January  Cardiopulmonary symptoms - No recent chest pain - No shortness of breath    {History (Optional):23778}  ROS    Objective:     BP 125/78   Pulse 66   Ht 5' 6 (1.676 m)   Wt 226 lb 8 oz (102.7 kg)   SpO2 99%   BMI 36.56 kg/m  {Vitals History (Optional):23777}  Physical Exam Vitals and nursing note reviewed.  Constitutional:      Appearance: Normal appearance.  HENT:     Head: Normocephalic and atraumatic.  Eyes:     Conjunctiva/sclera: Conjunctivae normal.  Cardiovascular:     Rate and Rhythm: Normal rate and regular rhythm.  Pulmonary:     Effort: Pulmonary effort is normal.     Breath sounds: Normal breath sounds.   Skin:    General: Skin is warm and dry.  Neurological:     Mental Status: She is alert.  Psychiatric:        Mood and Affect: Mood normal.      Results for orders placed or performed in visit on 10/09/23  POCT HgB A1C  Result Value Ref Range   Hemoglobin A1C 6.7 (A) 4.0 - 5.6 %   HbA1c POC (<> result, manual entry)     HbA1c, POC (prediabetic range)     HbA1c, POC (controlled diabetic range)      {Labs (Optional):23779}  The 10-year ASCVD risk score (Arnett DK, et al., 2019) is: 21.2%    Assessment & Plan:   Problem List Items Addressed This Visit       Cardiovascular and Mediastinum   Essential hypertension, benign   Well controlled. Continue current regimen. Follow up in  3-4 months.         Endocrine   Hyperlipidemia associated with type 2 diabetes mellitus (HCC)   Unfortunately didn't tolerate pitavastatin .   Due for urine micro today       Relevant Orders   POCT HgB A1C (Completed)     Genitourinary   CKD stage G3b/A1, GFR 30-44 and albumin creatinine ratio <30 mg/g (HCC) - Primary   Recent serume Cr checked by Oncology. Stable.  Due for urine micro today       Relevant Orders   Urine Microalbumin w/creat. ratio   Assessment and Plan  Assessment & Plan Type 2 diabetes mellitus Hemoglobin A1c increased to 6.7%, indicating worsening glycemic control. No hypoglycemia, hyperglycemia, or non-healing wounds. She plans dietary improvements. - Encourage dietary modifications to improve glycemic control. - Perform urine test to check for excess protein.  Chronic kidney disease, stage 3b Annual urine test for proteinuria due to monitor kidney function. No symptoms of worsening kidney function. - Perform urine test to check for excess protein.  Hyperlipidemia (elevated LDL cholesterol and triglycerides), statin intolerance Intolerance to pitavastatin , Lipitor, and Crestor . Discussed alternative therapies including Zetia, Nexlizet, and injectables. Prefers  oral medications. - Obtain list of alternative medications from endocrinologist. - Consider starting Zetia or Nexlizet based on preference and insurance coverage. - Re-evaluate lipid levels after initiating new therapy.  Monoclonal gammopathy of undetermined significance (MGUS) MGUS stable. Follow-up with oncologist scheduled for January. - Continue monitoring with oncologist every six months.   No follow-ups on file.    Dorothyann Byars, MD

## 2023-10-09 NOTE — Assessment & Plan Note (Signed)
 Recent serume Cr checked by Oncology. Stable.  Due for urine micro today  No symptoms of worsening kidney function.

## 2023-10-10 ENCOUNTER — Ambulatory Visit: Payer: Self-pay | Admitting: Family Medicine

## 2023-10-10 DIAGNOSIS — G720 Drug-induced myopathy: Secondary | ICD-10-CM | POA: Insufficient documentation

## 2023-10-10 LAB — MICROALBUMIN / CREATININE URINE RATIO
Creatinine, Urine: 74.1 mg/dL
Microalb/Creat Ratio: <4 mg/g{creat} (ref 0–29)
Microalbumin, Urine: <3 ug/mL

## 2023-10-10 LAB — SPECIMEN STATUS REPORT

## 2023-10-10 MED ORDER — NEXLIZET 180-10 MG PO TABS: 1.0000 | ORAL_TABLET | Freq: Every day | ORAL | 3 refills | Status: DC

## 2023-10-10 NOTE — Assessment & Plan Note (Signed)
 Sxs on 3 different statins.

## 2023-10-10 NOTE — Assessment & Plan Note (Signed)
 Monoclonal gammopathy of undetermined significance (MGUS) MGUS stable. Follow-up with oncologist scheduled for January. - Continue monitoring with oncologist every six months.

## 2023-10-10 NOTE — Progress Notes (Signed)
No excess protein in the urine which is great.

## 2023-10-10 NOTE — Assessment & Plan Note (Signed)
 Trial of nexlizet since intolerant to statins.

## 2023-10-11 ENCOUNTER — Other Ambulatory Visit (HOSPITAL_COMMUNITY): Payer: Self-pay

## 2023-10-11 ENCOUNTER — Telehealth: Payer: Self-pay

## 2023-10-12 ENCOUNTER — Other Ambulatory Visit (HOSPITAL_COMMUNITY): Payer: Self-pay

## 2023-10-12 NOTE — Telephone Encounter (Signed)
 Pharmacy Patient Advocate Encounter  Received notification from [facility] that Prior Authorization for Nexlizet 180-10MG  tablets  has been APPROVED from 10/12/23 to 10/11/24. Ran test claim, Copay is $135. This test claim was processed through [facility]- copay amounts may vary at other pharmacies due to pharmacy/plan contracts, or as the patient moves through the different stages of their insurance plan.   PA #/Case ID/Reference #: [id number]

## 2023-12-05 ENCOUNTER — Encounter: Payer: Self-pay | Admitting: Family Medicine

## 2023-12-05 ENCOUNTER — Ambulatory Visit: Admitting: Family Medicine

## 2023-12-05 VITALS — BP 128/76 | HR 81 | Ht 66.0 in | Wt 226.0 lb

## 2023-12-05 DIAGNOSIS — E785 Hyperlipidemia, unspecified: Secondary | ICD-10-CM

## 2023-12-05 DIAGNOSIS — E1169 Type 2 diabetes mellitus with other specified complication: Secondary | ICD-10-CM

## 2023-12-05 DIAGNOSIS — Z9189 Other specified personal risk factors, not elsewhere classified: Secondary | ICD-10-CM

## 2023-12-05 DIAGNOSIS — L02411 Cutaneous abscess of right axilla: Secondary | ICD-10-CM | POA: Diagnosis not present

## 2023-12-05 DIAGNOSIS — G720 Drug-induced myopathy: Secondary | ICD-10-CM

## 2023-12-05 DIAGNOSIS — T466X5A Adverse effect of antihyperlipidemic and antiarteriosclerotic drugs, initial encounter: Secondary | ICD-10-CM

## 2023-12-05 DIAGNOSIS — E781 Pure hyperglyceridemia: Secondary | ICD-10-CM

## 2023-12-05 DIAGNOSIS — L02412 Cutaneous abscess of left axilla: Secondary | ICD-10-CM | POA: Diagnosis not present

## 2023-12-05 MED ORDER — NEXLIZET 180-10 MG PO TABS: 1.0000 | ORAL_TABLET | Freq: Every day | ORAL | 3 refills | Status: AC

## 2023-12-05 MED ORDER — DOXYCYCLINE HYCLATE 100 MG PO TABS: 100.0000 mg | ORAL_TABLET | Freq: Two times a day (BID) | ORAL | 0 refills | Status: AC

## 2023-12-05 NOTE — Progress Notes (Signed)
 Pt reports that she developed boils bilaterally in her armpits about 1 week ago. She stated that the R is worse. She has been using warm compresses.  Also she wanted to let Dr. Alvan know that she never picked up the Bempedoic Acid-Eztimibe 180-10 mg due to cost. She said that they wanted to dispense #90 and she said that she is willing to try the medication however she doesn't want to be stuck with a medication that she cannot take so she is asking if the amount of the tablets can be changed to see how she does on this.

## 2023-12-05 NOTE — Progress Notes (Signed)
 Acute Office Visit  Patient ID: Donna Dyer, female    DOB: 01/28/1960, 63 y.o.   MRN: [id number]  PCP: Alvan Dorothyann BIRCH, MD  Chief Complaint  Patient presents with   Recurrent Skin Infections    Subjective:     HPI  Pt reports that she developed boils bilaterally in her armpits about 1 week ago. She stated that the R is worse. She has been using warm compresses.   Also she wanted to let Dr. Alvan know that she never picked up the Bempedoic Acid-Eztimibe 180-10 mg due to cost. She said that they wanted to dispense #90 and she said that she is willing to try the medication however she doesn't want to be stuck with a medication that she cannot take so she is asking if the amount of the tablets can be changed to see how she does on this.     Discussed the use of AI scribe software for clinical note transcription with the patient, who gave verbal consent to proceed.  History of Present Illness Donna Dyer is a 63 year old female who presents with a painful abscess.  Localized abscess in both axilla 1 on the right is more painful than the left. - Painful abscess with progressive enlargement - Pain radiates towards the breast area from the right axilla. - No spontaneous drainage despite use of warm compresses - No drainage observed prior to visit - Malodor present at the site - Cleans area with antibacterial products - No application of pressure to the abscess  Antibiotic allergies - Allergic to azithromycin - No other antibiotic allergies reported  Abscess management history - No prior history of gauze placement in an abscess  She also is willing to try the next Lizette for her cholesterol but it is $100 for 90-day supply so she wants to be able to try it for 30 days before spending the full $100.   ROS     Objective:    BP 128/76   Pulse 81   Ht 5' 6 (1.676 m)   Wt 226 lb (102.5 kg)   SpO2 100%   BMI 36.48 kg/m    Physical Exam Skin:     Comments: To her right axilla she has a 1-1/2 x 3 cm oval-shaped raised abscess with a small amount of white drainage.  In the left axilla she has a slightly smaller lesion approximately 1 x 2 cm.  It has a pule in the center but no active drainage.       No results found for any visits on 12/05/23.     Assessment & Plan:   Problem List Items Addressed This Visit       Endocrine   Hyperlipidemia associated with type 2 diabetes mellitus (HCC)   Relevant Medications   Bempedoic Acid-Ezetimibe (NEXLIZET ) 180-10 MG TABS     Musculoskeletal and Integument   Statin myopathy   Relevant Medications   Bempedoic Acid-Ezetimibe (NEXLIZET ) 180-10 MG TABS     Other   Hypertriglyceridemia   Will send over a short prescription of the Nexlizet  to try       Relevant Medications   Bempedoic Acid-Ezetimibe (NEXLIZET ) 180-10 MG TABS   Other Visit Diagnoses       Abscess of right axilla    -  Primary   Relevant Orders   I & D   Anaerobic and Aerobic Culture     Abscess of left axilla  High risk of cardiac event       Relevant Medications   Bempedoic Acid-Ezetimibe (NEXLIZET ) 180-10 MG TABS       Assessment and Plan Assessment & Plan Cutaneous abscesses of right and left upper limbs Right abscess more inflamed and painful, requiring intervention for drainage. Left abscess less severe, managed with warm compresses. - Numbed with lidocaine  and nicked right abscess for drainage. - Inserted gauze into right abscess to maintain drainage. - Instructed to remove 0.75 inches of gauze daily. - Prescribed doxycycline  BID for infection control. - Continue warm compresses on left abscess. - Provided gauze for home drainage management.  Allergy to azithromycin Allergy requires alternative antibiotic therapy. - Prescribed doxycycline  as alternative.  I & D  Date/Time: 12/05/2023 10:28 AM  Performed by: Alvan Dorothyann BIRCH, MD Authorized by: Alvan Dorothyann BIRCH, MD   Consent:     Consent obtained:  Verbal   Consent given by:  Patient   Risks, benefits, and alternatives were discussed: yes     Alternatives discussed:  Observation Location:    Type:  Abscess   Size:  Right axilla, approx 1.5 x 3 cm Pre-procedure details:    Skin preparation:  Chlorhexidine Sedation:    Sedation type:  None Anesthesia:    Anesthesia method:  Local infiltration   Local anesthetic:  Lidocaine  1% w/o epi Procedure type:    Complexity:  Complex Procedure details:    Needle aspiration: no     Incision types:  Single straight   Incision depth:  Dermal   Scalpel blade:  11   Wound management:  Probed and deloculated   Drainage:  Purulent and bloody   Drainage amount:  Moderate   Wound treatment:  Drain placed   Packing materials:  1/4 in iodoform gauze Post-procedure details:    Procedure completion:  Tolerated well, no immediate complications    Meds ordered this encounter  Medications   doxycycline  (VIBRA -TABS) 100 MG tablet    Sig: Take 1 tablet (100 mg total) by mouth 2 (two) times daily.    Dispense:  14 tablet    Refill:  0   Bempedoic Acid-Ezetimibe (NEXLIZET ) 180-10 MG TABS    Sig: Take 1 tablet by mouth daily.    Dispense:  30 tablet    Refill:  3    Please run with coupon if available.    No follow-ups on file.  Dorothyann Alvan, MD [facility]

## 2023-12-05 NOTE — Assessment & Plan Note (Signed)
 Will send over a short prescription of the Nexlizet  to try

## 2023-12-11 ENCOUNTER — Ambulatory Visit: Payer: Self-pay | Admitting: Family Medicine

## 2023-12-11 NOTE — Progress Notes (Signed)
 Hi Donna Dyer, so far culture is not growing out any specific bacteria hopefully you are feeling better.

## 2023-12-12 LAB — ANAEROBIC AND AEROBIC CULTURE

## 2023-12-29 LAB — HM MAMMOGRAPHY

## 2024-02-08 ENCOUNTER — Ambulatory Visit: Admitting: Family Medicine

## 2024-02-08 DIAGNOSIS — E1169 Type 2 diabetes mellitus with other specified complication: Secondary | ICD-10-CM

## 2024-03-11 ENCOUNTER — Ambulatory Visit: Admitting: Family Medicine

## 2024-05-06 ENCOUNTER — Ambulatory Visit: Admitting: Cardiology
